# Patient Record
Sex: Male | Born: 1970 | Hispanic: Yes | Smoking: Former smoker
Health system: Southern US, Community
[De-identification: ages and names within clinical notes are randomized; demographics above are authoritative.]

## PROBLEM LIST (undated history)

## (undated) ENCOUNTER — Emergency Department (HOSPITAL_COMMUNITY): Payer: Self-pay | Source: Home / Self Care

## (undated) DIAGNOSIS — B999 Unspecified infectious disease: Secondary | ICD-10-CM

## (undated) DIAGNOSIS — M86272 Subacute osteomyelitis, left ankle and foot: Secondary | ICD-10-CM

## (undated) DIAGNOSIS — M869 Osteomyelitis, unspecified: Secondary | ICD-10-CM

## (undated) DIAGNOSIS — E119 Type 2 diabetes mellitus without complications: Secondary | ICD-10-CM

## (undated) HISTORY — DX: Subacute osteomyelitis, left ankle and foot: M86.272

---
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
## 2002-05-15 ENCOUNTER — Encounter: Payer: Self-pay | Admitting: *Deleted

## 2002-05-15 ENCOUNTER — Emergency Department (HOSPITAL_COMMUNITY): Admission: EM | Admit: 2002-05-15 | Discharge: 2002-05-16 | Payer: Self-pay | Admitting: Emergency Medicine

## 2002-06-25 ENCOUNTER — Encounter: Payer: Self-pay | Admitting: Chiropractor

## 2002-06-25 ENCOUNTER — Ambulatory Visit (HOSPITAL_COMMUNITY): Admission: RE | Admit: 2002-06-25 | Discharge: 2002-06-25 | Payer: Self-pay | Admitting: Chiropractor

## 2003-06-05 ENCOUNTER — Emergency Department (HOSPITAL_COMMUNITY): Admission: EM | Admit: 2003-06-05 | Discharge: 2003-06-05 | Payer: Self-pay | Admitting: Emergency Medicine

## 2018-07-29 ENCOUNTER — Ambulatory Visit (HOSPITAL_COMMUNITY)
Admission: EM | Admit: 2018-07-29 | Discharge: 2018-07-29 | Disposition: A | Discharge status: Home / Self Care | Payer: Self-pay | Attending: Family Medicine | Admitting: Family Medicine

## 2018-07-29 ENCOUNTER — Other Ambulatory Visit: Payer: Self-pay

## 2018-07-29 ENCOUNTER — Encounter (HOSPITAL_COMMUNITY): Payer: Self-pay

## 2018-07-29 ENCOUNTER — Ambulatory Visit (INDEPENDENT_AMBULATORY_CARE_PROVIDER_SITE_OTHER): Payer: Self-pay

## 2018-07-29 ENCOUNTER — Emergency Department (HOSPITAL_COMMUNITY): Payer: Medicaid Other

## 2018-07-29 ENCOUNTER — Inpatient Hospital Stay (HOSPITAL_COMMUNITY)
Admission: EM | Admit: 2018-07-29 | Discharge: 2018-08-01 | DRG: 617 | Disposition: A | Discharge status: Home Health | Payer: Medicaid Other | Attending: Internal Medicine | Admitting: Internal Medicine

## 2018-07-29 DIAGNOSIS — Z599 Problem related to housing and economic circumstances, unspecified: Secondary | ICD-10-CM | POA: Diagnosis not present

## 2018-07-29 DIAGNOSIS — E1165 Type 2 diabetes mellitus with hyperglycemia: Secondary | ICD-10-CM | POA: Diagnosis present

## 2018-07-29 DIAGNOSIS — Z833 Family history of diabetes mellitus: Secondary | ICD-10-CM | POA: Diagnosis not present

## 2018-07-29 DIAGNOSIS — E1169 Type 2 diabetes mellitus with other specified complication: Secondary | ICD-10-CM

## 2018-07-29 DIAGNOSIS — L97529 Non-pressure chronic ulcer of other part of left foot with unspecified severity: Secondary | ICD-10-CM

## 2018-07-29 DIAGNOSIS — Z794 Long term (current) use of insulin: Secondary | ICD-10-CM | POA: Diagnosis not present

## 2018-07-29 DIAGNOSIS — L97509 Non-pressure chronic ulcer of other part of unspecified foot with unspecified severity: Secondary | ICD-10-CM

## 2018-07-29 DIAGNOSIS — E11621 Type 2 diabetes mellitus with foot ulcer: Secondary | ICD-10-CM

## 2018-07-29 DIAGNOSIS — L0390 Cellulitis, unspecified: Secondary | ICD-10-CM

## 2018-07-29 DIAGNOSIS — M869 Osteomyelitis, unspecified: Secondary | ICD-10-CM | POA: Diagnosis present

## 2018-07-29 DIAGNOSIS — E1142 Type 2 diabetes mellitus with diabetic polyneuropathy: Secondary | ICD-10-CM | POA: Diagnosis present

## 2018-07-29 HISTORY — DX: Type 2 diabetes mellitus without complications: E11.9

## 2018-07-29 HISTORY — DX: Osteomyelitis, unspecified: M86.9

## 2018-07-29 LAB — CBC WITH DIFFERENTIAL/PLATELET
Abs Immature Granulocytes: 0.03 10*3/uL (ref 0.00–0.07)
Basophils Absolute: 0 10*3/uL (ref 0.0–0.1)
Basophils Relative: 0 %
Eosinophils Absolute: 0.1 10*3/uL (ref 0.0–0.5)
Eosinophils Relative: 2 %
HCT: 42.1 % (ref 39.0–52.0)
Hemoglobin: 14 g/dL (ref 13.0–17.0)
Immature Granulocytes: 0 %
Lymphocytes Relative: 16 %
Lymphs Abs: 1.3 10*3/uL (ref 0.7–4.0)
MCH: 29.9 pg (ref 26.0–34.0)
MCHC: 33.3 g/dL (ref 30.0–36.0)
MCV: 89.8 fL (ref 80.0–100.0)
Monocytes Absolute: 0.6 10*3/uL (ref 0.1–1.0)
Monocytes Relative: 8 %
Neutro Abs: 5.8 10*3/uL (ref 1.7–7.7)
Neutrophils Relative %: 74 %
PLATELETS: 251 10*3/uL (ref 150–400)
RBC: 4.69 MIL/uL (ref 4.22–5.81)
RDW: 11.9 % (ref 11.5–15.5)
WBC: 7.8 10*3/uL (ref 4.0–10.5)
nRBC: 0 % (ref 0.0–0.2)

## 2018-07-29 LAB — URINALYSIS, ROUTINE W REFLEX MICROSCOPIC
Bilirubin Urine: NEGATIVE
Glucose, UA: >=500 mg/dL — AB
Hgb urine dipstick: NEGATIVE
Ketones, ur: NEGATIVE mg/dL
Leukocytes,Ua: NEGATIVE
Nitrite: NEGATIVE
Protein, ur: NEGATIVE mg/dL
Specific Gravity, Urine: 1.031 — ABNORMAL HIGH (ref 1.005–1.030)
pH: 6 (ref 5.0–8.0)

## 2018-07-29 LAB — COMPREHENSIVE METABOLIC PANEL
ALT: 17 U/L (ref 0–44)
AST: 14 U/L — AB (ref 15–41)
Albumin: 3.9 g/dL (ref 3.5–5.0)
Alkaline Phosphatase: 88 U/L (ref 38–126)
Anion gap: 9 (ref 5–15)
BUN: 9 mg/dL (ref 6–20)
CO2: 27 mmol/L (ref 22–32)
CREATININE: 0.58 mg/dL — AB (ref 0.61–1.24)
Calcium: 9.2 mg/dL (ref 8.9–10.3)
Chloride: 101 mmol/L (ref 98–111)
GFR calc Af Amer: >60 mL/min (ref >60)
GFR calc non Af Amer: >60 mL/min (ref >60)
Glucose, Bld: 172 mg/dL — ABNORMAL HIGH (ref 70–99)
Potassium: 3.8 mmol/L (ref 3.5–5.1)
Sodium: 137 mmol/L (ref 135–145)
Total Bilirubin: 0.4 mg/dL (ref 0.3–1.2)
Total Protein: 8 g/dL (ref 6.5–8.1)

## 2018-07-29 LAB — HEMOGLOBIN A1C
Hgb A1c MFr Bld: 11.1 % — ABNORMAL HIGH (ref 4.8–5.6)
Mean Plasma Glucose: 271.87 mg/dL

## 2018-07-29 LAB — GLUCOSE, CAPILLARY
Glucose-Capillary: 147 mg/dL — ABNORMAL HIGH (ref 70–99)
Glucose-Capillary: 185 mg/dL — ABNORMAL HIGH (ref 70–99)

## 2018-07-29 LAB — CBG MONITORING, ED: Glucose-Capillary: 139 mg/dL — ABNORMAL HIGH (ref 70–99)

## 2018-07-29 MED ORDER — ONDANSETRON HCL 4 MG/2ML IJ SOLN
4.0000 mg | Freq: Four times a day (QID) | INTRAMUSCULAR | Status: DC | PRN
  Administered 2018-07-31: 4 mg via INTRAVENOUS

## 2018-07-29 MED ORDER — ACETAMINOPHEN 325 MG PO TABS: 650.0000 mg | ORAL_TABLET | Freq: Four times a day (QID) | ORAL | Status: DC | PRN

## 2018-07-29 MED ORDER — POLYETHYLENE GLYCOL 3350 17 G PO PACK: 17.0000 g | PACK | Freq: Every day | ORAL | Status: DC | PRN

## 2018-07-29 MED ORDER — ONDANSETRON HCL 4 MG PO TABS: 4.0000 mg | ORAL_TABLET | Freq: Four times a day (QID) | ORAL | Status: DC | PRN

## 2018-07-29 MED ORDER — LIVING WELL WITH DIABETES BOOK - IN SPANISH: Freq: Once | Status: DC

## 2018-07-29 MED ORDER — ACETAMINOPHEN 650 MG RE SUPP: 650.0000 mg | Freq: Four times a day (QID) | RECTAL | Status: DC | PRN

## 2018-07-29 MED ORDER — FLUTICASONE PROPIONATE 50 MCG/ACT NA SUSP
2.0000 | Freq: Every day | NASAL | Status: DC
  Administered 2018-07-29: 2 via NASAL
  Filled 2018-07-29: qty 16

## 2018-07-29 MED ORDER — ENOXAPARIN SODIUM 40 MG/0.4ML ~~LOC~~ SOLN
40.0000 mg | SUBCUTANEOUS | Status: DC
  Administered 2018-07-29 – 2018-07-31 (×3): 40 mg via SUBCUTANEOUS
  Filled 2018-07-29 (×3): qty 0.4

## 2018-07-29 MED ORDER — INSULIN ASPART 100 UNIT/ML ~~LOC~~ SOLN
0.0000 [IU] | Freq: Every day | SUBCUTANEOUS | Status: DC
  Administered 2018-07-30 – 2018-07-31 (×2): 2 [IU] via SUBCUTANEOUS

## 2018-07-29 MED ORDER — INSULIN ASPART 100 UNIT/ML ~~LOC~~ SOLN
0.0000 [IU] | Freq: Three times a day (TID) | SUBCUTANEOUS | Status: DC
  Administered 2018-07-29: 2 [IU] via SUBCUTANEOUS
  Administered 2018-07-30: 15 [IU] via SUBCUTANEOUS
  Administered 2018-07-30: 2 [IU] via SUBCUTANEOUS
  Administered 2018-07-30 – 2018-07-31 (×2): 5 [IU] via SUBCUTANEOUS
  Administered 2018-07-31: 2 [IU] via SUBCUTANEOUS
  Administered 2018-08-01 (×2): 3 [IU] via SUBCUTANEOUS

## 2018-07-29 NOTE — ED Notes (Signed)
Pt request through interpretor that he see a Child psychotherapist. Will inform same.

## 2018-07-29 NOTE — ED Notes (Signed)
Unable to palpate DP pulse on left foot. Able to obtain DP with doppler. Tib palpable on left

## 2018-07-29 NOTE — ED Triage Notes (Signed)
Pt arrives and c/o infection at left great toe. Sent from UC with xray showing osteomylitis.

## 2018-07-29 NOTE — ED Notes (Signed)
VAs [place] at  Bedside

## 2018-07-29 NOTE — ED Notes (Signed)
ED TO INPATIENT HANDOFF REPORT  ED Nurse Name and Phone #: Aldean Jewett [phone]  S Name/Age/Gender Jon House 48 y.o. male Room/Bed: 021C/021C  Code Status   Code Status: Not on file  Home/SNF/Other Home Patient oriented to: self, place, time and situation Is this baseline? Yes   Triage Complete: Triage complete  Chief Complaint foot ulcer/diabetic/sent by dr  Triage Note Pt arrives and c/o infection at left great toe. Sent from UC with xray showing osteomylitis.   Allergies No Known Allergies  Level of Care/Admitting Diagnosis ED Disposition    ED Disposition Condition Comment   Admit  The patient appears reasonably stabilized for admission considering the current resources, flow, and capabilities available in the ED at this time, and I doubt any other [facility] requiring further screening and/or treatment in the ED prior to admission is  present.       B Medical/Surgery History Past Medical History:  Diagnosis Date  . Diabetes mellitus without complication (HCC)    History reviewed. No pertinent surgical history.   A IV Location/Drains/Wounds Patient Lines/Drains/Airways Status   Active Line/Drains/Airways    Name:   Placement date:   Placement time:   Site:   Days:   Peripheral IV 07/29/18 Right Antecubital   07/29/18    1102    Antecubital   less than 1          Intake/Output Last 24 hours No intake or output data in the 24 hours ending 07/29/18 1335  Labs/Imaging Results for orders placed or performed during the hospital encounter of 07/29/18 (from the past 48 hour(s))  CBG monitoring, ED     Status: Abnormal   Collection Time: 07/29/18 10:45 AM  Result Value Ref Range   Glucose-Capillary 139 (H) 70 - 99 mg/dL   Comment 1 Notify RN    Comment 2 Document in Chart   CBC with Differential/Platelet     Status: None   Collection Time: 07/29/18 11:13 AM  Result Value Ref Range   WBC 7.8 4.0 - 10.5 K/uL   RBC 4.69 4.22 - 5.81 MIL/uL   Hemoglobin  14.0 13.0 - 17.0 g/dL   HCT 06.0 15.6 - 15.3 %   MCV 89.8 80.0 - 100.0 fL   MCH 29.9 26.0 - 34.0 pg   MCHC 33.3 30.0 - 36.0 g/dL   RDW 79.4 32.7 - 61.4 %   Platelets 251 150 - 400 K/uL   nRBC 0.0 0.0 - 0.2 %   Neutrophils Relative % 74 %   Neutro Abs 5.8 1.7 - 7.7 K/uL   Lymphocytes Relative 16 %   Lymphs Abs 1.3 0.7 - 4.0 K/uL   Monocytes Relative 8 %   Monocytes Absolute 0.6 0.1 - 1.0 K/uL   Eosinophils Relative 2 %   Eosinophils Absolute 0.1 0.0 - 0.5 K/uL   Basophils Relative 0 %   Basophils Absolute 0.0 0.0 - 0.1 K/uL   Immature Granulocytes 0 %   Abs Immature Granulocytes 0.03 0.00 - 0.07 K/uL    Comment: Performed at [facility] : [facility], [address] [place], [place] [address]  Comprehensive metabolic panel     Status: Abnormal   Collection Time: 07/29/18 11:13 AM  Result Value Ref Range   Sodium 137 135 - 145 mmol/L   Potassium 3.8 3.5 - 5.1 mmol/L   Chloride 101 98 - 111 mmol/L   CO2 27 22 - 32 mmol/L   Glucose, Bld 172 (H) 70 - 99 mg/dL   BUN  9 6 - 20 mg/dL   Creatinine, Ser 0.25 (L) 0.61 - 1.24 mg/dL   Calcium 9.2 8.9 - 85.2 mg/dL   Total Protein 8.0 6.5 - 8.1 g/dL   Albumin 3.9 3.5 - 5.0 g/dL   AST 14 (L) 15 - 41 U/L   ALT 17 0 - 44 U/L   Alkaline Phosphatase 88 38 - 126 U/L   Total Bilirubin 0.4 0.3 - 1.2 mg/dL   GFR calc non Af Amer >60 >60 mL/min   GFR calc Af Amer >60 >60 mL/min   Anion gap 9 5 - 15    Comment: Performed at [facility], [address], [place], [place] [address]  Urinalysis, Routine w reflex microscopic     Status: Abnormal   Collection Time: 07/29/18 11:59 AM  Result Value Ref Range   Color, Urine YELLOW YELLOW   APPearance CLEAR CLEAR   Specific Gravity, Urine 1.031 (H) 1.005 - 1.030   pH 6.0 5.0 - 8.0   Glucose, UA >=500 (A) NEGATIVE mg/dL   Hgb urine dipstick NEGATIVE NEGATIVE   Bilirubin Urine NEGATIVE NEGATIVE   Ketones, ur NEGATIVE NEGATIVE mg/dL   Protein, ur NEGATIVE NEGATIVE mg/dL   Nitrite NEGATIVE NEGATIVE    Leukocytes,Ua NEGATIVE NEGATIVE   RBC / HPF 0-5 0 - 5 RBC/hpf   WBC, UA 0-5 0 - 5 WBC/hpf   Bacteria, UA RARE (A) NONE SEEN   Mucus PRESENT    Hyaline Casts, UA PRESENT     Comment: Performed at [facility], [address] [place], [place] [address]   Dg Foot Complete Left  Result Date: 07/29/2018 CLINICAL DATA:  Diabetic.  Ten ulceration EXAM: LEFT FOOT - COMPLETE 3+ VIEW COMPARISON:  None. FINDINGS: Severe cortical erosion involving a large portion of the distal phalanx first digit. The articular surface of the first distal phalanx does not appear involved. The proximal phalanx of the first digit appears intact without evidence of osseous erosion. Soft tissue ulceration at the tip of the first digit. IMPRESSION: Osteomyelitis of the distal phalanx first digit. Electronically Signed   By: Genevive Bi M.D.   On: 07/29/2018 10:01    Pending Labs Unresulted Labs (From admission, onward)   None      Vitals/Pain Today's Vitals   07/29/18 1300 07/29/18 1315 07/29/18 1330 07/29/18 1334  BP: 120/71 128/73 135/66   Pulse: 88 84 87   Resp:  18 16   Temp:      TempSrc:      SpO2: 97% 100% 100%   Weight:      Height:      PainSc:    6     Isolation Precautions No active isolations  Medications Medications - No data to display  Mobility walks Low fall risk   Focused Assessments musculoskelatal   R Recommendations: See Admitting Provider Note  Report given to:   Additional Notes:

## 2018-07-29 NOTE — ED Provider Notes (Signed)
[facility] EMERGENCY DEPARTMENT Provider Note   CSN: [id number] Arrival date & time: 07/29/18  1035    History   Chief Complaint Chief Complaint  Patient presents with  . Foot Pain    [name] is a 48 y.o. male.     Patient sent from urgent care with suspicion for right great toe osteomyelitis noted on x-ray.  Patient is Spanish-speaking.  Telephone interpreter used.  Patient states that he has had a wound on his toe for approximately 6 months.  He states that he cut it at one point and the wound healed.  The area has gradually become worse with more significant pain and swelling over the past 3 days.  Pain goes from the toe up to the ankle.  He denies any fevers, chest pain, shortness of breath.  No nausea or vomiting.  Patient states that he sees a clinic to get diabetes medications but the clinic is currently closed.  He is unable to tell me what medications he takes.  He denies drainage from the toe.  Onset of symptoms insidious.  Course is worsening.  Nothing makes symptoms better.     Past Medical History:  Diagnosis Date  . Diabetes mellitus without complication (HCC)     There are no active problems to display for this patient.   History reviewed. No pertinent surgical history.      Home Medications    Prior to Admission medications   Not on File    Family History Family History  Problem Relation Age of Onset  . Diabetes Mother     Social History Social History   Tobacco Use  . Smoking status: Never Smoker  . Smokeless tobacco: Never Used  Substance Use Topics  . Alcohol use: Yes    Comment: sometimes  . Drug use: Never     Allergies   Patient has no known allergies.   Review of Systems Review of Systems  Constitutional: Negative for fever.  HENT: Negative for rhinorrhea and sore throat.   Eyes: Negative for redness.  Respiratory: Negative for cough.   Cardiovascular: Positive for leg swelling.  Negative for chest pain.  Gastrointestinal: Negative for abdominal pain, diarrhea, nausea and vomiting.  Genitourinary: Negative for dysuria.  Musculoskeletal: Positive for arthralgias, joint swelling and myalgias.  Skin: Negative for rash.  Neurological: Negative for headaches.     Physical Exam Updated Vital Signs BP 129/87 (BP Location: Right Arm)   Pulse 91   Temp 98.5 F (36.9 C) (Oral)   Resp 18   Ht 5' 6.93" (1.7 m)   Wt 75.8 kg   SpO2 99%   BMI 26.23 kg/m   Physical Exam Vitals signs and nursing note reviewed.  Constitutional:      Appearance: He is well-developed.  HENT:     Head: Normocephalic and atraumatic.  Eyes:     General:        Right eye: No discharge.        Left eye: No discharge.     Conjunctiva/sclera: Conjunctivae normal.  Neck:     Musculoskeletal: Normal range of motion and neck supple.  Cardiovascular:     Rate and Rhythm: Normal rate and regular rhythm.     Pulses:          Dorsalis pedis pulses are 2+ on the right side and detected w/ Doppler on the left side.     Heart sounds: Normal heart sounds.  Pulmonary:  Effort: Pulmonary effort is normal.     Breath sounds: Normal breath sounds.  Abdominal:     Palpations: Abdomen is soft.     Tenderness: There is no abdominal tenderness.  Skin:    General: Skin is warm and dry.     Comments: Patient with irregular ulceration to the end of the left great toe.  It is dry at the current point.  There is swelling of the great toe and foot extending onto the ankle.  Skin is warm.  Neurological:     Mental Status: He is alert.      ED Treatments / Results  Labs (all labs ordered are listed, but only abnormal results are displayed) Labs Reviewed  COMPREHENSIVE METABOLIC PANEL - Abnormal; Notable for the following components:      Result Value   Glucose, Bld 172 (*)    Creatinine, Ser 0.58 (*)    AST 14 (*)    All other components within normal limits  URINALYSIS, ROUTINE W REFLEX  MICROSCOPIC - Abnormal; Notable for the following components:   Specific Gravity, Urine 1.031 (*)    Glucose, UA >=500 (*)    Bacteria, UA RARE (*)    All other components within normal limits  CBG MONITORING, ED - Abnormal; Notable for the following components:   Glucose-Capillary 139 (*)    All other components within normal limits  CBC WITH DIFFERENTIAL/PLATELET    EKG None  Radiology Dg Foot Complete Left  Result Date: 07/29/2018 CLINICAL DATA:  Diabetic.  Ten ulceration EXAM: LEFT FOOT - COMPLETE 3+ VIEW COMPARISON:  None. FINDINGS: Severe cortical erosion involving a large portion of the distal phalanx first digit. The articular surface of the first distal phalanx does not appear involved. The proximal phalanx of the first digit appears intact without evidence of osseous erosion. Soft tissue ulceration at the tip of the first digit. IMPRESSION: Osteomyelitis of the distal phalanx first digit. Electronically Signed   By: Genevive Bi M.D.   On: 07/29/2018 10:01    Procedures Procedures (including critical care time)  Medications Ordered in ED Medications - No data to display   Initial Impression / Assessment and Plan / ED Course  I have reviewed the triage vital signs and the nursing notes.  Pertinent labs & imaging results that were available during my care of the patient were reviewed by me and considered in my medical decision making (see chart for details).        Patient seen and examined. Work-up initiated.    Vital signs reviewed and are as follows: BP 129/87 (BP Location: Right Arm)   Pulse 91   Temp 98.5 F (36.9 C) (Oral)   Resp 18   Ht 5' 6.93" (1.7 m)   Wt 75.8 kg   SpO2 99%   BMI 26.23 kg/m   Spoke with Ortho PA Tinnie Gens who will see patient.   Plan on unassigned admission when labs return.  Discussed patient with Dr. Criss Alvine.  X-ray personally reviewed.  12:35 PM Spoke with IMTS who will see patient.   Final Clinical Impressions(s) / ED  Diagnoses   Final diagnoses:  Osteomyelitis of great toe of left foot (HCC)   Admit for osteomyelitis and work-up, IM and ortho reccs.   ED Discharge Orders    None       Renne Crigler, Cordelia Poche 07/29/18 1236    Pricilla Loveless, MD 07/29/18 [phone]

## 2018-07-29 NOTE — ED Notes (Signed)
Attempted to call report

## 2018-07-29 NOTE — Consult Note (Signed)
Reason for Consult:Right great toe osteo Referring Physician: S Venson House is an 48 y.o. male.  HPI: Jon House comes to the ED with a 3d hx/o right great toe pain and swelling. He has been dealing with an ulceration for 5-6 months. He denies fevers, chills, sweats, N/V, or previous similar e/o. He is diabetic and his CBG'Jon run in the upper 100'Jon. He is Spanish-speaking and visit conducted with interpreter.  Past Medical History:  Diagnosis Date  . Diabetes mellitus without complication (HCC)     History reviewed. No pertinent surgical history.  Family History  Problem Relation Age of Onset  . Diabetes Mother     Social History:  reports that he has never smoked. He has never used smokeless tobacco. He reports current alcohol use. He reports that he does not use drugs.  Allergies: No Known Allergies  Medications: I have reviewed the patient'Jon current medications.  Results for orders placed or performed during the hospital encounter of 07/29/18 (from the past 48 hour(Jon))  CBG monitoring, ED     Status: Abnormal   Collection Time: 07/29/18 10:45 AM  Result Value Ref Range   Glucose-Capillary 139 (H) 70 - 99 mg/dL   Comment 1 Notify RN    Comment 2 Document in Chart   CBC with Differential/Platelet     Status: None   Collection Time: 07/29/18 11:13 AM  Result Value Ref Range   WBC 7.8 4.0 - 10.5 K/uL   RBC 4.69 4.22 - 5.81 MIL/uL   Hemoglobin 14.0 13.0 - 17.0 g/dL   HCT 76.1 95.0 - 93.2 %   MCV 89.8 80.0 - 100.0 fL   MCH 29.9 26.0 - 34.0 pg   MCHC 33.3 30.0 - 36.0 g/dL   RDW 67.1 24.5 - 80.9 %   Platelets 251 150 - 400 K/uL   nRBC 0.0 0.0 - 0.2 %   Neutrophils Relative % 74 %   Neutro Abs 5.8 1.7 - 7.7 K/uL   Lymphocytes Relative 16 %   Lymphs Abs 1.3 0.7 - 4.0 K/uL   Monocytes Relative 8 %   Monocytes Absolute 0.6 0.1 - 1.0 K/uL   Eosinophils Relative 2 %   Eosinophils Absolute 0.1 0.0 - 0.5 K/uL   Basophils Relative 0 %   Basophils Absolute 0.0 0.0 -  0.1 K/uL   Immature Granulocytes 0 %   Abs Immature Granulocytes 0.03 0.00 - 0.07 K/uL    Comment: Performed at [facility], [address], [place], [place] [address]    Dg Foot Complete Left  Result Date: 07/29/2018 CLINICAL DATA:  Diabetic.  Ten ulceration EXAM: LEFT FOOT - COMPLETE 3+ VIEW COMPARISON:  None. FINDINGS: Severe cortical erosion involving a large portion of the distal phalanx first digit. The articular surface of the first distal phalanx does not appear involved. The proximal phalanx of the first digit appears intact without evidence of osseous erosion. Soft tissue ulceration at the tip of the first digit. IMPRESSION: Osteomyelitis of the distal phalanx first digit. Electronically Signed   By: Genevive Bi M.D.   On: 07/29/2018 10:01    Review of Systems  Constitutional: Negative for chills, fever and weight loss.  HENT: Negative for ear discharge, ear pain, hearing loss and tinnitus.   Eyes: Negative for blurred vision, double vision, photophobia and pain.  Respiratory: Negative for cough, sputum production and shortness of breath.   Cardiovascular: Negative for chest pain.  Gastrointestinal: Negative for abdominal pain, nausea and vomiting.  Genitourinary: Negative for dysuria, flank pain, frequency and urgency.  Musculoskeletal: Positive for joint pain (Right foot). Negative for back pain, falls, myalgias and neck pain.  Neurological: Negative for dizziness, tingling, sensory change, focal weakness, loss of consciousness and headaches.  Endo/Heme/Allergies: Does not bruise/bleed easily.  Psychiatric/Behavioral: Negative for depression, memory loss and substance abuse. The patient is not nervous/anxious.    Blood pressure 129/87, pulse 91, temperature 98.5 F (36.9 C), temperature source Oral, resp. rate 18, height 5' 6.93" (1.7 m), weight 75.8 kg, SpO2 99 %. Physical Exam  Constitutional: He appears well-developed and well-nourished. No distress.  HENT:   Head: Normocephalic and atraumatic.  Eyes: Conjunctivae are normal. Right eye exhibits no discharge. Left eye exhibits no discharge. No scleral icterus.  Neck: Normal range of motion.  Cardiovascular: Normal rate and regular rhythm.  Respiratory: Effort normal. No respiratory distress.  Musculoskeletal:     Comments: RLE No traumatic wounds, ecchymosis, or rash  Great toe fusiform edema, tip ulceration  No knee or ankle effusion  Knee stable to varus/ valgus and anterior/posterior stress  Sens DPN, SPN, TN intact  Motor EHL, ext, flex, evers 5/5  DP 0 (dopplerable), PT 2+, No significant edema  Neurological: He is alert.  Skin: Skin is warm and dry. He is not diaphoretic.  Psychiatric: He has a normal mood and affect. His behavior is normal.    Assessment/Plan: Right great toe osteo -- Will need amputation, likely Wednesday with Dr. Lajoyce Corners. Will get ABI'Jon, may need vascular input if impaired. DM    Freeman Caldron, PA-C Orthopedic Surgery [phone] 07/29/2018, 11:39 AM

## 2018-07-29 NOTE — ED Provider Notes (Addendum)
[facility] CARE [facility]   [phone] 07/29/18 Arrival Time: 3734  ASSESSMENT & PLAN:  1. Type 2 diabetes mellitus with foot ulcer, without long-term current use of insulin (HCC)   2. Diabetic osteomyelitis (HCC)    I have personally viewed the imaging studies ordered this visit. Bone erosion consistent with osteomyelitis.   I have offered to call vascular surgery to see if they can evaluation him promptly. After discussion, will send to the ED for evaluation and to consider admission. No insurance.   Reviewed expectations re: course of current medical issues. Questions answered. Outlined signs and symptoms indicating need for more acute intervention. Patient verbalized understanding. After Visit Summary given.  SUBJECTIVE: History from: patient. Video Spanish interpreter used.  Jon House is a 48 y.o. male with DM (does not know what medications he has been prescribed) who reports an "infection" of his L great toe. Has been present over several months he thinks. Reports cutting his toe several months ago; healed; "but infection has returned" this week. " Reports seeing another healthcare provider who placed him on PCN. Has been taking without change in his symptoms. No significant pain. Occasional and slight drainage from toe. Ambulatory without difficulty. Aggravating factors: none identified. Alleviating factors: none identified. Associated symptoms: none reported.  History reviewed. No pertinent surgical history.   ROS: As per HPI.   OBJECTIVE:  Vitals:   07/29/18 0856  BP: 121/76  Resp: 18  Temp: 98.2 F (36.8 C)  TempSrc: Oral  SpO2: 99%  Weight: 75.8 kg    General appearance: alert; no distress Extremities:  LLE: great toe is swollen and soft; missing nail; at tip of toe is an irregular, approx 1cm ulceration without active bleeding or drainage; no significant tenderness; very slight skin erythema over big toe; no foot swelling; great toe ROM: normal  without reported discomfort; otherwise L foot appears normal except for mild swelling when compared to R foot CV: brisk extremity capillary refill of RLE and LLE; 2+ DP pulse of RLE; I cannot palpate a DP pulse of LLE Skin: warm and dry; no visible rashes Neurologic: gait normal; normal reflexes of RLE and LLE; overall decreased distal sensation of RLE and LLE; normal strength of RLE and LLE Psychological: alert and cooperative; normal mood and affect  No Known Allergies  Past Medical History:  Diagnosis Date   Diabetes mellitus without complication (HCC)    Social History   Socioeconomic History   Marital status: Married    Spouse name: Not on file   Number of children: Not on file   Years of education: Not on file   Highest education level: Not on file  Occupational History   Not on file  Social Needs   Financial resource strain: Not on file   Food insecurity:    Worry: Not on file    Inability: Not on file   Transportation needs:    Medical: Not on file    Non-medical: Not on file  Tobacco Use   Smoking status: Never Smoker   Smokeless tobacco: Never Used  Substance and Sexual Activity   Alcohol use: Yes   Drug use: Never   Sexual activity: Not on file  Lifestyle   Physical activity:    Days per week: Not on file    Minutes per session: Not on file   Stress: Not on file  Relationships   Social connections:    Talks on phone: Not on file    Gets together: Not on file  Attends religious service: Not on file    Active member of club or organization: Not on file    Attends meetings of clubs or organizations: Not on file    Relationship status: Not on file  Other Topics Concern   Not on file  Social History Narrative   Not on file   Family History  Problem Relation Age of Onset   Diabetes Mother    History reviewed. No pertinent surgical history.    Mardella Layman, MD 07/29/18 1356    Mardella Layman, MD 07/29/18 [phone]

## 2018-07-29 NOTE — H&P (View-Only) (Signed)
° ° °ORTHOPAEDIC CONSULTATION ° °REQUESTING PHYSICIAN: Hoffman, Erik C, DO ° °Chief Complaint: Osteomyelitis ulceration left great toe. ° °HPI: °Jon House is a 48 y.o. male who presents with chronic osteomyelitis ulceration left great toe.  Patient has uncontrolled type 2 diabetes.  Patient was seen with Josephine as the interpreter. ° °Past Medical History:  °Diagnosis Date  °• Diabetes mellitus without complication (HCC)   ° °History reviewed. No pertinent surgical history. °Social History  ° °Socioeconomic History  °• Marital status: Married  °  Spouse name: Not on file  °• Number of children: Not on file  °• Years of education: Not on file  °• Highest education level: Not on file  °Occupational History  °• Not on file  °Social Needs  °• Financial resource strain: Not on file  °• Food insecurity:  °  Worry: Not on file  °  Inability: Not on file  °• Transportation needs:  °  Medical: Not on file  °  Non-medical: Not on file  °Tobacco Use  °• Smoking status: Never Smoker  °• Smokeless tobacco: Never Used  °Substance and Sexual Activity  °• Alcohol use: Yes  °  Comment: sometimes  °• Drug use: Never  °• Sexual activity: Not on file  °Lifestyle  °• Physical activity:  °  Days per week: Not on file  °  Minutes per session: Not on file  °• Stress: Not on file  °Relationships  °• Social connections:  °  Talks on phone: Not on file  °  Gets together: Not on file  °  Attends religious service: Not on file  °  Active member of club or organization: Not on file  °  Attends meetings of clubs or organizations: Not on file  °  Relationship status: Not on file  °Other Topics Concern  °• Not on file  °Social History Narrative  °• Not on file  ° °Family History  °Problem Relation Age of Onset  °• Diabetes Mother   ° °- negative except otherwise stated in the family history section °No Known Allergies °Prior to Admission medications   °Not on File  ° °Dg Foot Complete Left ° °Result Date: 07/29/2018 °CLINICAL DATA:   Diabetic.  Ten ulceration EXAM: LEFT FOOT - COMPLETE 3+ VIEW COMPARISON:  None. FINDINGS: Severe cortical erosion involving a large portion of the distal phalanx first digit. The articular surface of the first distal phalanx does not appear involved. The proximal phalanx of the first digit appears intact without evidence of osseous erosion. Soft tissue ulceration at the tip of the first digit. IMPRESSION: Osteomyelitis of the distal phalanx first digit. Electronically Signed   By: Stewart  Edmunds M.D.   On: 07/29/2018 10:01  ° °Vas Us Abi With/wo Tbi ° °Result Date: 07/29/2018 °LOWER EXTREMITY DOPPLER STUDY Indications: Ulceration. Left great toe  Performing Technologist: Slaughter, [place] RVS  Examination Guidelines: A complete evaluation includes at minimum, Doppler waveform signals and systolic blood pressure reading at the level of bilateral brachial, anterior tibial, and posterior tibial arteries, when vessel segments are accessible. Bilateral testing is considered an integral part of a complete examination. Photoelectric Plethysmograph (PPG) waveforms and toe systolic pressure readings are included as required and additional duplex testing as needed. Limited examinations for reoccurring indications may be performed as noted.  ABI Findings: +--------+------------------+-----+---------+--------+  Right    Rt Pressure (mmHg) Index Waveform  Comment   +--------+------------------+-----+---------+--------+  Brachial 130                        triphasic           +--------+------------------+-----+---------+--------+  PTA      151                1.16  triphasic           +--------+------------------+-----+---------+--------+  DP       154                1.18  triphasic           +--------+------------------+-----+---------+--------+ +--------+------------------+-----+---------+-------+  Left     Lt Pressure (mmHg) Index Waveform  Comment  +--------+------------------+-----+---------+-------+  Brachial 120                       triphasic          +--------+------------------+-----+---------+-------+  PTA      157                1.21  triphasic          +--------+------------------+-----+---------+-------+  DP       143                1.10  triphasic          +--------+------------------+-----+---------+-------+  Summary: Right: Resting right ankle-brachial index is within normal range. No evidence of significant right lower extremity arterial disease. Left: Resting left ankle-brachial index is within normal range. No evidence of significant left lower extremity arterial disease.  *See table(s) above for measurements and observations.    Preliminary    - pertinent xrays, CT, MRI studies were reviewed and independently interpreted  Positive ROS: All other systems have been reviewed and were otherwise negative with the exception of those mentioned in the HPI and as above.  Physical Exam: General: Alert, no acute distress Psychiatric: Patient is competent for consent with normal mood and affect Lymphatic: No axillary or cervical lymphadenopathy Cardiovascular: No pedal edema Respiratory: No cyanosis, no use of accessory musculature GI: No organomegaly, abdomen is soft and non-tender    Images:  @ENCIMAGES @  Labs:  Lab Results  Component Value Date   HGBA1C 11.1 (H) 07/29/2018    Lab Results  Component Value Date   ALBUMIN 3.9 07/29/2018    Neurologic: Patient does not have protective sensation bilateral lower extremities.   MUSCULOSKELETAL:   Skin: Examination patient has sausage digit swelling of the left great toe with a chronic ulcer.  Patient has a good dorsalis pedis and posterior tibial pulse.  Ankle-brachial indices show triphasic flow with good circulation.  Radiographs shows chronic destruction of the tuft of the left great toe consistent with chronic osteomyelitis.  Patient has a hemoglobin A1c of 11.1 consistent with chronic uncontrolled diabetes.  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  Plan: Plan: Discussed with the patient that I will plan for surgery on Wednesday for an amputation of the left great toe through the MTP joint.  Risks and benefits were discussed including risk of the wound not healing.  Patient states he understands wished to proceed at this time.  I have placed an order for case management to assist patient with Medicaid insurance or other insurance options.  Thank you for the consult and the opportunity to see Mr. Valentina Shaggy, MD [facility] [phone] 4:28 PM

## 2018-07-29 NOTE — Consult Note (Signed)
ORTHOPAEDIC CONSULTATION  REQUESTING PHYSICIAN: Gust Rung, DO  Chief Complaint: Osteomyelitis ulceration left great toe.  HPI: Jon House is a 48 y.o. male who presents with chronic osteomyelitis ulceration left great toe.  Patient has uncontrolled type 2 diabetes.  Patient was seen with Julieanne Cotton as the interpreter.  Past Medical History:  Diagnosis Date   Diabetes mellitus without complication (HCC)    History reviewed. No pertinent surgical history. Social History   Socioeconomic History   Marital status: Married    Spouse name: Not on file   Number of children: Not on file   Years of education: Not on file   Highest education level: Not on file  Occupational History   Not on file  Social Needs   Financial resource strain: Not on file   Food insecurity:    Worry: Not on file    Inability: Not on file   Transportation needs:    Medical: Not on file    Non-medical: Not on file  Tobacco Use   Smoking status: Never Smoker   Smokeless tobacco: Never Used  Substance and Sexual Activity   Alcohol use: Yes    Comment: sometimes   Drug use: Never   Sexual activity: Not on file  Lifestyle   Physical activity:    Days per week: Not on file    Minutes per session: Not on file   Stress: Not on file  Relationships   Social connections:    Talks on phone: Not on file    Gets together: Not on file    Attends religious service: Not on file    Active member of club or organization: Not on file    Attends meetings of clubs or organizations: Not on file    Relationship status: Not on file  Other Topics Concern   Not on file  Social History Narrative   Not on file   Family History  Problem Relation Age of Onset   Diabetes Mother    - negative except otherwise stated in the family history section No Known Allergies Prior to Admission medications   Not on File   Dg Foot Complete Left  Result Date: 07/29/2018 CLINICAL DATA:   Diabetic.  Ten ulceration EXAM: LEFT FOOT - COMPLETE 3+ VIEW COMPARISON:  None. FINDINGS: Severe cortical erosion involving a large portion of the distal phalanx first digit. The articular surface of the first distal phalanx does not appear involved. The proximal phalanx of the first digit appears intact without evidence of osseous erosion. Soft tissue ulceration at the tip of the first digit. IMPRESSION: Osteomyelitis of the distal phalanx first digit. Electronically Signed   By: Genevive Bi M.D.   On: 07/29/2018 10:01   Vas [place] Vanice Sarah With/wo Tbi  Result Date: 07/29/2018 LOWER EXTREMITY DOPPLER STUDY Indications: Ulceration. Left great toe  Performing Technologist: Milta Deiters, [place] RVS  Examination Guidelines: A complete evaluation includes at minimum, Doppler waveform signals and systolic blood pressure reading at the level of bilateral brachial, anterior tibial, and posterior tibial arteries, when vessel segments are accessible. Bilateral testing is considered an integral part of a complete examination. Photoelectric Plethysmograph (PPG) waveforms and toe systolic pressure readings are included as required and additional duplex testing as needed. Limited examinations for reoccurring indications may be performed as noted.  ABI Findings: +--------+------------------+-----+---------+--------+  Right    Rt Pressure (mmHg) Index Waveform  Comment   +--------+------------------+-----+---------+--------+  Brachial 130  triphasic           +--------+------------------+-----+---------+--------+  PTA      151                1.16  triphasic           +--------+------------------+-----+---------+--------+  DP       154                1.18  triphasic           +--------+------------------+-----+---------+--------+ +--------+------------------+-----+---------+-------+  Left     Lt Pressure (mmHg) Index Waveform  Comment  +--------+------------------+-----+---------+-------+  Brachial 120                       triphasic          +--------+------------------+-----+---------+-------+  PTA      157                1.21  triphasic          +--------+------------------+-----+---------+-------+  DP       143                1.10  triphasic          +--------+------------------+-----+---------+-------+  Summary: Right: Resting right ankle-brachial index is within normal range. No evidence of significant right lower extremity arterial disease. Left: Resting left ankle-brachial index is within normal range. No evidence of significant left lower extremity arterial disease.  *See table(s) above for measurements and observations.    Preliminary    - pertinent xrays, CT, MRI studies were reviewed and independently interpreted  Positive ROS: All other systems have been reviewed and were otherwise negative with the exception of those mentioned in the HPI and as above.  Physical Exam: General: Alert, no acute distress Psychiatric: Patient is competent for consent with normal mood and affect Lymphatic: No axillary or cervical lymphadenopathy Cardiovascular: No pedal edema Respiratory: No cyanosis, no use of accessory musculature GI: No organomegaly, abdomen is soft and non-tender    Images:  @ENCIMAGES @  Labs:  Lab Results  Component Value Date   HGBA1C 11.1 (H) 07/29/2018    Lab Results  Component Value Date   ALBUMIN 3.9 07/29/2018    Neurologic: Patient does not have protective sensation bilateral lower extremities.   MUSCULOSKELETAL:   Skin: Examination patient has sausage digit swelling of the left great toe with a chronic ulcer.  Patient has a good dorsalis pedis and posterior tibial pulse.  Ankle-brachial indices show triphasic flow with good circulation.  Radiographs shows chronic destruction of the tuft of the left great toe consistent with chronic osteomyelitis.  Patient has a hemoglobin A1c of 11.1 consistent with chronic uncontrolled diabetes.  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  Plan: Plan: Discussed with the patient that I will plan for surgery on Wednesday for an amputation of the left great toe through the MTP joint.  Risks and benefits were discussed including risk of the wound not healing.  Patient states he understands wished to proceed at this time.  I have placed an order for case management to assist patient with Medicaid insurance or other insurance options.  Thank you for the consult and the opportunity to see Mr. Valentina Shaggy, MD [facility] [phone] 4:28 PM

## 2018-07-29 NOTE — Progress Notes (Addendum)
Bilateral ABIs completed. Preliminary results in Chart review CV Proc. [place] Elvira Langston,RVS 07/29/18, 1:38 pm

## 2018-07-29 NOTE — H&P (Signed)
Date: 07/29/2018               Patient Name:  Jon House MRN: [id number]  DOB: [birth year] Age / Sex: 48 y.o., male   PCP: Patient, No Pcp Per         Medical Service: Internal Medicine Teaching Service         Attending Physician: Dr. Gust Rung, DO    First Contact: Dr. Gwyneth Revels Pager: [phone]  Second Contact: Dr. Delma Officer Pager: [phone]       After Hours (After 5p/  First Contact Pager: [phone]  weekends / holidays): Second Contact Pager: [phone]   Chief Complaint: Left toe pain  History of Present Illness: This is a 48 year old spanish speaking male with a history of DM type 2 who presented with a  3 day history of worsening pain and swelling of his left big toe. Phone interpretor was used. The pain is over the left large toe and extends up to his ankle. He denies any trauma to the area, he has been able to walk on it with no issues however does report that it's painful to do this. He does work as a Music therapist outside. He reports that he had an infection about 6 months ago in his left foot and that it has never gotten better. He denies any fevers, chills, nausea, vomiting, abdominal pain, chest pain, shortness of breath, or other symptoms. He has been taking penicillin that he gets from [facility] [employer]. He does report that he had a PCP but that they closed, he reports that he was getting insulin from them. Reports that he takes 5-10 units of insulin twice a day but does not know what type he uses.  ED course: Noted to be afebrile, hemodynamically stable. CBC was unremarkable. CMP showed a elevated glucose to 172. U/A showed glucosuria to >500, no proteins. Left foot x-ray showed osteomyelitis of the distal phalanx of the first digit of the left foot. Orthopedics was consulted and they recommended ABIs and that he will need an amputation of the toe. Patient was admitted to internal medicine.   Meds:  No outpatient medications have been marked as taking for the 07/29/18  encounter [facility] Encounter).    Allergies: Allergies as of 07/29/2018  . (No Known Allergies)   Past Medical History:  Diagnosis Date  . Diabetes mellitus without complication (HCC)     Family History: Mother had DM, son has a leukemia. No other family history.   Social History: Denies any smoking or drug use. Endorses occasional EtOH use, a few beers per month. He works as a Music therapist, lives with his Wife and children. He has lived in [place] since 1999.   Review of Systems: A complete ROS was negative except as per HPI.   Physical Exam: Blood pressure 137/79, pulse 86, temperature 98.1 F (36.7 C), temperature source Oral, resp. rate 16, height 5' 6.93" (1.7 m), weight 75.8 kg, SpO2 100 %. Physical Exam  Constitutional: He is oriented to person, place, and time and well-developed, well-nourished, and in no distress.  HENT:  Head: Normocephalic and atraumatic.  Eyes: Pupils are equal, round, and reactive to light. Conjunctivae and EOM are normal.  Neck: Normal range of motion. Neck supple.  Cardiovascular: Normal rate, regular rhythm and normal heart sounds.  Pulmonary/Chest: Effort normal. No respiratory distress. He has no wheezes.  Abdominal: Soft. Bowel sounds are normal. He exhibits no distension.  Musculoskeletal: Normal range of motion.  Comments: Left foot: Dorsal aspect is warm, tender, and erythematous, 1st phalanx has ulceration on the distal aspect, no drainage from the area. Both feet has areas of thickened skin and calloses.   Neurological: He is alert and oriented to person, place, and time.  Skin: Skin is warm and dry.    Assessment & Plan by Problem: Active Problems:   Osteomyelitis of great toe of left foot (HCC)  Osteomyelitis of left 1st toe:  This is a 48 year old male with a history of DM type 2 on insulin who presented with worsening pain and swelling of his left foot. He had an infection in that area about 6 months ago and he reports that it  never completely healed. He is hemodynamically stable. Labs were significant only for a elevated glucose and glucosuria, he has no leukocytosis. X-ray showed osteomyelitis of the right phalanx of the 1st digit.This does not appear to be a systemic infection given his lack of fever and leukocytosis, we will hold off on antibiotics for now.  Orthopedics was consulted, they reported that he will need amputation, likely on Wednesday with Dr. Lajoyce Corners. His poor wound healing is possibly due to poorly controlled diabetes, he reported that he does have a PCP however they are closed at this time.  -Orthopedics following, appreciate recommendations -ABIs -Will hold off on antibiotics for now, if he develops fever or leukocytosis can start something -Tylenol PRN -CBC and BMP in AM -Carb mod diet  -CSW consult  DM: -Glucose elevated to 172 today, glucosuria of > 500. He is on insulin at home, he is not sure what type, but takes 5-10 units twice a day.  -Frequent CBGs -SSI-mod -Check A1c  FEN: No fluids, replete lytes prn, Carb mod diet  VTE ppx: Lovenox  Code Status: FULL    Dispo: Admit patient to Inpatient with expected length of stay greater than 2 midnights.  Signed: Claudean Severance, MD 07/29/2018, 2:58 PM  Pager: [phone]

## 2018-07-29 NOTE — ED Triage Notes (Signed)
Pt cc he has a toe nail problem on his left foot x 1 week. Pt states he has been taking  Penicillin.

## 2018-07-30 ENCOUNTER — Other Ambulatory Visit (INDEPENDENT_AMBULATORY_CARE_PROVIDER_SITE_OTHER): Payer: Self-pay | Admitting: Orthopedic Surgery

## 2018-07-30 DIAGNOSIS — E1165 Type 2 diabetes mellitus with hyperglycemia: Secondary | ICD-10-CM

## 2018-07-30 DIAGNOSIS — M869 Osteomyelitis, unspecified: Secondary | ICD-10-CM

## 2018-07-30 LAB — BASIC METABOLIC PANEL
Anion gap: 7 (ref 5–15)
BUN: 15 mg/dL (ref 6–20)
CO2: 31 mmol/L (ref 22–32)
Calcium: 9 mg/dL (ref 8.9–10.3)
Chloride: 99 mmol/L (ref 98–111)
Creatinine, Ser: 0.62 mg/dL (ref 0.61–1.24)
GFR calc Af Amer: >60 mL/min (ref >60)
GFR calc non Af Amer: >60 mL/min (ref >60)
Glucose, Bld: 167 mg/dL — ABNORMAL HIGH (ref 70–99)
Potassium: 3.8 mmol/L (ref 3.5–5.1)
Sodium: 137 mmol/L (ref 135–145)

## 2018-07-30 LAB — CBC
HCT: 38.4 % — ABNORMAL LOW (ref 39.0–52.0)
HCT: 41.2 % (ref 39.0–52.0)
Hemoglobin: 13.3 g/dL (ref 13.0–17.0)
Hemoglobin: 13.8 g/dL (ref 13.0–17.0)
MCH: 30.4 pg (ref 26.0–34.0)
MCH: 29.5 pg (ref 26.0–34.0)
MCHC: 34.6 g/dL (ref 30.0–36.0)
MCHC: 33.5 g/dL (ref 30.0–36.0)
MCV: 87.9 fL (ref 80.0–100.0)
MCV: 88 fL (ref 80.0–100.0)
PLATELETS: 269 10*3/uL (ref 150–400)
Platelets: 264 10*3/uL (ref 150–400)
RBC: 4.37 MIL/uL (ref 4.22–5.81)
RBC: 4.68 MIL/uL (ref 4.22–5.81)
RDW: 11.9 % (ref 11.5–15.5)
RDW: 11.9 % (ref 11.5–15.5)
WBC: 5.5 10*3/uL (ref 4.0–10.5)
WBC: 4.1 10*3/uL (ref 4.0–10.5)
nRBC: 0 % (ref 0.0–0.2)
nRBC: 0 % (ref 0.0–0.2)

## 2018-07-30 LAB — GLUCOSE, CAPILLARY
Glucose-Capillary: 357 mg/dL — ABNORMAL HIGH (ref 70–99)
Glucose-Capillary: 131 mg/dL — ABNORMAL HIGH (ref 70–99)
Glucose-Capillary: 213 mg/dL — ABNORMAL HIGH (ref 70–99)
Glucose-Capillary: 231 mg/dL — ABNORMAL HIGH (ref 70–99)
Glucose-Capillary: 202 mg/dL — ABNORMAL HIGH (ref 70–99)

## 2018-07-30 LAB — SURGICAL PCR SCREEN
MRSA, PCR: NEGATIVE
Staphylococcus aureus: POSITIVE — AB

## 2018-07-30 LAB — HIV ANTIBODY (ROUTINE TESTING W REFLEX): HIV SCREEN 4TH GENERATION: NONREACTIVE

## 2018-07-30 MED ORDER — CEFAZOLIN SODIUM-DEXTROSE 2-4 GM/100ML-% IV SOLN
2.0000 g | INTRAVENOUS | Status: AC
  Administered 2018-07-31: 2 g via INTRAVENOUS
  Filled 2018-07-30 (×2): qty 100

## 2018-07-30 MED ORDER — INSULIN ASPART PROT & ASPART (70-30 MIX) 100 UNIT/ML ~~LOC~~ SUSP
5.0000 [IU] | Freq: Two times a day (BID) | SUBCUTANEOUS | Status: DC
  Administered 2018-07-30: 5 [IU] via SUBCUTANEOUS
  Filled 2018-07-30: qty 10

## 2018-07-30 MED ORDER — INSULIN GLARGINE 100 UNIT/ML ~~LOC~~ SOLN
10.0000 [IU] | Freq: Every day | SUBCUTANEOUS | Status: DC
  Administered 2018-07-30: 10 [IU] via SUBCUTANEOUS
  Filled 2018-07-30 (×3): qty 0.1

## 2018-07-30 MED ORDER — CHLORHEXIDINE GLUCONATE 4 % EX LIQD: 60.0000 mL | Freq: Once | CUTANEOUS | Status: DC

## 2018-07-30 MED ORDER — INSULIN GLARGINE 100 UNIT/ML ~~LOC~~ SOLN: 10.0000 [IU] | Freq: Every day | SUBCUTANEOUS | Status: DC

## 2018-07-30 NOTE — Plan of Care (Signed)

## 2018-07-30 NOTE — Progress Notes (Signed)
   Subjective: Mr. Greer Ee was doing well today, no acute events overnight. He denies any leg pain, fevers, chills, or other symptoms. He reports that he slept well and is feeling well. We discussed that the plan is for him to have surgery tomorrow. We discussed that his blood sugars have been elevated and that his A1c was 11. He reported that he had been on oral medications in the past but that those were stopped and that he was just on 70/30 insulin twice a day either 5-10 units. We discussed the plan for today and he is in agreement.   Objective:  Vital signs in last 24 hours: Vitals:   07/29/18 1415 07/29/18 1456 07/29/18 2022 07/30/18 0420  BP: 136/82 137/79 105/62 116/72  Pulse: 90 86 79 79  Resp:  16 15 16   Temp:  98.1 F (36.7 C) 98.4 F (36.9 C) 98.4 F (36.9 C)  TempSrc:  Oral Oral Oral  SpO2: 100% 100% 98% 99%  Weight:      Height:        General: Well appearing, NAD, sitting comfortably in bed Cardiac: RRR, no m/r/g Pulmonary: CTABL, no wheezing or rhonchi Abdomen: Soft, non-tender, non-distended Extremity: Left foot 1st digit with ulceration on distal aspect, no drainage or oozing, minimal edema and warmth on dorsal aspect, erythema improved    Assessment/Plan:  Active Problems:   Osteomyelitis of great toe of left foot (HCC)     Diabetic polyneuropathy associated with type 2 diabetes mellitus (HCC)  Osteomyelitis of the left 1st digit: -Patient has remained afebrile, with no leukocytosis. Vitals have been stable. Erythema has improved, still has some edema and warmth on that area. ABIs were negative for vascular disease, no evidence of any clots. Orthopedics is recommending amputation and has it scheduled for tomorrow. He will likely have poor wound healing due to his uncontrolled diabetes, A1c was 11. 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  -Orthopedics following, appreciate recommendations -Planned for amputation tomorrow -Tylenol PRN -Daily CBC -Carb mod diet, NPO at [facility]  Diabetes type 2: A1c was 11.5, appears to have uncontrolled diabetes. He does take insulin 70/30 twice a day, either 5-10 units at a time. He was started on SSI-mod yesterday and received 2 units yesterday. CBGs have been mildly elevated. Will try to start him on lantus to minimize how often he gets injections on discharge. We will also likely start metformin on discharge to try to improve his glycemic control.  -Lantus 10 tonight, may need a higher dose but he received 1 dose of 5 units 70/30 insulin this morning -Continue SSI- mod -Frequent CBGs  FEN: No fluids, replete lytes prn, Carb mod diet, NPO at [facility] VTE ppx: Lovenox  Code Status: FULL    Dispo: Anticipated discharge is pending clinical improvement  Claudean Severance, MD 07/30/2018, 6:55 AM Pager: [phone]

## 2018-07-30 NOTE — Care Management (Signed)
Case manager left message requesting  for Financial counselor to follow up with patient concerning Medicaid.

## 2018-07-30 NOTE — Progress Notes (Addendum)
Inpatient Diabetes Program Recommendations  AACE/ADA: New Consensus Statement on Inpatient Glycemic Control (2015)  Target Ranges:  Prepandial:   less than 140 mg/dL      Peak postprandial:   less than 180 mg/dL (1-2 hours)      Critically ill patients:  140 - 180 mg/dL   Lab Results  Component Value Date   GLUCAP 357 (H) 07/30/2018   HGBA1C 11.1 (H) 07/29/2018    Review of Glycemic Control  Diabetes history: DM2  Outpatient Diabetes medications: 70/30 BID (either 5 or 10 units depending on CBG)  Current orders for Inpatient glycemic control: Received 70/30 this am (now d/c) and will start Lantus 10 units tonight                                                                          Novolog (0-15 units) tid and (0-5 units) hs   Via interpreter (in person - Ashby Dawes) spoke to patient about his Hgb A1c of 11.1% (272mg /dl). Explained what an A1c is and what it measures. Reminded patient that goal A1c is 7% or less per ADA standards to prevent both acute and long-term complications. Discussed how having his blood sugars better controlled will help facilitate healing.  Explained to patient the extreme importance of good glucose control at home. Encouraged patient to check CBGs at least bid at home and he stated he has a working glucometer and does check 2x day. Encouraged him to document his CBGs and take to his PCP appointment.   Reviewed hypo/hyperglycemia and the "living well with diabetes" book (Spanish version). Diet information/Hgb A1c info (Spanish) given to patient and reviewed.   Discussed his DM management at home. He has been diabetic for about 8 years and on insulin for about 1 year. He was seeing a Dr. Excell Seltzer on [address] but is no longer seeing him. I spoke to Darl Pikes (Sports coach) this am and she is looking into getting him an appointment at [facility]. Stressed with patient the importance of following up with an MD after discharge to help manage his diabetes better. He agreed  and stated he does want to lower his Hgb A1c.   He explained his 5-10 units range of 70/30 BID - if his CBG were high and he needed to decrease his CBG by 100 points he would take 10 units. Otherwise he would take 5 units. He states he doesn't have any trouble purchasing vial/syringe of 70/30 OTC at [facility] and would prefer to go home on that as that is what he can afford. He has been switched to Lantus for tonight but I shared with MD (Dr. Beaulah Dinning) about patient needing the most affordable option at discharge.  MD will reassess post op (as will be NPO for OR tomorrow 70/30 wouldn't be an ideal insulin during that period).    -- Will follow during hospitalization.--  Jamelle Rushing RN, MSN Diabetes Coordinator Inpatient Glycemic Control Team Team Pager: [phone] (8am-5pm)

## 2018-07-31 ENCOUNTER — Encounter (HOSPITAL_COMMUNITY)
Admission: EM | Disposition: A | Discharge status: Home Health | Payer: Self-pay | Source: Home / Self Care | Attending: Internal Medicine

## 2018-07-31 ENCOUNTER — Inpatient Hospital Stay (HOSPITAL_COMMUNITY): Payer: Medicaid Other | Admitting: Anesthesiology

## 2018-07-31 ENCOUNTER — Encounter (HOSPITAL_COMMUNITY): Payer: Self-pay

## 2018-07-31 HISTORY — PX: AMPUTATION: SHX166

## 2018-07-31 LAB — CBC
HCT: 39.4 % (ref 39.0–52.0)
Hemoglobin: 13.4 g/dL (ref 13.0–17.0)
MCH: 30 pg (ref 26.0–34.0)
MCHC: 34 g/dL (ref 30.0–36.0)
MCV: 88.1 fL (ref 80.0–100.0)
Platelets: 274 10*3/uL (ref 150–400)
RBC: 4.47 MIL/uL (ref 4.22–5.81)
RDW: 11.8 % (ref 11.5–15.5)
WBC: 5 10*3/uL (ref 4.0–10.5)
nRBC: 0 % (ref 0.0–0.2)

## 2018-07-31 LAB — GLUCOSE, CAPILLARY
Glucose-Capillary: 217 mg/dL — ABNORMAL HIGH (ref 70–99)
Glucose-Capillary: 192 mg/dL — ABNORMAL HIGH (ref 70–99)
Glucose-Capillary: 160 mg/dL — ABNORMAL HIGH (ref 70–99)
Glucose-Capillary: 229 mg/dL — ABNORMAL HIGH (ref 70–99)
Glucose-Capillary: 214 mg/dL — ABNORMAL HIGH (ref 70–99)
Glucose-Capillary: 210 mg/dL — ABNORMAL HIGH (ref 70–99)

## 2018-07-31 SURGERY — AMPUTATION DIGIT
Anesthesia: General | Site: Foot | Laterality: Left

## 2018-07-31 MED ORDER — MIDAZOLAM HCL 5 MG/5ML IJ SOLN
INTRAMUSCULAR | Status: DC | PRN
  Administered 2018-07-31: 2 mg via INTRAVENOUS

## 2018-07-31 MED ORDER — METOCLOPRAMIDE HCL 5 MG/ML IJ SOLN: 5.0000 mg | Freq: Three times a day (TID) | INTRAMUSCULAR | Status: DC | PRN

## 2018-07-31 MED ORDER — HYDROMORPHONE HCL 1 MG/ML IJ SOLN: 0.5000 mg | INTRAMUSCULAR | Status: DC | PRN

## 2018-07-31 MED ORDER — PROPOFOL 10 MG/ML IV BOLUS
INTRAVENOUS | Status: DC | PRN
  Administered 2018-07-31: 150 mg via INTRAVENOUS

## 2018-07-31 MED ORDER — PHENYLEPHRINE 40 MCG/ML (10ML) SYRINGE FOR IV PUSH (FOR BLOOD PRESSURE SUPPORT)
PREFILLED_SYRINGE | INTRAVENOUS | Status: DC | PRN
  Administered 2018-07-31 (×2): 60 ug via INTRAVENOUS

## 2018-07-31 MED ORDER — PROPOFOL 10 MG/ML IV BOLUS
INTRAVENOUS | Status: AC
  Filled 2018-07-31: qty 20

## 2018-07-31 MED ORDER — SUCCINYLCHOLINE CHLORIDE 20 MG/ML IJ SOLN
INTRAMUSCULAR | Status: DC | PRN
  Administered 2018-07-31: 120 mg via INTRAVENOUS

## 2018-07-31 MED ORDER — METHOCARBAMOL 1000 MG/10ML IJ SOLN
500.0000 mg | Freq: Four times a day (QID) | INTRAVENOUS | Status: DC | PRN
  Filled 2018-07-31: qty 5

## 2018-07-31 MED ORDER — ONDANSETRON HCL 4 MG/2ML IJ SOLN
INTRAMUSCULAR | Status: AC
  Filled 2018-07-31: qty 4

## 2018-07-31 MED ORDER — OXYCODONE HCL 5 MG PO TABS
5.0000 mg | ORAL_TABLET | ORAL | Status: DC | PRN
  Administered 2018-07-31: 16:00:00 5 mg via ORAL
  Administered 2018-07-31: 23:00:00 10 mg via ORAL
  Filled 2018-07-31: qty 1
  Filled 2018-07-31: qty 2

## 2018-07-31 MED ORDER — ACETAMINOPHEN 325 MG PO TABS: 325.0000 mg | ORAL_TABLET | Freq: Four times a day (QID) | ORAL | Status: DC | PRN

## 2018-07-31 MED ORDER — MUPIROCIN 2 % EX OINT
1.0000 "application " | TOPICAL_OINTMENT | Freq: Two times a day (BID) | CUTANEOUS | Status: DC
  Administered 2018-07-31 – 2018-08-01 (×3): 1 via NASAL
  Filled 2018-07-31: qty 22

## 2018-07-31 MED ORDER — LIDOCAINE 2% (20 MG/ML) 5 ML SYRINGE
INTRAMUSCULAR | Status: AC
  Filled 2018-07-31: qty 15

## 2018-07-31 MED ORDER — MIDAZOLAM HCL 2 MG/2ML IJ SOLN
INTRAMUSCULAR | Status: AC
  Filled 2018-07-31: qty 2

## 2018-07-31 MED ORDER — METOCLOPRAMIDE HCL 5 MG PO TABS: 5.0000 mg | ORAL_TABLET | Freq: Three times a day (TID) | ORAL | Status: DC | PRN

## 2018-07-31 MED ORDER — METHOCARBAMOL 500 MG PO TABS
500.0000 mg | ORAL_TABLET | Freq: Four times a day (QID) | ORAL | Status: DC | PRN
  Administered 2018-07-31: 23:00:00 500 mg via ORAL
  Filled 2018-07-31: qty 1

## 2018-07-31 MED ORDER — SODIUM CHLORIDE 0.9 % IV SOLN
INTRAVENOUS | Status: DC
  Administered 2018-07-31: 14:00:00 via INTRAVENOUS

## 2018-07-31 MED ORDER — INSULIN ASPART PROT & ASPART (70-30 MIX) 100 UNIT/ML ~~LOC~~ SUSP
7.0000 [IU] | Freq: Two times a day (BID) | SUBCUTANEOUS | Status: DC
  Administered 2018-07-31: 19:00:00 7 [IU] via SUBCUTANEOUS
  Filled 2018-07-31: qty 10

## 2018-07-31 MED ORDER — FENTANYL CITRATE (PF) 250 MCG/5ML IJ SOLN
INTRAMUSCULAR | Status: DC | PRN
  Administered 2018-07-31 (×2): 50 ug via INTRAVENOUS

## 2018-07-31 MED ORDER — LIDOCAINE 2% (20 MG/ML) 5 ML SYRINGE
INTRAMUSCULAR | Status: AC
  Filled 2018-07-31: qty 5

## 2018-07-31 MED ORDER — FENTANYL CITRATE (PF) 250 MCG/5ML IJ SOLN
INTRAMUSCULAR | Status: AC
  Filled 2018-07-31: qty 5

## 2018-07-31 MED ORDER — CEFAZOLIN SODIUM-DEXTROSE 1-4 GM/50ML-% IV SOLN
1.0000 g | Freq: Four times a day (QID) | INTRAVENOUS | Status: AC
  Administered 2018-07-31 (×2): 1 g via INTRAVENOUS
  Filled 2018-07-31 (×3): qty 50

## 2018-07-31 MED ORDER — ONDANSETRON HCL 4 MG PO TABS: 4.0000 mg | ORAL_TABLET | Freq: Four times a day (QID) | ORAL | Status: DC | PRN

## 2018-07-31 MED ORDER — BISACODYL 10 MG RE SUPP: 10.0000 mg | Freq: Every day | RECTAL | Status: DC | PRN

## 2018-07-31 MED ORDER — DOCUSATE SODIUM 100 MG PO CAPS
100.0000 mg | ORAL_CAPSULE | Freq: Two times a day (BID) | ORAL | Status: DC
  Administered 2018-07-31 – 2018-08-01 (×2): 100 mg via ORAL
  Filled 2018-07-31 (×2): qty 1

## 2018-07-31 MED ORDER — SUCCINYLCHOLINE CHLORIDE 200 MG/10ML IV SOSY
PREFILLED_SYRINGE | INTRAVENOUS | Status: AC
  Filled 2018-07-31: qty 20

## 2018-07-31 MED ORDER — CHLORHEXIDINE GLUCONATE CLOTH 2 % EX PADS
6.0000 | MEDICATED_PAD | Freq: Every day | CUTANEOUS | Status: DC
  Administered 2018-07-31 – 2018-08-01 (×2): 6 via TOPICAL

## 2018-07-31 MED ORDER — 0.9 % SODIUM CHLORIDE (POUR BTL) OPTIME
TOPICAL | Status: DC | PRN
  Administered 2018-07-31 (×2): 1000 mL

## 2018-07-31 MED ORDER — FENTANYL CITRATE (PF) 100 MCG/2ML IJ SOLN: 25.0000 ug | INTRAMUSCULAR | Status: DC | PRN

## 2018-07-31 MED ORDER — LACTATED RINGERS IV SOLN
INTRAVENOUS | Status: DC
  Administered 2018-07-31 (×2): via INTRAVENOUS

## 2018-07-31 MED ORDER — MAGNESIUM CITRATE PO SOLN: 1.0000 | Freq: Once | ORAL | Status: DC | PRN

## 2018-07-31 MED ORDER — POLYETHYLENE GLYCOL 3350 17 G PO PACK: 17.0000 g | PACK | Freq: Every day | ORAL | Status: DC | PRN

## 2018-07-31 MED ORDER — LIDOCAINE 2% (20 MG/ML) 5 ML SYRINGE
INTRAMUSCULAR | Status: DC | PRN
  Administered 2018-07-31: 50 mg via INTRAVENOUS

## 2018-07-31 MED ORDER — OXYCODONE HCL 5 MG PO TABS: 10.0000 mg | ORAL_TABLET | ORAL | Status: DC | PRN

## 2018-07-31 MED ORDER — ONDANSETRON HCL 4 MG/2ML IJ SOLN: 4.0000 mg | Freq: Four times a day (QID) | INTRAMUSCULAR | Status: DC | PRN

## 2018-07-31 MED ORDER — DEXAMETHASONE SODIUM PHOSPHATE 10 MG/ML IJ SOLN
INTRAMUSCULAR | Status: AC
  Filled 2018-07-31: qty 2

## 2018-07-31 MED ORDER — PROMETHAZINE HCL 25 MG/ML IJ SOLN: 6.2500 mg | INTRAMUSCULAR | Status: DC | PRN

## 2018-07-31 SURGICAL SUPPLY — 30 items
BLADE SURG 21 STRL SS (BLADE) ×3 IMPLANT
BNDG COHESIVE 4X5 TAN STRL (GAUZE/BANDAGES/DRESSINGS) ×3 IMPLANT
BNDG GAUZE ELAST 4 BULKY (GAUZE/BANDAGES/DRESSINGS) ×3 IMPLANT
CHLORAPREP W/TINT 26 (MISCELLANEOUS) ×2 IMPLANT
COVER SURGICAL LIGHT HANDLE (MISCELLANEOUS) ×4 IMPLANT
DRAPE U-SHAPE 47X51 STRL (DRAPES) ×3 IMPLANT
DRSG ADAPTIC 3X8 NADH LF (GAUZE/BANDAGES/DRESSINGS) ×2 IMPLANT
ELECT REM PT RETURN 9FT ADLT (ELECTROSURGICAL) ×3
ELECTRODE REM PT RTRN 9FT ADLT (ELECTROSURGICAL) ×1 IMPLANT
GAUZE SPONGE 4X4 12PLY STRL (GAUZE/BANDAGES/DRESSINGS) ×2 IMPLANT
GLOVE BIOGEL PI IND STRL 6.5 (GLOVE) IMPLANT
GLOVE BIOGEL PI IND STRL 9 (GLOVE) ×1 IMPLANT
GLOVE BIOGEL PI INDICATOR 6.5 (GLOVE) ×2
GLOVE BIOGEL PI INDICATOR 9 (GLOVE) ×2
GLOVE SKINSENSE NS SZ7.5 (GLOVE) ×2
GLOVE SKINSENSE STRL SZ7.5 (GLOVE) IMPLANT
GLOVE SURG ORTHO 9.0 STRL STRW (GLOVE) ×3 IMPLANT
GLOVE SURG SS PI 6.0 STRL IVOR (GLOVE) ×2 IMPLANT
GOWN STRL REUS W/ TWL LRG LVL3 (GOWN DISPOSABLE) IMPLANT
GOWN STRL REUS W/ TWL XL LVL3 (GOWN DISPOSABLE) ×2 IMPLANT
GOWN STRL REUS W/TWL LRG LVL3 (GOWN DISPOSABLE) ×4
GOWN STRL REUS W/TWL XL LVL3 (GOWN DISPOSABLE) ×2
KIT BASIN OR (CUSTOM PROCEDURE TRAY) ×3 IMPLANT
KIT TURNOVER KIT B (KITS) ×3 IMPLANT
MANIFOLD NEPTUNE II (INSTRUMENTS) ×3 IMPLANT
NS IRRIG 1000ML POUR BTL (IV SOLUTION) ×3 IMPLANT
PACK ORTHO EXTREMITY (CUSTOM PROCEDURE TRAY) ×3 IMPLANT
PAD ARMBOARD 7.5X6 YLW CONV (MISCELLANEOUS) ×4 IMPLANT
SUT ETHILON 2 0 PSLX (SUTURE) ×3 IMPLANT
TOWEL GREEN STERILE (TOWEL DISPOSABLE) ×2 IMPLANT

## 2018-07-31 NOTE — Progress Notes (Signed)
Inpatient Diabetes Program Recommendations  AACE/ADA: New Consensus Statement on Inpatient Glycemic Control  Target Ranges:  Prepandial:   less than 140 mg/dL      Peak postprandial:   less than 180 mg/dL (1-2 hours)      Critically ill patients:  140 - 180 mg/dL   Results for Jon House, Jon House (MRN [id number]) as of 07/31/2018 10:59  Ref. Range 07/30/2018 09:57 07/30/2018 12:51 07/30/2018 16:58 07/30/2018 20:08 07/30/2018 22:03 07/31/2018 08:44  Glucose-Capillary Latest Ref Range: 70 - 99 mg/dL 242 (H) 353 (H) 614 (H) 231 (H) 202 (H) 217 (H)   Review of Glycemic Control   Outpatient Diabetes medications: 70/30 20 units QAM, 70/30 43 units QPM Current orders for Inpatient glycemic control: Novolog 0-15 units TID with meals, Novolog 0-5 units QHS  Inpatient Diabetes Program Recommendations:  Insulin - Basal: Noted in progress note by Dr. Gwyneth Revels that Lantus was discontinued and patient will be ordered 70/30 7 units BID after surgery today.  Thanks, Orlando Penner, RN, MSN, CDE Diabetes Coordinator Inpatient Diabetes Program [phone] (Team Pager from 8am to 5pm)

## 2018-07-31 NOTE — Anesthesia Procedure Notes (Signed)
Procedure Name: Intubation Date/Time: 07/31/2018 10:47 AM Performed by: Marena Chancy, CRNA Pre-anesthesia Checklist: Patient identified, Emergency Drugs available, Suction available and Patient being monitored Patient Re-evaluated:Patient Re-evaluated prior to induction Oxygen Delivery Method: Circle System Utilized Preoxygenation: Pre-oxygenation with 100% oxygen Induction Type: IV induction Ventilation: Mask ventilation without difficulty Laryngoscope Size: Miller and 2 Grade View: Grade I Tube type: Oral Tube size: 7.5 mm Number of attempts: 1 Airway Equipment and Method: Stylet and Oral airway Placement Confirmation: ETT inserted through vocal cords under direct vision,  positive ETCO2 and breath sounds checked- equal and bilateral Tube secured with: Tape Dental Injury: Teeth and Oropharynx as per pre-operative assessment

## 2018-07-31 NOTE — Anesthesia Postprocedure Evaluation (Signed)
Anesthesia Post Note  Patient: Jon House  Procedure(s) Performed: LEFT GREAT TOE AMPUTATION (Left Foot)     Patient location during evaluation: PACU Anesthesia Type: General Level of consciousness: awake and alert Pain management: pain level controlled Vital Signs Assessment: post-procedure vital signs reviewed and stable Respiratory status: spontaneous breathing, nonlabored ventilation, respiratory function stable and patient connected to nasal cannula oxygen Cardiovascular status: blood pressure returned to baseline and stable Postop Assessment: no apparent nausea or vomiting Anesthetic complications: no    Last Vitals:  Vitals:   07/31/18 1153 07/31/18 1509  BP: 136/81 94/60  Pulse: 79 81  Resp: 16 18  Temp: (!) 36.4 C 36.7 C  SpO2: 100% 99%    Last Pain:  Vitals:   07/31/18 1509  TempSrc: Oral  PainSc:                  [name]

## 2018-07-31 NOTE — Progress Notes (Signed)
Orthopedic Tech Progress Note Patient Details:  Jon House April 11, 1971 [id number]  Ortho Devices Type of Ortho Device: Postop shoe/boot Ortho Device/Splint Interventions: Ordered, Application   Post Interventions Patient Tolerated: Well Instructions Provided: Adjustment of device, Care of device   [name] 07/31/2018, 12:48 PM

## 2018-07-31 NOTE — Op Note (Signed)
07/31/2018  11:10 AM  PATIENT:  Jon House    PRE-OPERATIVE DIAGNOSIS:  Osteomyelitis Left Great Toe  POST-OPERATIVE DIAGNOSIS:  Same  PROCEDURE:  LEFT GREAT TOE AMPUTATION  SURGEON:  [name], MD  PHYSICIAN ASSISTANT:None ANESTHESIA:   General  PREOPERATIVE INDICATIONS:  [name] is a  48 y.o. male with a diagnosis of Osteomyelitis Left Great Toe who failed conservative measures and elected for surgical management.    The risks benefits and alternatives were discussed with the patient preoperatively including but not limited to the risks of infection, bleeding, nerve injury, cardiopulmonary complications, the need for revision surgery, among others, and the patient was willing to proceed.  OPERATIVE IMPLANTS: None  @ENCIMAGES @  OPERATIVE FINDINGS: Good petechial bleeding at the amputation site no signs of abscess  OPERATIVE PROCEDURE: Patient was brought the operating room and underwent a general anesthetic.  After adequate levels anesthesia were obtained patient's left lower extremity was prepped using ChloraPrep draped into a sterile field a timeout was called.  A fishmouth incision was made just distal to the MTP joint.  The great toe was amputated through the MTP joint.  There was good petechial bleeding electrocautery was used for hemostasis.  The wound was irrigated with normal saline incision was closed using 2-0 nylon a sterile dressing was applied patient was extubated taken the PACU in stable condition.   DISCHARGE PLANNING:  Antibiotic duration: 24 hours postoperatively  Weightbearing: Touchdown weightbearing on the left  Pain medication: Opioid pathway ordered  Dressing care/ Wound VAC: Keep dressing clean dry and intact  Ambulatory devices: Walker or crutches  Discharge to: Home when safe with therapy  Follow-up: In the office 1 week post operative.

## 2018-07-31 NOTE — Interval H&P Note (Signed)
History and Physical Interval Note:  07/31/2018 6:44 AM  Jon House  has presented today for surgery, with the diagnosis of Osteomyelitis Left Great Toe.  The various methods of treatment have been discussed with the patient and family. After consideration of risks, benefits and other options for treatment, the patient has consented to  Procedure(s): LEFT GREAT TOE AMPUTATION (Left) as a surgical intervention.  The patient's history has been reviewed, patient examined, no change in status, stable for surgery.  I have reviewed the patient's chart and labs.  Questions were answered to the patient's satisfaction.     [name]

## 2018-07-31 NOTE — Evaluation (Signed)
Physical Therapy Evaluation Patient Details Name: Jon House MRN: [id number] DOB: 19-Jul-1970 Today's Date: 07/31/2018   History of Present Illness  Pt is a 48 y/o male s/p L great toe amputation. PMH includes DM.   Clinical Impression  Pt is s/p surgery above with deficits below. Pt requiring min guard A with RW to get to chair this session. Initially requiring cues for weightbearing status, however, towards end of session was able to maintain without cues. Feel pt will progress well and be able to d/c home with HHPT. Will continue to follow acutely to maximize functional mobility independence and safety.     Follow Up Recommendations Home health PT;Supervision for mobility/OOB    Equipment Recommendations  Rolling walker with 5" wheels    Recommendations for Other Services OT consult     Precautions / Restrictions Precautions Precautions: Fall Restrictions Weight Bearing Restrictions: Yes LLE Weight Bearing: Touchdown weight bearing      Mobility  Bed Mobility Overal bed mobility: Modified Independent                Transfers Overall transfer level: Needs assistance Equipment used: Rolling walker (2 wheeled) Transfers: Sit to/from Stand Sit to Stand: Min guard         General transfer comment: Min guard for safety. Cues to maintain weightbearing status.   Ambulation/Gait Ambulation/Gait assistance: Min guard Gait Distance (Feet): 1 Feet Assistive device: Rolling walker (2 wheeled) Gait Pattern/deviations: Step-to pattern Gait velocity: Decreased   General Gait Details: Hop to gait pattern to chair. Cues for sequencing using RW. Pt able to maintain TDWB on LLE throughout.   Stairs            Wheelchair Mobility    Modified Rankin (Stroke Patients Only)       Balance Overall balance assessment: Needs assistance Sitting-balance support: No upper extremity supported;Feet supported Sitting balance-Leahy Scale: Good     Standing  balance support: Bilateral upper extremity supported;During functional activity Standing balance-Leahy Scale: Poor Standing balance comment: Reliant on BUE support                              Pertinent Vitals/Pain Pain Assessment: Faces Faces Pain Scale: Hurts a little bit Pain Location: L foot  Pain Descriptors / Indicators: Aching;Operative site guarding Pain Intervention(s): Limited activity within patient's tolerance;Monitored during session;Repositioned    Home Living Family/patient expects to be discharged to:: Private residence Living Arrangements: Spouse/significant other;Children Available Help at Discharge: Family;Available PRN/intermittently Type of Home: House Home Access: Stairs to enter Entrance Stairs-Rails: None Entrance Stairs-Number of Steps: 2 Home Layout: One level Home Equipment: None      Prior Function Level of Independence: Independent               Hand Dominance        Extremity/Trunk Assessment   Upper Extremity Assessment Upper Extremity Assessment: Overall WFL for tasks assessed    Lower Extremity Assessment Lower Extremity Assessment: LLE deficits/detail LLE Deficits / Details: Deficits consistent with post op pain and weakness.     Cervical / Trunk Assessment Cervical / Trunk Assessment: Normal  Communication   Communication: Prefers language other than [place];Interpreter utilized  Cognition Arousal/Alertness: Awake/alert Behavior During Therapy: WFL for tasks assessed/performed Overall Cognitive Status: Within Functional Limits for tasks assessed  General Comments      Exercises     Assessment/Plan    PT Assessment Patient needs continued PT services  PT Problem List Decreased strength;Decreased balance;Decreased mobility;Decreased knowledge of use of DME;Decreased knowledge of precautions;Pain       PT Treatment Interventions DME instruction;Gait  training;Stair training;Functional mobility training;Therapeutic activities;Therapeutic exercise;Balance training;Patient/family education    PT Goals (Current goals can be found in the Care Plan section)  Acute Rehab PT Goals Patient Stated Goal: to go home PT Goal Formulation: With patient Time For Goal Achievement: 08/14/18 Potential to Achieve Goals: Good    Frequency Min 3X/week   Barriers to discharge        Co-evaluation               AM-PAC PT "6 Clicks" Mobility  Outcome Measure Help needed turning from your back to your side while in a flat bed without using bedrails?: None Help needed moving from lying on your back to sitting on the side of a flat bed without using bedrails?: None Help needed moving to and from a bed to a chair (including a wheelchair)?: A Little Help needed standing up from a chair using your arms (e.g., wheelchair or bedside chair)?: A Little Help needed to walk in hospital room?: A Little Help needed climbing 3-5 steps with a railing? : A Lot 6 Click Score: 19    End of Session Equipment Utilized During Treatment: Gait belt Activity Tolerance: Patient tolerated treatment well Patient left: in chair;with call bell/phone within reach Nurse Communication: Mobility status PT Visit Diagnosis: Other abnormalities of gait and mobility (R26.89);Pain Pain - Right/Left: Left Pain - part of body: Ankle and joints of foot    Time: 1345-1413 PT Time Calculation (min) (ACUTE ONLY): 28 min   Charges:   PT Evaluation $PT Eval Low Complexity: 1 Low PT Treatments $Therapeutic Activity: 8-22 mins        Gladys Damme, PT, DPT  Acute Rehabilitation Services  Pager: [phone] Office: [phone]   Lehman Prom 07/31/2018, 2:22 PM

## 2018-07-31 NOTE — Progress Notes (Signed)
Brief Nutrition Consult Note  RD working remotely.  RD consulted for nutrition education regarding diabetes.   Lab Results  Component Value Date   HGBA1C 11.1 (H) 07/29/2018   PTA DM medications are 70/30 BID (either 5 or 10 units depending on CBG).   Labs reviewed: CBGS: 160-202 (inpatient orders for glycemic control are 0-15 units insulin aspart TID with meals, 0-5 units insulin aspart q HS, and 7 units insulin aspart-protamin-aspart BID with meals).   Due to RD working off-site, unable to speak with pt via phone due to need for interpreter (pt is spanish-speaking). This RD will be on site tomorrow (08/01/18); RD will plan to visit pt then to provide education with interpreter assistance. "Carbohydrate Counting for People with Diabetes" handout from [facility] Nutrition Care Manual also added to discharge instructions for additional reinforcement.   Jon House, RD, LDN, CDCES Registered Dietitian II Certified Diabetes Care and Education Specialist Pager: [phone] After hours Pager: [phone]

## 2018-07-31 NOTE — Anesthesia Preprocedure Evaluation (Signed)
Anesthesia Evaluation  Patient identified by MRN, date of birth, ID band Patient awake    Reviewed: Allergy & Precautions, NPO status , Patient's Chart, lab work & pertinent test results  Airway Mallampati: II  TM Distance: >3 FB Neck ROM: Full    Dental  (+) Dental Advisory Given   Pulmonary neg pulmonary ROS,    breath sounds clear to auscultation       Cardiovascular negative cardio ROS   Rhythm:Regular Rate:Normal     Neuro/Psych  Neuromuscular disease    GI/Hepatic negative GI ROS, Neg liver ROS,   Endo/Other  diabetes, Insulin Dependent  Renal/GU negative Renal ROS     Musculoskeletal   Abdominal   Peds  Hematology negative hematology ROS (+)   Anesthesia Other Findings   Reproductive/Obstetrics                             Anesthesia Physical Anesthesia Plan  ASA: III  Anesthesia Plan: General   Post-op Pain Management:    Induction: Intravenous and Rapid sequence  PONV Risk Score and Plan: 2 and Dexamethasone and Ondansetron  Airway Management Planned: Oral ETT  Additional Equipment:   Intra-op Plan:   Post-operative Plan: Extubation in OR  Informed Consent: I have reviewed the patients History and Physical, chart, labs and discussed the procedure including the risks, benefits and alternatives for the proposed anesthesia with the patient or authorized representative who has indicated his/her understanding and acceptance.     Dental advisory given  Plan Discussed with: CRNA  Anesthesia Plan Comments:         Anesthesia Quick Evaluation

## 2018-07-31 NOTE — Transfer of Care (Signed)
Immediate Anesthesia Transfer of Care Note  Patient: Jon House  Procedure(s) Performed: LEFT GREAT TOE AMPUTATION (Left Foot)  Patient Location: PACU  Anesthesia Type:General  Level of Consciousness: awake, alert  and oriented  Airway & Oxygen Therapy: Patient Spontanous Breathing and Patient connected to face mask oxygen  Post-op Assessment: Report given to RN, Post -op Vital signs reviewed and stable and Patient moving all extremities X 4  Post vital signs: Reviewed and stable  Last Vitals:  Vitals Value Taken Time  BP 119/74 07/31/2018 11:13 AM  Temp 36.7 C 07/31/2018 11:10 AM  Pulse 73 07/31/2018 11:16 AM  Resp 8 07/31/2018 11:16 AM  SpO2 98 % 07/31/2018 11:16 AM  Vitals shown include unvalidated device data.  Last Pain:  Vitals:   07/31/18 1110  TempSrc:   PainSc: 0-No pain         Complications: No apparent anesthesia complications

## 2018-07-31 NOTE — Discharge Instructions (Addendum)
Jon House,   It has been a pleasure working with you and we are glad you're feeling better. You were hospitalized for a right toe infection. This was amputated to prevent further spread of the infection. The physical therapist recommended home health PT and they should contact you to set this up.   You can start taking the OxyIR 5 mg tablets every 6 hours as needed for pain control Please stay off that foot until you follow up with the orthopedics Please follow up with orthopedics in 1 week to be evaluated  In regards to your diabetes, your A1c was very elevated.  Please start taking metformin 1000 mg twice daily Increase your insulin 70/30 to 10 units twice a day Please continue to check your blood sugars 3-4 times a day, keep a record of this and have it with you when you follow up with the clinic Our clinic will contact you next week to see how you are doing  The financial counselor should contact you to discuss your finances  If your symptoms worsen or you develop new symptoms, please seek medical help whether it is your primary care provider or emergency department.  If you have any questions about this hospitalization please call [phone]. Jon House,  Ha sido un placer trabajar con usted y nos alegra que se sienta mejor. Usted fue hospitalizado por una infeccin en el dedo derecho. Esto fue amputado para evitar una mayor propagacin de la infeccin. El fisioterapeuta recomend PT de salud en el hogar y deben comunicarse con usted para configurarlo.  Puede comenzar a tomar los comprimidos de OxyIR 5 mg cada 6 horas segn sea necesario para Human resources officer. Por favor, mantngase alejado de ese pie hasta que contine con la ortopedia. Haga un seguimiento con ortopedia en 1 semana para ser evaluado  En lo que respecta a su diabetes, su A1c fue muy elevada. Comience a tomar metformina 1000 mg dos veces al [employer]. Aumente su insulina 70/30 a 10 unidades dos  veces al da Contine controlando su nivel de azcar en la [place] de 3 a 4 veces al da, Dietitian un registro de esto y [employer] con usted cuando realice el seguimiento en la clnica. Nuestra clnica se comunicar con usted la prxima semana para ver cmo est.  El asesor financiero debe comunicarse con usted para Chiropractor sus finanzas.  Si sus sntomas empeoran o si desarrolla nuevos sntomas, busque ayuda mdica, ya sea su proveedor de atencin primaria o el departamento de emergencias.  Si tiene [employer] hospitalizacin, llame al [phone].   Contar carbohidratos y la diabetes  Por qu es importante el conteo de carbohidratos?   Contar las porciones de carbohidratos ayuda a Sales executive nivel de glucosa (azcar) en su sangre para que se sienta mejor.   El equilibrio [employer] carbohidratos que come y Dietitian determina el nivel de glucosa que tendr en la sangre despus de comer.   Contar carbohidratos tambin le ayudar a planificar sus comidas.   Qu alimentos contienen carbohidratos?  Entre los alimentos con carbohidratos se incluyen:   Panes, galletas saladas y cereales   Pastas, arroz y Holiday representative (verduras) con almidn, como papas, elote (maz o Information systems manager) y Agricultural consultant (guisantes o arvejas)   Armed forces operational officer (habichuelas) y legumbres   [place], [place] de soya y Dentist   Frutas y jugos de fruta   Dulces como pasteles, Gaffer, helados, mermeladas y Nature conservation officer   Porciones de carbohidratos  Al planificar  comidas para la diabetes, recuerde que un alimento con 1 porcin de carbohidratos contiene aproximadamente 15 gramos de carbohidratos:   Revise el tamao de las porciones con tazas y cucharas de medir o con una pesa de alimentos.   Lea los Datos de Nutricin en las etiquetas de los alimentos para saber cuntos gramos de carbohidratos contienen los alimentos que come.   Los [employer] de este folleto muestran porciones que contienen cerca de  15 gramos de carbohidratos.   Consejos para planificar sus comidas   Un Plan de Alimentacin indica cuntas porciones de carbohidratos consumir en sus comidas y refrigerios (snacks). Para muchos adultos es adecuado comer 3 a 5 porciones de carbohidratos en cada comida y de 1 a 2 porciones de carbohidratos, en cada refrigerio.   En un Plan de Alimentacin diaria saludable, la mayora de los carbohidratos provienen de:  o Al menos 6 porciones de frutas y vegetales sin almidn  o Al menos 6 porciones de Forensic scientist, frijoles y Sports administrator con almidn, con al menos 3 de estas porciones de granos integrales (enteros)  o Al menos 2 porciones de Teaching laboratory technician o productos lcteos   Revise regularmente su nivel de glucosa en la sangre. Esto puede indicarle si necesita ajustar las horas a las que consume carbohidratos.   Comer alimentos que contienen [place], como granos [place], y comer muy pocos alimentos salados es bueno para su salud.   Coma 4 a 6 onzas de carne u otros alimentos con protenas (como hamburguesas de soya) cada da. Elija fuentes de protena bajas en grasa, como carne de res y de cerdo bajas en grasa, pollo, pescado, queso bajo en grasa o alimentos vegetarianos como la soya.   Coma algunas grasas saludables, como aceite de [place], de canola y nueces.   Coma muy pocas grasas saturadas. Estas grasas no son saludables y se Merchandiser, retail, la crema y las carnes con mucha grasa, como el tocino (tocineta) y las salchichas o Advertising copywriter.   Coma muy pocas o nada de grasas trans. Estas grasas no son saludables y se encuentran en todos los alimentos que contienen aceites parcialmente hidrogenados en su lista de ingredientes.   Consejos para leer etiquetas  En los Datos de Nutricin de las etiquetas aparece una lista con el total de gramos de carbohidratos en una porcin estndar. La porcin estndar puede ser mayor o menor que 1 porcin de carbohidratos. Para saber cuntas porciones de  carbohidratos hay en un alimento:   Primero mire el tamao de la porcin estndar de la [place].   Luego verifique el total de gramos de carbohidratos. Esta es la cantidad de carbohidratos en 1 porcin estndar. Divida el total de gramos de carbohidratos por 15. Este nmero equivale al nmero de porciones de carbohidratos en 1 porcin estndar. Recuerde: 1 porcin de carbohidratos equivale a 15 gramos de carbohidratos.   Nota: Puede ignorar los gramos de [employer] Datos de Nutricin, ya que estn incluidos en el total de gramos de carbohidratos.   Listas de alimentos para el conteo de carbohidratos  1 porcin = cerca de 15 gramos de carbohidratos  Almidones   1 rebanada de pan (1 onza)   1 tortilla (6 pulgadas)    rosca de pan (bagel) grande (1 onza)   2 tortillas para taco (5 pulgadas)    pan para hamburguesa o para salchicha (hot dog) (3/4 onza)    taza de cereal listo para comer sin endulzar    taza de  cereal cocido   1 taza de sopa a base de caldo   4-6 galletitas saladas   ? taza de pasta o arroz (cocidos)    taza de frijoles, chcharos, granos de elote, camotes (batatas, boniatos), calabaza (zapallo), pur de papas o papas hervidas (cocidos)    papa grande asada (3 onzas)    onza de pretzels, papitas o totopos (tortilla chips)   3 tazas de palomitas de maz (popcorn) (ya preparadas)   Frutas   1 fruta fresca pequea ( a 1 taza)    taza de fruta enlatada o congelada   17 uvas pequeas (3 onzas)   1 taza de meln, bayas (moras)    vaso de jugo de fruta   2 cucharadas de frutas secas (arndanos azules/blueberries, cerezas, arndanos rojos/cranberries, frutas surtidas, uvas pasas/pasitas)  [place]   1 taza de [employer] o reducida en grasa   1 taza de leche de soya   ? taza de yogur descremado endulzado con un edulcorante sin azcar (6 onzas)   Dulces y postres   pastel cuadrado de 2 pulgadas (sin betn/cobertura)   2 galletitas  dulces (? onzas)    taza de helado o yogur congelado    taza de sorbete (sherbet) o nieve (sorbet)   1 cucharada de jarabe (sirope), mermelada, jalea, azcar o miel   2 cucharadas de jarabe bajo en caloras   Otros alimentos   Cuente 1 taza de vegetales crudos o  taza de vegetales sin almidn, cocidas, como porciones de alimentos con cero (0) carbohidratos o sin restriccin. Si come 3 o ms porciones en una comida, cuntelas como 1 porcin de carbohidratos.   Los alimentos que contienen menos de 20 caloras en cada porcin tambin pueden contarse como porciones con cero carbohidratos o alimentos sin restriccin.   Cuente 1 taza de guiso (estofado) u otros alimentos combinados como 2 porciones de carbohidratos.    Contar carbohidratos y la diabetes: Ejemplo de men para 1 da [place]  1 pltano/banana pequeo (1 carbohidrato)   taza de hojuelas de maz (cornflakes) (1 carbohidrato)  1 taza de leche descremada o baja en grasa (1 carbohidrato)  1 rebanada de pan de trigo integral (1 carbohidrato)  1 cucharadita de margarina   [place]  2 onzas de rebanadas de [place]  2 rebanadas de pan de trigo integral (2 carbohidratos)  2 hojas de Company secretary  4 palitos de apio  4 palitos de zanahoria  1 Environmental health practitioner (1 carbohidrato)  1 taza de leche descremada o baja en grasa (1 carbohidrato)   Refrigerio  2 cucharadas de uvas pasas/pasitas (1 carbohidrato)   onzas de mini pretzels sin sal (1 carbohidrato)   Cena  3 onzas de carne asada de res, magra   papa grande asada (2 carbohidratos)  1 cucharada de crema agria reducida en grasa   taza de ejotes/habichuelas verdes/chauchas  1 taza de ensalada de vegetales  1 cucharada de aderezo para ensaladas reducido en caloras  1 panecillo de trigo integral (1 carbohidrato)  1 cucharadita de margarina  1 taza de bolitas de meln (1 carbohidrato)   Refrigerio  6 onzas de yogur de frutas bajo en grasa, sin azcar (1 carbohidrato)  2  cucharadas de nueces sin sal    Copyright Academy of Nutrition and Dietetics. This handout may be duplicated for client education. Carbohydrate Counting for Diabetes (Spanish)

## 2018-07-31 NOTE — Progress Notes (Addendum)
   Subjective: Patient was doing well today, no acute events overnight. Interpreter was used. He denied any fevers, chills, leg pain, chest pain, or shortness of breath. He did report that he was having some right arm pain where the IV site was, we discussed that only the plastic is there now but that it may be causing some irritation. We discussed that if the area continues to bother him we may be able to move the IV site. We discussed that he will be going to surgery today and that we will be restarting his insulin after the procedure. He was asking about meeting with the social worker today.  Objective:  Vital signs in last 24 hours: Vitals:   07/30/18 0420 07/30/18 1426 07/30/18 2006 07/31/18 0430  BP: 116/72 95/61 106/70 100/68  Pulse: 79 77 75 83  Resp: 16 17 17 16   Temp: 98.4 F (36.9 C) 98 F (36.7 C) 98.1 F (36.7 C) 98.1 F (36.7 C)  TempSrc: Oral Oral Oral Oral  SpO2: 99% 99% 98% 99%  Weight:      Height:        General: Well appearing male, NAD, resting comfortably Cardiac: RRR, no m/r/g Pulmonary: CTABL, no wheezing or rhonchi Abdomen: Soft, non-tender, non-distended Extremity: Left 1st digit with ulceration on distal aspect, mild warmth, no erythema or edema  Assessment/Plan:  Active Problems:   Osteomyelitis of great toe of left foot (HCC)   Diabetic polyneuropathy associated with type 2 diabetes mellitus (HCC)  Osteomyelitis of the left 1st digit: He has remained afebrile with no leukocytosis, vitals have been stable. ABIs were negative for vascular disease, no evidence of any clots. Orthopedics planned for amputation today and the risks and benefits were discussed with the patient. He will likely have poor wound healing due to his uncontrolled diabetes, A1c was 11. 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   -Orthopedics following, appreciate recommendations -Planned for amputation today  -Tylenol PRN -Daily CBC -Carb mod diet after surgery -Will need PT consult -Care manager is following, consult placed to financial counselor to follow up  Diabetes type 2: A1c was 11.5, appears to have uncontrolled diabetes. He does take insulin 70/30 twice a day, either 5-10 units at a time. Will restart his insulin 70/30 after the surgery.  -Discontinue Lantus 10  -Start insulin 70/30 7 units BID after surgery -Continue SSI-mod -Frequent CBGs  FEN: No fluids, replete lytes prn, NPO, then carb mod diet VTE ppx: Lovenox  Code Status: FULL    Dispo: Anticipated discharge is pending clinical improvement  Claudean Severance, MD 07/31/2018, 7:04 AM Pager: [phone]

## 2018-07-31 NOTE — Progress Notes (Signed)
0900 Pt to short stay, no interpreter present, stratus not working at this time. Pt can understand a little [place]. NPO maint.

## 2018-08-01 ENCOUNTER — Encounter (HOSPITAL_COMMUNITY): Payer: Self-pay | Admitting: Orthopedic Surgery

## 2018-08-01 DIAGNOSIS — Z89412 Acquired absence of left great toe: Secondary | ICD-10-CM

## 2018-08-01 LAB — GLUCOSE, CAPILLARY
Glucose-Capillary: 164 mg/dL — ABNORMAL HIGH (ref 70–99)
Glucose-Capillary: 181 mg/dL — ABNORMAL HIGH (ref 70–99)

## 2018-08-01 MED ORDER — PHENOL 1.4 % MT LIQD
1.0000 | OROMUCOSAL | Status: DC | PRN
  Filled 2018-08-01: qty 177

## 2018-08-01 MED ORDER — METFORMIN HCL 1000 MG PO TABS: 1000.0000 mg | ORAL_TABLET | Freq: Two times a day (BID) | ORAL | 0 refills | Status: DC

## 2018-08-01 MED ORDER — OXYCODONE HCL 5 MG PO TABS: 5.0000 mg | ORAL_TABLET | Freq: Four times a day (QID) | ORAL | 0 refills | Status: DC | PRN

## 2018-08-01 MED ORDER — INSULIN ASPART PROT & ASPART (70-30 MIX) 100 UNIT/ML ~~LOC~~ SUSP
10.0000 [IU] | Freq: Two times a day (BID) | SUBCUTANEOUS | Status: DC
  Administered 2018-08-01: 09:00:00 10 [IU] via SUBCUTANEOUS
  Filled 2018-08-01: qty 10

## 2018-08-01 MED ORDER — INSULIN ASPART PROT & ASPART (70-30 MIX) 100 UNIT/ML ~~LOC~~ SUSP: 10.0000 [IU] | Freq: Two times a day (BID) | SUBCUTANEOUS | 1 refills | Status: DC

## 2018-08-01 NOTE — Progress Notes (Signed)
AVS given and reviewed with pt. Medications discussed and reviewed. Ashby Dawes, interpreter, at bedside for duration of discharge education. All questions answered to satisfaction. Rolling walker at bedside. Pt to be escorted off the unit via wheelchair by staff member.

## 2018-08-01 NOTE — Plan of Care (Signed)
  RD consulted for nutrition education regarding diabetes.    Lab Results  Component Value Date   HGBA1C 11.1 (H) 07/29/2018   PTA DM medications are 70/30 BID (either 5 or 10 units depending on CBG).   Labs reviewed: 160-229 (inpatient orders for glycemic control are 0-15 units insulin aspart TID with meals, 0-5 units insulin aspart q HS, and 10 units 70/30 BID).    Case discussed with RN prior to visit, who reports pt will be discharged home today. Pt knows some English, but has been using Stratus Interpreter for more advanced concepts.   Spoke with pt at bedside, who was pleasant and in good spirits today. He reports that he has a good appetite and consumes 3 meals per day PTA (Breakfast: 5 tortillas, eggs, fruit, and coffee with whole milk and 2 scoops of sugar; Lunch: homemade chicken soup with 5 tortillas; Dinner: homemade soup with 5 tortillas OR takeout tacos with rice and beans). Pt shares he consumes approximately 3 cups of coffee per day, but main beverage is water. He does not consume soda or juice. His wife and daughter do most of the cooking.  Focus of education was on diet and diabetes self-management. Discussed plate method with pt and encouraged increased intake of fresh fruits and vegetables and continuing with drinking water. Pt also reports he plans to use artificial sweeteners (Stevia or splenda) in coffee in place of white sugar. Discussed sources of carbohydrates and ways pt could decrease intake as well as increase fiber in diet. Spoke at length with pt concerning importance of improved glycemic control through diet, self-management, and follow-up to promote healing and further DM complications.   Pt requested that this RD call pt daughter Morrie Sheldon) to reinforce concepts. RD spoke with with daughter by the phone and obtained confirmed diet history from pt. Also summarized education session and recommendations for pt. Pt daughter with no questions, but expressed appreciation for  call. Informed daughter that this RD's contact information was on handout if she had additional questions.   Per MD notes, plan for [facility]. Provided referral to outpatient diabetes education at [facility] for further reinforcement.   RD provided "Carbohydrate Counting for People with Diabetes" handout (in Spanish) from the Academy of Nutrition and Dietetics. Discussed different food groups and their effects on blood sugar, emphasizing carbohydrate-containing foods. Provided list of carbohydrates and recommended serving sizes of common foods.  Discussed importance of controlled and consistent carbohydrate intake throughout the day. Provided examples of ways to balance meals/snacks and encouraged intake of high-fiber, whole grain complex carbohydrates. Teach back method used.  Expect fair to good compliance.  Body mass index is 26.23 kg/m. Pt meets criteria for overweight based on current BMI.  Current diet order is carb modified, patient is consuming approximately 100% of meals at this time. Labs and medications reviewed. No further nutrition interventions warranted at this time. RD contact information provided. If additional nutrition issues arise, please re-consult RD.  Emrick Hensch A. Mayford Knife, RD, LDN, CDCES Registered Dietitian II Certified Diabetes Care and Education Specialist Pager: [phone] After hours Pager: [phone]

## 2018-08-01 NOTE — Progress Notes (Signed)
Patient ID: Jon House, male   DOB: 1971/01/19, 48 y.o.   MRN: [id number]  Patient is postoperative day 1 status post left great toe amputation.  There is a small amount of bleeding through the dressing.  Patient is safe from an orthopedic standpoint for discharge to home.  Patient should maintain nonweightbearing on the left lower extremity.  I will follow-up in the office in 1 week and will write him a note for when it would be safe to return to work.

## 2018-08-01 NOTE — Plan of Care (Signed)

## 2018-08-01 NOTE — TOC Initial Note (Addendum)
Transition of Care [facility]) - Initial/Assessment Note    Patient Details  Name: Jon House MRN: [id number] Date of Birth: 01-18-1971  Transition of Care [facility]) CM/SW Contact:    Durenda Guthrie, RN Phone Number: 08/01/2018, 8:49 AM  Clinical Narrative:    48 yr old male admitted with osteo of left great toe. Patient underwent left great toe amputation on 07/31/18. Case manager called referral for Home Health to Lorenza Chick, [facility] Liaison. CM scheduled appointment for patient with [facility] for Monday, August 05, 2018 at 9:50am. Appointment placed on AVS. **8:57am CM spoke with Dr. Ward Givens, she states that patient will be following with them [facility], they will arrange a Tele-visit. CM will cancel appointment at [facility]. Patient gets his meds at [facility].                Expected Discharge Plan: Home w Home Health Services Barriers to Discharge: No Barriers Identified   Patient Goals and CMS Choice     Choice offered to / list presented to : (patient uninsured, set up with [facility] vendor )  Expected Discharge Plan and Services Expected Discharge Plan: Home w Home Health Services   Discharge Planning Services: CM Consult, Follow-up appt scheduled Post Acute Care Choice: Durable Medical Equipment, Home Health Living arrangements for the past 2 months: Single Family Home                 DME Arranged: Walker rolling DME Agency: [employer] HH Arranged: PT HH Agency: [facility]  Prior Living Arrangements/Services Living arrangements for the past 2 months: Single Family Home     Do you feel safe going back to the place where you live?: Yes               Activities of Daily Living Home Assistive Devices/Equipment: CBG Meter ADL Screening (condition at time of admission) Patient's cognitive ability adequate to safely complete daily activities?: Yes Is the patient deaf or have difficulty hearing?: No Does the  patient have difficulty seeing, even when wearing glasses/contacts?: No Does the patient have difficulty concentrating, remembering, or making decisions?: No Patient able to express need for assistance with ADLs?: Yes Does the patient have difficulty dressing or bathing?: No Independently performs ADLs?: Yes (appropriate for developmental age) Does the patient have difficulty walking or climbing stairs?: No Weakness of Legs: Left Weakness of Arms/Hands: None  Permission Sought/Granted Permission sought to share information with : Case Manager                Emotional Assessment Appearance:: Appears stated age   Affect (typically observed): Accepting Orientation: : Oriented to Self, Oriented to Situation, Oriented to Place, Oriented to  Time Alcohol / Substance Use: Not Applicable    Admission diagnosis:  Osteomyelitis of great toe of left foot [facility]) [M86.9] Patient Active Problem List   Diagnosis Date Noted  . Osteomyelitis of great toe of left foot (HCC) 07/29/2018  . Diabetic polyneuropathy associated with type 2 diabetes mellitus [facility])    PCP:  Patient, No Pcp Per Pharmacy:   [facility] [address] [place], [place] - [address] [place] [address] Phone: [phone] Fax: [phone]     Social Determinants of Health (SDOH) Interventions    Readmission Risk Interventions No flowsheet data found.

## 2018-08-01 NOTE — Discharge Summary (Signed)
Name: Jon House MRN: [id number] DOB: 03/03/1971 48 y.o. PCP: Patient, No Pcp Per  Date of Admission: 07/29/2018 10:38 AM Date of Discharge: 08/01/2018 Attending Physician: Gust Rung, DO  Discharge Diagnosis: 1. Osteomyelitis of the left 1st digit s/p amputation 2. DM type 2  Discharge Medications: Allergies as of 08/01/2018   No Known Allergies     Medication List    STOP taking these medications   insulin NPH-regular Human (70-30) 100 UNIT/ML injection   OVER THE COUNTER MEDICATION     TAKE these medications   insulin aspart protamine- aspart (70-30) 100 UNIT/ML injection Commonly known as:  NOVOLOG MIX 70/30 Inject 0.1 mLs (10 Units total) into the skin 2 (two) times daily with a meal.   metFORMIN 1000 MG tablet Commonly known as:  GLUCOPHAGE Take 1 tablet (1,000 mg total) by mouth 2 (two) times daily with a meal.   oxyCODONE 5 MG immediate release tablet Commonly known as:  Oxy IR/ROXICODONE Take 1 tablet (5 mg total) by mouth every 6 (six) hours as needed for moderate pain.            Discharge Care Instructions  (From admission, onward)         Start     Ordered   07/31/18 0000  Touch down weight bearing    Question Answer Comment  Laterality left   Extremity Lower      07/31/18 1105          Disposition and follow-up:   Jon House was discharged from [facility] in Stable condition.  At the hospital follow up visit please address:  1.  Osteomyelitis s/p amputation: Please make sure that he has follow up with orthopedics. He should be non-weight bearing until then. Please make sure that he has been getting HH.   Please make sure that the financial counselor spoke with him, they said that they would contact him.   DM: His A1c was 11. He is on insulin 70/30 10 units twice a day and started on metformin. Please assess how this is working for him.   2.  Labs / imaging needed at time of follow-up: CBC,  BMP  3.  Pending labs/ test needing follow-up: None  Follow-up Appointments: Follow-up Information    [name], MD In 1 week.   Specialty:  Orthopedic Surgery Contact information: [address] [place] [address] [phone]        Care, [facility] Follow up.   Specialty:  Home Health Services Why:  A representative from [facility] will contact you to arrange start date and time for your therapy. Contact information: [address] [place] [address] [phone]        Claudean Severance, MD Follow up.   Specialty:  Internal Medicine Why:  We will contact you by phone on April 9th at 10:15, please contact us if this does not work for you.   Nos comunicaremos con usted por telfono el 9 de abril a las 10:15, contctenos si esto no funciona para usted. Contact information: [address] [place] [address] [phone]           Hospital Course by problem list: 1. Osteomyelitis of the left 1st digit: This is a 48 year old male with a histoyr of type 2 DM who presented with a 3 day history of left first toe foot pain and swelling. On exam he has swelling of  his left foot up to his ankle, with an ulceration on the distal aspect of his left 1st toe, no drainage from the area. Left foot x-ray showed osteomyelitis of the 1st digit.He had no systemic symptoms and remained afebrile with no leukocytosis so no antibiotics were started. Orthopedics was consulted and they recommended amputation. ABIs were negative. He underwent amputation on 07/31/18. No complications from the surgery. PT evaluated after the surgery and recommending HH PT and rolling walker. He was prescribed a short course of pain medications and he will follow up with orthopedics in 1 week.   2. DM: Patient reported that he took 5-10 units of 70/30 insulin twice a day. He did have a PCP however they were closed due to the coronavirus. His CBGs while he was  admitted were elevated and his A1c was 11. He reported that he had been on oral medications in the past however that they did not work so they just continued insulin. We started metformin on discharge and advised him to take 10 units 70/30 insulin BID AC.  Discharge Vitals:   BP 113/68 (BP Location: Left Arm)   Pulse 85   Temp 98.1 F (36.7 C) (Oral)   Resp 16   Ht 5' 6.93" (1.7 m)   Wt 75.8 kg   SpO2 98%   BMI 26.23 kg/m   Pertinent Labs, Studies, and Procedures:  CBC Latest Ref Rng & Units 07/31/2018 07/30/2018 07/30/2018  WBC 4.0 - 10.5 K/uL 5.0 4.1 5.5  Hemoglobin 13.0 - 17.0 g/dL 31.4 38.8 87.5  Hematocrit 39.0 - 52.0 % 39.4 41.2 38.4(L)  Platelets 150 - 400 K/uL 274 269 264   BMP Latest Ref Rng & Units 07/30/2018 07/29/2018  Glucose 70 - 99 mg/dL 797(K) 820(U)  BUN 6 - 20 mg/dL 15 9  Creatinine 0.15 - 1.24 mg/dL 6.15 3.79(K)  Sodium 327 - 145 mmol/L 137 137  Potassium 3.5 - 5.1 mmol/L 3.8 3.8  Chloride 98 - 111 mmol/L 99 101  CO2 22 - 32 mmol/L 31 27  Calcium 8.9 - 10.3 mg/dL 9.0 9.2   Left foot x-ray 3/30: IMPRESSION: Osteomyelitis of the distal phalanx first digit.  Discharge Instructions: Discharge Instructions    Amb Referral to Nutrition and Diabetic E   Complete by:  As directed    Call MD for:  difficulty breathing, headache or visual disturbances   Complete by:  As directed    Call MD for:  extreme fatigue   Complete by:  As directed    Call MD for:  hives   Complete by:  As directed    Call MD for:  persistant dizziness or light-headedness   Complete by:  As directed    Call MD for:  persistant nausea and vomiting   Complete by:  As directed    Call MD for:  redness, tenderness, or signs of infection (pain, swelling, redness, odor or green/yellow discharge around incision site)   Complete by:  As directed    Call MD for:  severe uncontrolled pain   Complete by:  As directed    Call MD for:  temperature >100.4   Complete by:  As directed    Diet - low  sodium heart healthy   Complete by:  As directed    Discharge instructions   Complete by:  As directed    [name],   It has been a pleasure working with you and we are glad you're feeling better. You were hospitalized for a  right toe infection. This was amputated to prevent further spread of the infection. The physical therapist recommended home health PT and they should contact you to set this up.   You can start taking the OxyIR 5 mg tablets every 6 hours as needed for pain control Please stay off that foot until you follow up with the orthopedics Please follow up with orthopedics in 1 week to be evaluated  In regards to your diabetes, your A1c was very elevated.  Please start taking metformin 1000 mg twice daily Increase your insulin 70/30 to 10 units twice a day Please continue to check your blood sugars 3-4 times a day, keep a record of this and have it with you when you follow up with the clinic Our clinic will contact you next week to see how you are doing  The financial counselor should contact you to discuss your finances  If your symptoms worsen or you develop new symptoms, please seek medical help whether it is your primary care provider or emergency department.  If you have any questions about this hospitalization please call [phone]. [name],  Ha sido un placer trabajar con usted y nos alegra que se sienta mejor. Usted fue hospitalizado por una infeccin en el dedo derecho. Esto fue amputado para evitar una mayor propagacin de la infeccin. El fisioterapeuta recomend PT de salud en el hogar y deben comunicarse con usted para configurarlo.  Puede comenzar a tomar los comprimidos de OxyIR 5 mg cada 6 horas segn sea necesario para Human resources officer. Por favor, mantngase alejado de ese pie hasta que contine con la ortopedia. Haga un seguimiento con ortopedia en 1 semana para ser evaluado  En lo que respecta a su diabetes, su A1c fue muy  elevada. Comience a tomar metformina 1000 mg dos veces al [employer]. Aumente su insulina 70/30 a 10 unidades dos veces al da [place] controlando su nivel de azcar en la [place] de 3 a 4 veces al da, Dietitian un registro de esto y [employer] con usted cuando realice el seguimiento en la clnica. Nuestra clnica se comunicar con usted la prxima semana para ver cmo est.  El asesor financiero debe comunicarse con usted para Chiropractor sus finanzas.  Si sus sntomas empeoran o si desarrolla nuevos sntomas, busque ayuda mdica, ya sea su proveedor de atencin primaria o el departamento de emergencias.  Si tiene [employer] hospitalizacin, llame al [phone].   Increase activity slowly   Complete by:  As directed    Touch down weight bearing   Complete by:  As directed    Laterality:  left   Extremity:  Lower      Signed: Claudean Severance, MD 08/02/2018, 9:44 AM   Pager: [phone]

## 2018-08-01 NOTE — Progress Notes (Addendum)
   Subjective: Mr. Lily Kocher states that he is feeling well overall but is having a sore throat this morning. He denies fevers, chills, cough, and congestion. He is also having mild foot pain this morning. He slept well. He was able to get up and use the bathroom by himself. He was asking to be seen by a Child psychotherapist in regards to financial issues, we discussed that we will see if we can get the financial counselor to see him.    Objective:  Vital signs in last 24 hours: Vitals:   07/31/18 1153 07/31/18 1509 07/31/18 2017 08/01/18 0400  BP: 136/81 94/60 114/70 111/63  Pulse: 79 81 80 82  Resp: 16 18 17    Temp: (!) 97.5 F (36.4 C) 98 F (36.7 C) 98 F (36.7 C) 98.2 F (36.8 C)  TempSrc: Oral Oral Oral Oral  SpO2: 100% 99% 96% 100%  Weight:      Height:       General: Lying in bed in no acute distress Cardiovascular: Normal rate, regular rhythm, no m/r/g Respiratory: CTAB, normal WOB Extremity: Left 1st digit in bandage, good pulses and warm extremities Psych: Normal behavior, mood, and affect  Assessment/Plan:  Active Problems:   Osteomyelitis of great toe of left foot (HCC)   Diabetic polyneuropathy associated with type 2 diabetes mellitus (HCC)  This is a 48 year old male with a history of diabetes type 2 presented with worsening left big toe pain and swelling.  X-ray showed signs of osteomyelitis of the left first digit. ABIs were negative for vascular disease, no evidence of any clots. Orthopedics took him for an amputation 4/1 and he tolerated the procedure well.   Osteomyelitis of the left first digit s/p amputation: Patient has remained afebrile, other vitals have been stable.  He reported that he is having some pain in his foot that is stable.  He has been able to get up to the bathroom with no issues.  Orthopedics evaluated patient at this morning, they recommended nonweightbearing on the left lower extremity and follow-up in 1 week.  Physical therapy evaluated him yesterday  and recommended home health PT with a rolling walker.  -Continue OxyIR every 4 hours, will give a short course and discharge -Continue Tylenol PRN -PT on board, rolling walker and PT home health -Follow-up with orthopedics in 1 week -Follow-up with our clinic next week -Care management consulted financial counselor to follow-up -Stable for discharge today  Diabetes type 2: -A1c on admission was 11.5.  He takes insulin 70/30 twice daily either 5 to 10 units at a time.  We restarted 70/30 7 units BID yesterday and CBGs were still elevated.  Given his uncontrolled diabetes we will increase his insulin regimen and add metformin on discharge. -Increase insulin 70/30 to 10 units twice daily -Continue sliding scale insulin moderate -Continue frequent CBGs -Add metformin on discharge -F/u with our clinic in 1 week on telehealth (confirmed his number in our chart)  Dispo: Anticipated discharge in approximately today.   Claudean Severance, MD 08/01/2018, 8:45 AM Pager: [phone]

## 2018-08-01 NOTE — Progress Notes (Deleted)
Internal Medicine Attending:   I saw and examined the patient. I reviewed the Dr Sylvester Harder note and I agree with the resident's findings and plan as documented in the resident's note.  Patient doing well post op. All questions answered. Discussed need for foot care vigilance. Agree with increasing 70/30 to 10u BID and adding metformin.  As outpatient adding statin should be considered.

## 2018-08-05 ENCOUNTER — Other Ambulatory Visit: Payer: Self-pay

## 2018-08-05 ENCOUNTER — Ambulatory Visit: Payer: Self-pay | Attending: Family Medicine | Admitting: Family Medicine

## 2018-08-05 ENCOUNTER — Encounter: Payer: Self-pay | Admitting: Family Medicine

## 2018-08-05 VITALS — BP 101/68 | HR 90 | Temp 98.4°F | Resp 18 | Ht 71.0 in | Wt 165.0 lb

## 2018-08-05 DIAGNOSIS — Z09 Encounter for follow-up examination after completed treatment for conditions other than malignant neoplasm: Secondary | ICD-10-CM

## 2018-08-05 DIAGNOSIS — S98112A Complete traumatic amputation of left great toe, initial encounter: Secondary | ICD-10-CM

## 2018-08-05 DIAGNOSIS — E1142 Type 2 diabetes mellitus with diabetic polyneuropathy: Secondary | ICD-10-CM

## 2018-08-05 DIAGNOSIS — M869 Osteomyelitis, unspecified: Secondary | ICD-10-CM

## 2018-08-05 LAB — GLUCOSE, POCT (MANUAL RESULT ENTRY): POC Glucose: 128 mg/dL — AB (ref 70–99)

## 2018-08-05 MED ORDER — TRUEPLUS LANCETS 28G MISC: 11 refills | Status: DC

## 2018-08-05 MED ORDER — GLUCOSE BLOOD VI STRP: ORAL_STRIP | 12 refills | Status: DC

## 2018-08-05 MED ORDER — OXYCODONE HCL 5 MG PO TABS: 5.0000 mg | ORAL_TABLET | Freq: Four times a day (QID) | ORAL | 0 refills | Status: DC | PRN

## 2018-08-05 MED ORDER — TRUE METRIX METER W/DEVICE KIT: PACK | 0 refills | Status: DC

## 2018-08-05 MED ORDER — METFORMIN HCL 1000 MG PO TABS: 1000.0000 mg | ORAL_TABLET | Freq: Two times a day (BID) | ORAL | 1 refills | Status: DC

## 2018-08-05 MED ORDER — INSULIN ASPART PROT & ASPART (70-30 MIX) 100 UNIT/ML ~~LOC~~ SUSP: 10.0000 [IU] | Freq: Two times a day (BID) | SUBCUTANEOUS | 5 refills | Status: DC

## 2018-08-05 MED ORDER — AMOXICILLIN-POT CLAVULANATE 500-125 MG PO TABS: 1.0000 | ORAL_TABLET | Freq: Two times a day (BID) | ORAL | 0 refills | Status: DC

## 2018-08-05 MED FILL — TRUE METRIX TEST STRIP: 30 days supply | Qty: 100 | Fill #0

## 2018-08-05 MED FILL — TRUEplus LANCETS 28G MISC: 30 days supply | Qty: 100 | Fill #0

## 2018-08-05 MED FILL — !TRUE METRIX BLOOD GLUCOSE: 30 days supply | Qty: 1 | Fill #0

## 2018-08-05 MED FILL — metFORMIN HCL 1000 MG TABS: 1000 | 30 days supply | Qty: 60 | Fill #0

## 2018-08-05 MED FILL — !NOVOLOG MIX 70-30 FLEXPEN: 30 days supply | Qty: 6 | Fill #0

## 2018-08-05 MED FILL — AMOX-CLAV 500-125 MG TABLET: 500-125 | 10 days supply | Qty: 20 | Fill #0

## 2018-08-05 NOTE — Patient Instructions (Signed)
Osteomielitis en los adultos  Osteomyelitis, Adult    Las infecciones seas (osteomielitis) se producen cuando en un hueso penetran bacterias u otros grmenes. Esto puede ocurrir si tiene una infeccin en otra parte del cuerpo que se propaga a travs de la sangre. Los grmenes que hay en la piel o que se encuentran fuera del cuerpo tambin pueden causar este tipo de infeccin, si tiene una herida o un hueso quebrado (fractura) que le desgarra la piel.  Las infecciones seas deben tratarse rpidamente para evitar daos en los huesos y para impedir que la infeccin se propague a otras zonas del cuerpo.  Cules son las causas?  La mayora de las infecciones seas son causadas por bacterias. Otros grmenes tambin pueden producirlas, por ejemplo, los virus y los hongos.  Qu incrementa el riesgo?  Es ms probable que tenga esta afeccin si:   Recientemente se someti a una ciruga, especialmente de los huesos o de las articulaciones.   Tiene una enfermedad a largo plazo (crnica), como las siguientes:  ? Diabetes.  ? VIH (virus de inmunodeficiencia humana).  ? Artritis reumatoide.  ? Anemia drepanoctica.  ? Enfermedad renal que requiere dilisis.   Tiene 60 aos o ms.   Tiene una afeccin o toma medicamentos que bloquean o debilitan el sistema de defensa del cuerpo (sistema inmunitario).   Tiene una afeccin que reduce el flujo de sangre.   Tiene una articulacin artificial.   Se someti a una reparacin sea o articular con placas o tornillos (dispositivos quirrgicos).   Consume drogas por va intravenosa.   Tiene una va central para acceso intravenoso (i.v.).   Ha tenido un traumatismo, por ejemplo, pis un clavo o un hueso roto que atraves la piel.  Cules son los signos o los sntomas?  Los sntomas varan segn el tipo y la ubicacin de la infeccin. Los sntomas frecuentes de las infecciones seas incluyen los siguientes:   Fiebre y escalofros.   Enrojecimiento y calor en la  piel.   Hinchazn.   Dolor y rigidez.   Secrecin de lquido o pus cerca de la infeccin.  Cmo se diagnostica?  Esta afeccin se puede diagnosticar en funcin de lo siguiente:   Los sntomas y antecedentes mdicos.   Un examen fsico.   Estudios, como por ejemplo:  ? La extraccin de una muestra de tejido, lquido o sangre para examinarla con un microscopio.  ? El hisopado de pus o secrecin de una herida para su anlisis, con el fin de identificar los grmenes y determinar qu tipo de medicamento los eliminar (cultivo y antibiograma).  ? Anlisis de sangre.   Estudios de diagnstico por imgenes. Estos pueden incluir lo siguiente:  ? Radiografas.  ? Resonancia magntica (RM).  ? Exploracin por tomografa computarizada (TC).  ? Gammagrafa sea.  ? Ecografa.  Cmo se trata?  El tratamiento de esta afeccin depende de la causa y el tipo de la infeccin. Generalmente, los antibiticos son el primer tratamiento para una infeccin sea. Este puede realizarse en un hospital al principio. Luego, es posible que contine recibiendo antibiticos intravenosos en su casa o que los tome por va oral durante varias semanas.  Otros tratamientos pueden incluir ciruga para extirpar:   El tejido necrosado o a punto de necrosarse de un hueso.   Una articulacin artificial infectada.   Las placas o los tornillos infectados que se usaron para reparar un hueso fracturado.  Siga estas indicaciones en su casa:  Medicamentos     Tome los   medicamentos de venta libre y los recetados solamente como se lo haya indicado el mdico.   Tome los antibiticos como se lo haya indicado el mdico. No deje de tomar el antibitico aunque comience a sentirse mejor.   Siga las indicaciones del mdico acerca de cmo recibir los antibiticos intravenosos en su casa. Tal vez deba pedirle a un enfermero que vaya a su casa para que le administre los antibiticos intravenosos.  Indicaciones generales     Pregntele al mdico si tiene alguna  restriccin en sus actividades.   Si se lo indican, aplique hielo sobre la zona afectada:  ? Ponga el hielo en una bolsa plstica.  ? Coloque una toalla entre la piel y la bolsa de hielo.  ? Coloque el hielo durante 20minutos, 2 a 3veces por da.   Lvese las manos frecuentemente con agua y jabn. Use desinfectante para manos si no dispone de agua y jabn.   No consuma ningn producto que contenga nicotina o tabaco, como cigarrillos y cigarrillos electrnicos. Estos pueden retrasar la consolidacin del hueso. Si necesita ayuda para dejar de fumar, consulte al mdico.   Concurra a todas las visitas de seguimiento como se lo haya indicado el mdico. Esto es importante.  Comunquese con un mdico si:   Tiene escalofros o fiebre.   Tiene enrojecimiento, sensacin de calor, dolor o hinchazn que regresan despus del tratamiento.  Solicite ayuda de inmediato si:   Su respiracin es rpida o tiene dificultad para respirar.   Siente dolor en el pecho.   No puede beber lquidos ni producir orina.   La zona afectada se hincha, cambia de color o se torna de color azul.   Siente adormecimiento o dolor intenso en la zona afectada.  Resumen   Las infecciones seas (osteomielitis) se producen cuando en un hueso penetran bacterias u otros grmenes.   Su probabilidad de contraer este tipo de infeccin puede aumentar si tiene una afeccin, como la diabetes, que reduce su capacidad de combatir las infecciones o aumenta sus probabilidades de contraer una infeccin.   La mayora de las infecciones seas son causadas por bacterias. Otros grmenes tambin pueden producirlas, por ejemplo, los virus y los hongos.   Generalmente, el tratamiento de esta afeccin comienza con antibiticos. La continuacin del tratamiento depende de la causa y el tipo de infeccin.  Esta informacin no tiene como fin reemplazar el consejo del mdico. Asegrese de hacerle al mdico cualquier pregunta que tenga.  Document Released: 07/14/2008  Document Revised: 06/18/2017 Document Reviewed: 06/18/2017  Elsevier Interactive Patient Education  2019 [employer]

## 2018-08-05 NOTE — Progress Notes (Signed)
New Patient Office Visit  Subjective:  Patient ID: Jon House, male    DOB: 08-16-1970  Age: 48 y.o. MRN: [id number]  Due to a language barrier, [employer] video interpretation system used at today's visit CC:  Chief Complaint  Patient presents with  . Hospitalization Follow-up    [name] presents for establishment of care/hospital follow-up s/p hospitalization from 07/29/2018-08/01/2018 for osteomyelitis of the left great toe requiring amputation and patient with Type 2 Diabetes. Hemoglobin A1c of 11.1 on 07/29/2018. Patient reports that yesterday he noticed bleeding through the bandages on his left foot at the site of his surgery. Patient has not had anyone change the dressing on his left foot since he was discharged from the hospital. Pain had gotten better, down to a 3-4 but over the past few days pain is now increasing again. Ranges between a 4-5 but sometimes has a sharp or throbbing pain that ranges from a 6-8 on a 0-10 scale with 0 being no pain and 10 being the worst pain ever. Patient has finished the pain medication prescribed at the time of his hospital discharge. He denies any constipation associated with the use of the narcotic pain medication.  He reports that his blood sugars have ranged from the 200s to as low as 128 since being on insulin status post hospital discharge.  Patient reports that he has been taking 10 units of insulin 3 times daily.  Patient denies any significant hypoglycemic episodes.  Patient has not felt weak, shaky or jittery.  Patient has had no sweating or confusion.  Patient denies any fever but has felt as if he has had chills over the past few days.  Patient reports that he did smoke in the past, about 15 cigarettes/day for about 4 years.  Patient has family history significant for mother with diabetes.  Past Medical History:  Diagnosis Date  . Diabetes mellitus without complication [facility])     Past Surgical History:  Procedure  Laterality Date  . AMPUTATION Left 07/31/2018   Procedure: LEFT GREAT TOE AMPUTATION;  Surgeon: Newt Minion, MD;  Location: [place];  Service: Orthopedics;  Laterality: Left;    Family History  Problem Relation Age of Onset  . Diabetes Mother     Social History   Tobacco Use  . Smoking status: Never Smoker  . Smokeless tobacco: Never Used  Substance Use Topics  . Alcohol use: Yes    Comment: sometimes  . Drug use: Never    Allergies  Allergen Reactions  . Bee Venom Hives    ROS Review of Systems  Constitutional: Positive for chills and fatigue. Negative for diaphoresis and fever.  HENT: Negative for sore throat and trouble swallowing.   Eyes: Negative for photophobia and visual disturbance.  Respiratory: Negative for cough and shortness of breath.   Cardiovascular: Negative for chest pain, palpitations and leg swelling.  Gastrointestinal: Negative for abdominal pain, blood in stool, constipation and diarrhea.  Endocrine: Negative for polydipsia, polyphagia and polyuria.  Genitourinary: Negative for dysuria and frequency.  Musculoskeletal: Positive for arthralgias (left foot) and gait problem (using walker to be non-weight bearing). Negative for back pain.  Neurological: Negative for dizziness and headaches.  Hematological: Negative for adenopathy. Does not bruise/bleed easily.    Objective:   Today's Vitals: BP 101/68 (BP Location: Left Arm, Patient Position: Sitting, Cuff Size: Normal)   Pulse 90   Temp 98.4 F (36.9 C) (Oral)   Resp 18   Ht _0  (  1.803 m)   Wt 165 lb (74.8 kg)   SpO2 98%   BMI 23.01 kg/m   Physical Exam Constitutional:      General: He is not in acute distress.    Appearance: Normal appearance. He is not ill-appearing.     Comments: WNWD adult male in NAD initially sitting in exam chair behind a walker  Neck:     Musculoskeletal: Normal range of motion and neck supple.  Cardiovascular:     Rate and Rhythm: Normal rate and regular  rhythm.     Pulses:          Dorsalis pedis pulses are 0 on the right side and 0 on the left side.       Posterior tibial pulses are 0 on the right side and 0 on the left side.  Pulmonary:     Effort: Pulmonary effort is normal.     Breath sounds: Normal breath sounds.  Abdominal:     General: Abdomen is flat. Bowel sounds are normal.     Palpations: Abdomen is soft.     Tenderness: There is no abdominal tenderness. There is no right CVA tenderness, left CVA tenderness, guarding or rebound.  Musculoskeletal:        General: Swelling, tenderness and signs of injury (sutured post surgical wound s/p amputation of great toe) present.     Right lower leg: No edema.     Left lower leg: No edema (edema at postsurgical site otherwise no edema of LE).       Feet:  Feet:     Right foot:     Skin integrity: Callus and dry skin present.     Toenail Condition: Right toenails are normal.     Left foot:     Skin integrity: Warmth and dry skin present.     Toenail Condition: Left toenails are normal.     Comments: Patient with dried light red discharge from site of sutures on site of amputation of the right great toe. Area is edematous with increased warmth and tender to palp. Pulses non-palpable on both feet but no cyanosis and capillary refill of less than 2 seconds Lymphadenopathy:     Cervical: No cervical adenopathy.  Skin:    Capillary Refill: Capillary refill takes less than 2 seconds.     Findings: Lesion (sutures in place on left foot s/p great toe amputation) present. No erythema or rash.  Neurological:     General: No focal deficit present.     Mental Status: He is alert and oriented to person, place, and time.  Psychiatric:        Mood and Affect: Mood normal.        Behavior: Behavior normal.        Thought Content: Thought content normal.        Judgment: Judgment normal.     Assessment & Plan:  1. Diabetic polyneuropathy associated with type 2 diabetes mellitus ([place]) Patient  is status post hospitalization for osteomyelitis of the left great toe which required amputation.  Patient reports that he does have an over-the-counter glucometer but prescription was sent to this pharmacy for patient to obtain a glucometer as well as lancets and strips as this will be less costly for the patient over time.  Patient will continue the use of metformin and patient had been taking his 70/30 insulin incorrectly and patient was instructed that instead of 3 times daily, he needs to take the 70/30 twice daily before his  first meal of the day and before the last meal of the day due to long and short acting components of this insulin formulation.  He will have a BMP in follow-up of diabetes and CBC in follow-up of osteomyelitis at today's visit. - Glucose (CBG) - metFORMIN (GLUCOPHAGE) 1000 MG tablet; Take 1 tablet (1,000 mg total) by mouth 2 (two) times daily with a meal.  Dispense: 180 tablet; Refill: 1 - insulin aspart protamine- aspart (NOVOLOG MIX 70/30) (70-30) 100 UNIT/ML injection; Inject 0.1 mLs (10 Units total) into the skin 2 (two) times daily with a meal.  Dispense: 12 mL; Refill: 5 - Blood Glucose Monitoring Suppl (TRUE METRIX METER) w/Device KIT; Use to check blood sugars up to  3 times daily  Dispense: 1 kit; Refill: 0 - glucose blood (TRUE METRIX BLOOD GLUCOSE TEST) test strip; Use as instructed to check blood sugars up to 3 x daily  Dispense: 100 each; Refill: 12 - TRUEplus Lancets 28G MISC; Use to help check blood sugars up to 3 times per day  Dispense: 100 each; Refill: 11 - Comprehensive metabolic panel - CBC with Differential  2. Osteomyelitis of great toe of left foot ([place]) Patient is status post amputation of the left great toe due to osteomyelitis.  Patient noticed onset of some bleeding at the area of the wound through the bandages yesterday.  Patient does feel that his foot is less swollen since this bleeding occurred.  On exam however patient does have some increased  warmth as well as some tenderness at the wound site.  Patient will be placed on Augmentin and patient is being referred to orthopedic surgeon who specializes in diabetic foot wounds/osteomyelitis.  Patient will have CBC in follow-up of osteomyelitis as patient did have leukocytosis during his hospitalization and patient will have CMP in follow-up of diabetes.  Patient did not have dressing changed between hospital discharge and today and his wound was redressed by CMA while he was here in the office.- amoxicillin-clavulanate (AUGMENTIN) 500-125 MG tablet; Take 1 tablet (500 mg total) by mouth 2 (two) times daily. Take after eating. To treat infection  Dispense: 20 tablet; Refill: 0 - Comprehensive metabolic panel - CBC with Differential - AMB referral to orthopedics  3. Amputation of left great toe [facility]) Patient is status post amputation of the left great toe and has had recent increase in pain.  New prescription provided for oxycodone which patient was prescribed post discharge.  Patient was made aware that narcotic medication can cause dependence/addiction.  Patient also made aware that medication can cause constipation.  Patient will be referred to orthopedics for further evaluation of recent increase in foot pain as well as drainage status post recent amputation of the left great toe secondary to osteomyelitis - oxyCODONE (OXY IR/ROXICODONE) 5 MG immediate release tablet; Take 1 tablet (5 mg total) by mouth every 6 (six) hours as needed for moderate pain.  Dispense: 28 tablet; Refill: 0 - AMB referral to orthopedics  4. Hospital discharge follow-up Patient's hospital notes, H&P as well as discharge summary as well as labs and imaging from his recent hospitalization were reviewed and findings were discussed with the patient.  Patient had actually been taking his insulin incorrectly as he was taking his 70/30 insulin 3 times per day and I discussed with the patient that he only needed to take the  medication twice daily and discussed short acting and long-acting components and that he should take the medication prior to first meal the day and  his last meal of the day.  Allergies as of 08/05/2018      Reactions   Bee Venom Hives      Medication List       Accurate as of August 05, 2018  5:51 PM. Always use your most recent med list.        amoxicillin-clavulanate 500-125 MG tablet Commonly known as:  Augmentin Take 1 tablet (500 mg total) by mouth 2 (two) times daily. Take after eating. To treat infection   glucose blood test strip Commonly known as:  True Metrix Blood Glucose Test Use as instructed to check blood sugars up to 3 x daily   insulin aspart protamine- aspart (70-30) 100 UNIT/ML injection Commonly known as:  NOVOLOG MIX 70/30 Inject 0.1 mLs (10 Units total) into the skin 2 (two) times daily with a meal.   metFORMIN 1000 MG tablet Commonly known as:  GLUCOPHAGE Take 1 tablet (1,000 mg total) by mouth 2 (two) times daily with a meal.   oxyCODONE 5 MG immediate release tablet Commonly known as:  Oxy IR/ROXICODONE Take 1 tablet (5 mg total) by mouth every 6 (six) hours as needed for moderate pain.   True Metrix Meter w/Device Kit Use to check blood sugars up to  3 times daily   TRUEplus Lancets 28G Misc Use to help check blood sugars up to 3 times per day       Follow-up: Return in about 1 week (around 08/12/2018) for redressing of wound-nurse visit in 1 week.  Antony Blackbird, MD

## 2018-08-06 ENCOUNTER — Telehealth (INDEPENDENT_AMBULATORY_CARE_PROVIDER_SITE_OTHER): Payer: Self-pay | Admitting: Radiology

## 2018-08-06 LAB — CBC WITH DIFFERENTIAL/PLATELET
Basophils Absolute: 0 x10E3/uL (ref 0.0–0.2)
Basos: 1 %
EOS (ABSOLUTE): 0.1 x10E3/uL (ref 0.0–0.4)
Eos: 2 %
Hematocrit: 47.3 % (ref 37.5–51.0)
Hemoglobin: 15.5 g/dL (ref 13.0–17.7)
Immature Grans (Abs): 0 x10E3/uL (ref 0.0–0.1)
Immature Granulocytes: 0 %
Lymphocytes Absolute: 1.7 x10E3/uL (ref 0.7–3.1)
Lymphs: 30 %
MCH: 30.1 pg (ref 26.6–33.0)
MCHC: 32.8 g/dL (ref 31.5–35.7)
MCV: 92 fL (ref 79–97)
Monocytes Absolute: 0.3 x10E3/uL (ref 0.1–0.9)
Monocytes: 6 %
Neutrophils Absolute: 3.6 x10E3/uL (ref 1.4–7.0)
Neutrophils: 61 %
Platelets: 340 x10E3/uL (ref 150–450)
RBC: 5.15 x10E6/uL (ref 4.14–5.80)
RDW: 12.7 % (ref 11.6–15.4)
WBC: 5.8 x10E3/uL (ref 3.4–10.8)

## 2018-08-06 LAB — COMPREHENSIVE METABOLIC PANEL WITH GFR
ALT: 37 IU/L (ref 0–44)
AST: 20 IU/L (ref 0–40)
Albumin/Globulin Ratio: 1.6 (ref 1.2–2.2)
Albumin: 4.7 g/dL (ref 4.0–5.0)
Alkaline Phosphatase: 99 IU/L (ref 39–117)
BUN/Creatinine Ratio: 24 — ABNORMAL HIGH (ref 9–20)
BUN: 18 mg/dL (ref 6–24)
Bilirubin Total: 0.3 mg/dL (ref 0.0–1.2)
CO2: 28 mmol/L (ref 20–29)
Calcium: 9.8 mg/dL (ref 8.7–10.2)
Chloride: 97 mmol/L (ref 96–106)
Creatinine, Ser: 0.76 mg/dL (ref 0.76–1.27)
GFR calc Af Amer: 124 mL/min/1.73
GFR calc non Af Amer: 107 mL/min/1.73
Globulin, Total: 3 g/dL (ref 1.5–4.5)
Glucose: 108 mg/dL — ABNORMAL HIGH (ref 65–99)
Potassium: 4.9 mmol/L (ref 3.5–5.2)
Sodium: 139 mmol/L (ref 134–144)
Total Protein: 7.7 g/dL (ref 6.0–8.5)

## 2018-08-06 NOTE — Telephone Encounter (Signed)
I tried calling patient to ask pre screening questions for appointment on 4/8 but patients voicemail box has not been set up yet so unable to leave message.

## 2018-08-07 ENCOUNTER — Ambulatory Visit (INDEPENDENT_AMBULATORY_CARE_PROVIDER_SITE_OTHER): Payer: Self-pay | Admitting: Physician Assistant

## 2018-08-07 ENCOUNTER — Telehealth: Payer: Self-pay | Admitting: *Deleted

## 2018-08-07 ENCOUNTER — Encounter (INDEPENDENT_AMBULATORY_CARE_PROVIDER_SITE_OTHER): Payer: Self-pay | Admitting: Family

## 2018-08-07 ENCOUNTER — Other Ambulatory Visit: Payer: Self-pay

## 2018-08-07 VITALS — Ht 71.0 in | Wt 165.0 lb

## 2018-08-07 DIAGNOSIS — M869 Osteomyelitis, unspecified: Secondary | ICD-10-CM

## 2018-08-07 NOTE — Progress Notes (Signed)
   Post-Op Visit Note   Patient: Jon House           Date of Birth: 1971-04-29           MRN: [id number] Visit Date: 08/07/2018 PCP: Patient, No Pcp Per   Assessment & Plan:  Chief Complaint:  Chief Complaint  Patient presents with  . Left Foot - Routine Post Op    07/31/2018 left GT amputation    Visit Diagnoses:  1. Osteomyelitis of great toe of left foot (HCC)     Plan: Patient is a pleasant 48 year old gentleman who presents to our clinic today 1 week status post left great toe amputation, date of surgery 07/31/2018.  He has been heel weightbearing in a postoperative shoe and ambulating with the use of a walker.  No fevers or chills.  Very minimal pain.  No drainage.  Examination of his left great toe reveals a well-healing surgical incision with nylon sutures in place.  No evidence of infection or cellulitis.  At this point, it is too early to remove his sutures.  We will continue heel weightbearing in a postoperative shoe.  Dressing was reapplied.  Follow-up with Dr. Lajoyce Corners in 1 week for suture removal.  Call with concerns or questions in the meantime.  Follow-Up Instructions: Return in about 1 week (around 08/14/2018) for DUDA (suture removal).   Orders:  No orders of the defined types were placed in this encounter.  No orders of the defined types were placed in this encounter.   Imaging: No new imaging  PMFS History: Patient Active Problem List   Diagnosis Date Noted  . Osteomyelitis of great toe of left foot (HCC) 07/29/2018  . Diabetic polyneuropathy associated with type 2 diabetes mellitus (HCC)    Past Medical History:  Diagnosis Date  . Diabetes mellitus without complication (HCC)     Family History  Problem Relation Age of Onset  . Diabetes Mother     Past Surgical History:  Procedure Laterality Date  . AMPUTATION Left 07/31/2018   Procedure: LEFT GREAT TOE AMPUTATION;  Surgeon: [name], MD;  Location: [facility] OR;  Service: Orthopedics;  Laterality:  Left;   Social History   Occupational History  . Not on file  Tobacco Use  . Smoking status: Never Smoker  . Smokeless tobacco: Never Used  Substance and Sexual Activity  . Alcohol use: Yes    Comment: sometimes  . Drug use: Never  . Sexual activity: Not on file

## 2018-08-07 NOTE — Telephone Encounter (Signed)
Medical Assistant used [employer] to contact patient.  Interpreter Name:Oscar Interpreter #: [phone] Patient verified DOB Patient is aware of labs being normal and to follow up as planned. Patient had no further questions and will follow up as planned.

## 2018-08-07 NOTE — Telephone Encounter (Signed)
-----   Message from Cain Saupe, MD sent at 08/06/2018  5:56 PM EDT ----- Normal complete blood count and complete metabolic panel normal (if non-fasting) with glucose of 108

## 2018-08-08 ENCOUNTER — Ambulatory Visit: Payer: Self-pay | Admitting: Internal Medicine

## 2018-08-08 ENCOUNTER — Encounter: Payer: Self-pay | Admitting: Internal Medicine

## 2018-08-08 DIAGNOSIS — E1142 Type 2 diabetes mellitus with diabetic polyneuropathy: Secondary | ICD-10-CM

## 2018-08-08 NOTE — Progress Notes (Signed)
Pt was scheduled [facility] at [facility] in error.  Pt was seen for HFU and to establish care with [facility] on 4/6.  Pt thought he was here to see "specialist" (ortho), but that appt is scheduled for 4/15 with [facility], which pt is aware of.  No further action needed at this time. Encounter will be close for administrative purposes.Criss Alvine, Zola Runion Cassady4/9/202011:13 AM

## 2018-08-14 ENCOUNTER — Encounter (INDEPENDENT_AMBULATORY_CARE_PROVIDER_SITE_OTHER): Payer: Self-pay | Admitting: Family

## 2018-08-14 ENCOUNTER — Other Ambulatory Visit: Payer: Self-pay

## 2018-08-14 ENCOUNTER — Ambulatory Visit (INDEPENDENT_AMBULATORY_CARE_PROVIDER_SITE_OTHER): Payer: Self-pay | Admitting: Family

## 2018-08-14 VITALS — Ht 71.0 in | Wt 165.0 lb

## 2018-08-14 DIAGNOSIS — M869 Osteomyelitis, unspecified: Secondary | ICD-10-CM

## 2018-08-14 DIAGNOSIS — S98112A Complete traumatic amputation of left great toe, initial encounter: Secondary | ICD-10-CM

## 2018-08-14 DIAGNOSIS — Z89412 Acquired absence of left great toe: Secondary | ICD-10-CM

## 2018-08-14 NOTE — Progress Notes (Signed)
   Post-Op Visit Note   Patient: Jon House           Date of Birth: 12-26-1970           MRN: [id number] Visit Date: 08/14/2018 PCP: Patient, No Pcp Per   Assessment & Plan:  Chief Complaint:  Chief Complaint  Patient presents with  . Left Foot - Routine Post Op    07/31/2018 left GT amputation    Visit Diagnoses:  No diagnosis found.  Plan: Patient is a pleasant 48 year old gentleman who presents to our clinic today 2 weeks status post left great toe amputation, date of surgery 07/31/2018.  He has been heel weightbearing in a postoperative shoe and ambulating with the use of a walker.  No fevers or chills.  Very minimal pain.  No drainage.  Examination of his left great toe reveals a well-healed surgical incision with nylon sutures in place.  No evidence of infection or cellulitis.  Suture removal today. Continue to keep incision clean and dry. Advance weight bearing as tolerated.  Call with concerns or questions in the meantime.  Follow-Up Instructions: No follow-ups on file.   Orders:  No orders of the defined types were placed in this encounter.  No orders of the defined types were placed in this encounter.   Imaging: No new imaging  PMFS History: Patient Active Problem List   Diagnosis Date Noted  . Osteomyelitis of great toe of left foot (HCC) 07/29/2018  . Diabetic polyneuropathy associated with type 2 diabetes mellitus (HCC)    Past Medical History:  Diagnosis Date  . Diabetes mellitus without complication (HCC)     Family History  Problem Relation Age of Onset  . Diabetes Mother     Past Surgical History:  Procedure Laterality Date  . AMPUTATION Left 07/31/2018   Procedure: LEFT GREAT TOE AMPUTATION;  Surgeon: [name], MD;  Location: [facility] OR;  Service: Orthopedics;  Laterality: Left;   Social History   Occupational History  . Not on file  Tobacco Use  . Smoking status: Former Games developer  . Smokeless tobacco: Never Used  Substance and Sexual  Activity  . Alcohol use: Yes    Comment: sometimes  . Drug use: Never  . Sexual activity: Not on file

## 2018-08-21 ENCOUNTER — Ambulatory Visit (INDEPENDENT_AMBULATORY_CARE_PROVIDER_SITE_OTHER): Payer: Self-pay | Admitting: Family

## 2018-09-09 ENCOUNTER — Other Ambulatory Visit: Payer: Self-pay

## 2018-09-09 ENCOUNTER — Ambulatory Visit (INDEPENDENT_AMBULATORY_CARE_PROVIDER_SITE_OTHER): Payer: Self-pay | Admitting: Orthopedic Surgery

## 2018-09-09 ENCOUNTER — Encounter: Payer: Self-pay | Admitting: Orthopedic Surgery

## 2018-09-09 VITALS — Ht 71.0 in | Wt 165.0 lb

## 2018-09-09 DIAGNOSIS — Z89412 Acquired absence of left great toe: Secondary | ICD-10-CM

## 2018-09-09 DIAGNOSIS — S98112A Complete traumatic amputation of left great toe, initial encounter: Secondary | ICD-10-CM

## 2018-09-09 NOTE — Progress Notes (Signed)
   Office Visit Note   Patient: Jon House           Date of Birth: 1970/10/01           MRN: [id number] Visit Date: 09/09/2018              Requested by: No referring provider defined for this encounter. PCP: Patient, No Pcp Per  Chief Complaint  Patient presents with  . Left Foot - Routine Post Op    07/31/18 left foot great toe amp      HPI: The patient is a 48 yo gentleman who is seen for post operative follow up following left great toe amputation for osteomyelitis on 07/31/2018. He is full weight bearing and wearing sandals. He is concerned about some post operative swelling in the left foot. He is not currently working.  He is seen today with the assistance of a Spanish interpretor.   Assessment & Plan: Visit Diagnoses:  1. Amputation of left great toe (HCC)     Plan: Instructed to wear covered shoes to protect feel as much as possible. Prescription given for medical compression socks, large size. Follow up prn.   Follow-Up Instructions: Return if symptoms worsen or fail to improve.   Ortho Exam  Patient is alert, oriented, no adenopathy, well-dressed, normal affect, normal respiratory effort. The left great toe incision is well healed. There are no signs of infection or cellulitis. Mild edema over the amputation site. Good palpable pedal pulses. Non tender to palpation. Ambulates with normal appearing gait.   Imaging: No results found.   Labs: Lab Results  Component Value Date   HGBA1C 11.1 (H) 07/29/2018     Lab Results  Component Value Date   ALBUMIN 4.7 08/05/2018   ALBUMIN 3.9 07/29/2018    Body mass index is 23.01 kg/m.  Orders:  No orders of the defined types were placed in this encounter.  No orders of the defined types were placed in this encounter.    Procedures: No procedures performed  Clinical Data: No additional findings.  ROS:  All other systems negative, except as noted in the HPI. Review of Systems  Objective:  Vital Signs: Ht 5\' 11"  (1.803 m)   Wt 165 lb (74.8 kg)   BMI 23.01 kg/m   Specialty Comments:  No specialty comments available.  PMFS History: Patient Active Problem List   Diagnosis Date Noted  . Osteomyelitis of great toe of left foot (HCC) 07/29/2018  . Diabetic polyneuropathy associated with type 2 diabetes mellitus (HCC)    Past Medical History:  Diagnosis Date  . Diabetes mellitus without complication (HCC)     Family History  Problem Relation Age of Onset  . Diabetes Mother     Past Surgical History:  Procedure Laterality Date  . AMPUTATION Left 07/31/2018   Procedure: LEFT GREAT TOE AMPUTATION;  Surgeon: [name], MD;  Location: [facility] OR;  Service: Orthopedics;  Laterality: Left;   Social History   Occupational History  . Not on file  Tobacco Use  . Smoking status: Former Games developer  . Smokeless tobacco: Never Used  Substance and Sexual Activity  . Alcohol use: Yes    Comment: sometimes  . Drug use: Never  . Sexual activity: Not on file

## 2018-10-01 ENCOUNTER — Telehealth: Payer: Self-pay | Admitting: Family Medicine

## 2018-10-01 NOTE — Telephone Encounter (Signed)
Patient called stating they have a nail wound in their foot. Please follow up.

## 2018-10-01 NOTE — Telephone Encounter (Signed)
Patients call returned.  Patient identified by name and date of birth.  Patient states he stepped on a nail yesterday.  Patient recently had surgery on his right foot with a digit amputation and is concerned for infection.    Patient has an appointment tomorrow.  Patient advised to make sure he is at his appointment.  Patient acknowledged understanding of advice.

## 2018-10-02 ENCOUNTER — Ambulatory Visit: Payer: Self-pay | Admitting: Nurse Practitioner

## 2018-10-02 ENCOUNTER — Encounter: Payer: Self-pay | Admitting: Nurse Practitioner

## 2018-10-02 ENCOUNTER — Telehealth: Payer: Self-pay | Admitting: Family Medicine

## 2018-10-02 ENCOUNTER — Ambulatory Visit: Payer: Self-pay | Attending: Nurse Practitioner | Admitting: Nurse Practitioner

## 2018-10-02 ENCOUNTER — Other Ambulatory Visit: Payer: Self-pay

## 2018-10-02 VITALS — BP 97/61 | HR 102 | Temp 98.9°F | Ht 71.0 in | Wt 171.0 lb

## 2018-10-02 DIAGNOSIS — S91332D Puncture wound without foreign body, left foot, subsequent encounter: Secondary | ICD-10-CM

## 2018-10-02 DIAGNOSIS — E118 Type 2 diabetes mellitus with unspecified complications: Secondary | ICD-10-CM

## 2018-10-02 DIAGNOSIS — E1165 Type 2 diabetes mellitus with hyperglycemia: Secondary | ICD-10-CM

## 2018-10-02 LAB — POCT GLYCOSYLATED HEMOGLOBIN (HGB A1C): Hemoglobin A1C: 9.6 % — AB (ref 4.0–5.6)

## 2018-10-02 LAB — GLUCOSE, POCT (MANUAL RESULT ENTRY): POC Glucose: 189 mg/dl — AB (ref 70–99)

## 2018-10-02 MED ORDER — INSULIN PEN NEEDLE 31G X 5 MM MISC: 99 refills | Status: DC

## 2018-10-02 MED ORDER — GLUCOSE BLOOD VI STRP: ORAL_STRIP | 12 refills | Status: DC

## 2018-10-02 MED ORDER — BASAGLAR KWIKPEN 100 UNIT/ML ~~LOC~~ SOPN: 15.0000 [IU] | PEN_INJECTOR | Freq: Every day | SUBCUTANEOUS | 99 refills | Status: DC

## 2018-10-02 MED ORDER — CEPHALEXIN 500 MG PO CAPS: 500.0000 mg | ORAL_CAPSULE | Freq: Four times a day (QID) | ORAL | 0 refills | Status: DC

## 2018-10-02 MED FILL — TRUEPLUS PEN NDL 31GX3/16: 31G X 5 MM | 100 days supply | Qty: 100 | Fill #0

## 2018-10-02 MED FILL — ?BASAGLAR 100 UNITS/ML KWPE: 100 | 40 days supply | Qty: 6 | Fill #0

## 2018-10-02 MED FILL — TRUEPLUS PEN NDL 31GX3/16": 31G X 5 MM | 100 days supply | Qty: 100 | Fill #0

## 2018-10-02 MED FILL — TRUE METRIX TEST STRIP: 30 days supply | Qty: 100 | Fill #1

## 2018-10-02 MED FILL — CEPHALEXIN 500 MG CAPSULE: 500 | 7 days supply | Qty: 28 | Fill #0

## 2018-10-02 NOTE — Telephone Encounter (Signed)
Pt was called since I received an application only for the [place] he need to apply for CAFA and well provide all the documents that is necessary for his approval

## 2018-10-02 NOTE — Progress Notes (Signed)
Assessment & Plan:  Jon House was seen today for wound infection.  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 -     Insulin Pen Needle (B-D UF III MINI PEN NEEDLES) 31G X 5 MM MISC; Use as instructed. Inject into the skin once nightly. -     glucose blood (TRUE METRIX BLOOD GLUCOSE TEST) test strip; Use as instructed to check blood sugars up to 3 x daily -     Ambulatory referral to Ophthalmology  Penetrating wound of left foot, subsequent encounter -     CBC -     cephALEXin (KEFLEX) 500 MG capsule; Take 1 capsule (500 mg total) by mouth 4 (four) times daily for 7 days. Patient was instructed to go to the ED immediately if he experiences increased left foot swelling, fever chills, erythema, increased pain, loss of sensation, purulent drainage, or uncontrollable bleeding from the area. Tetanus vaccine was administered today  Patient has been counseled on age-appropriate routine health concerns for screening and prevention. These are reviewed and up-to-date. Referrals have been placed accordingly. Immunizations are up-to-date or declined.    Subjective:   Chief Complaint  Patient presents with  . Wound Infection    Pt. stated he stepped on a nail on his amputated foot.    [name] 48 y.o. male presents to office today foot pain.  Wound Check: Patient presents for wound check of left foot. Current symptoms: ruptured blister (fourth left toe), edema, and pain: moderate.  Symptoms began 3 days ago. Pain is rated 8/10. Interventions to date: none. Went to work on Monday and a Architect nail went through his boot and pierced his foot.       DM TYPE 2 Poorly controlled. Will  add lantus 15 units today. He endorses medication compliance taking metformin 1000 mg twice daily and NovoLog 70/30 10 units twice daily.  Hypoglycemic symptoms include diabetic polyneuropathy.  He is overdue for eye exam.  Comprehensive foot exam was performed today.  Currently not taking an ACE/ARB or statin.  Will defer to PCP regarding initiation.  Lipid profile ordered today as well as microalbumin. Lab Results  Component Value Date   HGBA1C 9.6 (A) 10/02/2018   Lab Results  Component Value Date   HGBA1C 11.1 (H) 07/29/2018    Review of Systems  Constitutional: Negative for fever, malaise/fatigue and weight loss.  HENT: Negative.  Negative for nosebleeds.   Eyes: Negative.  Negative for blurred vision, double vision and photophobia.  Respiratory: Negative.  Negative for cough and shortness of breath.   Cardiovascular: Negative.  Negative for chest pain, palpitations and leg swelling.  Gastrointestinal: Negative.  Negative for heartburn, nausea and vomiting.  Musculoskeletal: Negative for myalgias.       Left great toe amputation  Skin:       See HPI  Neurological: Negative.  Negative for dizziness, focal weakness, seizures and headaches.  Psychiatric/Behavioral: Negative.  Negative for suicidal ideas.    Past Medical History:  Diagnosis Date  . Diabetes mellitus without complication [facility])     Past Surgical History:  Procedure Laterality Date  .  AMPUTATION Left 07/31/2018   Procedure: LEFT GREAT TOE AMPUTATION;  Surgeon: Newt Minion, MD;  Location: [place];  Service: Orthopedics;  Laterality: Left;    Family History  Problem Relation Age of Onset  . Diabetes Mother     Social History Reviewed with no changes to be made today.   Outpatient Medications Prior to Visit  Medication Sig Dispense Refill  . Blood Glucose Monitoring Suppl (TRUE METRIX METER) w/Device KIT Use to check blood sugars up to  3 times daily 1 kit 0  . insulin aspart protamine- aspart (NOVOLOG MIX  70/30) (70-30) 100 UNIT/ML injection Inject 0.1 mLs (10 Units total) into the skin 2 (two) times daily with a meal. 12 mL 5  . metFORMIN (GLUCOPHAGE) 1000 MG tablet Take 1 tablet (1,000 mg total) by mouth 2 (two) times daily with a meal. 180 tablet 1  . TRUEplus Lancets 28G MISC Use to help check blood sugars up to 3 times per day 100 each 11  . amoxicillin-clavulanate (AUGMENTIN) 500-125 MG tablet Take 1 tablet (500 mg total) by mouth 2 (two) times daily. Take after eating. To treat infection 20 tablet 0  . glucose blood (TRUE METRIX BLOOD GLUCOSE TEST) test strip Use as instructed to check blood sugars up to 3 x daily 100 each 12  . oxyCODONE (OXY IR/ROXICODONE) 5 MG immediate release tablet Take 1 tablet (5 mg total) by mouth every 6 (six) hours as needed for moderate pain. (Patient not taking: Reported on 10/02/2018) 28 tablet 0   No facility-administered medications prior to visit.     Allergies  Allergen Reactions  . Bee Venom Hives       Objective:    BP 97/61 (BP Location: Left Arm, Patient Position: Sitting, Cuff Size: Normal)   Pulse (!) 102   Temp 98.9 F (37.2 C) (Oral)   Ht '5\' 11"'  (1.803 m)   Wt 171 lb (77.6 kg)   SpO2 100%   BMI 23.85 kg/m  Wt Readings from Last 3 Encounters:  10/02/18 171 lb (77.6 kg)  09/09/18 165 lb (74.8 kg)  08/14/18 165 lb (74.8 kg)    Physical Exam Vitals signs and nursing note reviewed.  Constitutional:      Appearance: He is well-developed.  HENT:     Head: Normocephalic and atraumatic.  Neck:     Musculoskeletal: Normal range of motion.  Cardiovascular:     Rate and Rhythm: Regular rhythm. Tachycardia present.     Pulses:          Posterior tibial pulses are 2+ on the right side and 2+ on the left side.     Heart sounds: Normal heart sounds. No murmur. No friction rub. No gallop.   Pulmonary:     Effort: Pulmonary effort is normal. No tachypnea or respiratory distress.     Breath sounds: Normal breath sounds. No decreased breath  sounds, wheezing, rhonchi or rales.  Chest:     Chest wall: No tenderness.  Abdominal:     General: Bowel sounds are normal.     Palpations: Abdomen is soft.  Musculoskeletal: Normal range of motion.  Feet:     Right foot:     Protective Sensation: 10 sites tested. 8 sites sensed.     Toenail Condition: Fungal disease present.    Left foot:     Amputation: (Left great toe)    Protective Sensation: 10 sites tested. 7 sites sensed.     Skin integrity: Blister (Fourth left toe), skin  breakdown and warmth present.     Toenail Condition: Fungal disease present. Skin:    General: Skin is warm and dry.  Neurological:     Mental Status: He is alert and oriented to person, place, and time.     Coordination: Coordination normal.  Psychiatric:        Behavior: Behavior normal. Behavior is cooperative.        Thought Content: Thought content normal.        Judgment: Judgment normal.         Patient has been counseled extensively about nutrition and exercise as well as the importance of adherence with medications and regular follow-up. The patient was given clear instructions to go to ER or return to medical center if symptoms don't improve, worsen or new problems develop. The patient verbalized understanding.   Follow-up: Return in about 1 week (around 10/09/2018) for foot check with FULP/PCP.   Gildardo Pounds, FNP-BC [facility] [place] [place]   10/02/2018, 10:52 AM

## 2018-10-03 ENCOUNTER — Encounter (HOSPITAL_COMMUNITY): Payer: Self-pay | Admitting: *Deleted

## 2018-10-03 ENCOUNTER — Emergency Department (HOSPITAL_COMMUNITY): Payer: Medicaid Other

## 2018-10-03 ENCOUNTER — Other Ambulatory Visit: Payer: Self-pay

## 2018-10-03 ENCOUNTER — Inpatient Hospital Stay (HOSPITAL_COMMUNITY)
Admission: EM | Admit: 2018-10-03 | Discharge: 2018-10-11 | DRG: 616 | Disposition: A | Discharge status: Home Health | Payer: Medicaid Other | Attending: Family Medicine | Admitting: Family Medicine

## 2018-10-03 DIAGNOSIS — Z79899 Other long term (current) drug therapy: Secondary | ICD-10-CM

## 2018-10-03 DIAGNOSIS — Z79891 Long term (current) use of opiate analgesic: Secondary | ICD-10-CM

## 2018-10-03 DIAGNOSIS — M86272 Subacute osteomyelitis, left ankle and foot: Secondary | ICD-10-CM

## 2018-10-03 DIAGNOSIS — D631 Anemia in chronic kidney disease: Secondary | ICD-10-CM | POA: Diagnosis present

## 2018-10-03 DIAGNOSIS — T380X5A Adverse effect of glucocorticoids and synthetic analogues, initial encounter: Secondary | ICD-10-CM | POA: Diagnosis present

## 2018-10-03 DIAGNOSIS — L97509 Non-pressure chronic ulcer of other part of unspecified foot with unspecified severity: Secondary | ICD-10-CM | POA: Diagnosis present

## 2018-10-03 DIAGNOSIS — E11628 Type 2 diabetes mellitus with other skin complications: Secondary | ICD-10-CM | POA: Diagnosis present

## 2018-10-03 DIAGNOSIS — Z1159 Encounter for screening for other viral diseases: Secondary | ICD-10-CM

## 2018-10-03 DIAGNOSIS — Z89412 Acquired absence of left great toe: Secondary | ICD-10-CM

## 2018-10-03 DIAGNOSIS — E876 Hypokalemia: Secondary | ICD-10-CM | POA: Diagnosis present

## 2018-10-03 DIAGNOSIS — Z833 Family history of diabetes mellitus: Secondary | ICD-10-CM

## 2018-10-03 DIAGNOSIS — E1169 Type 2 diabetes mellitus with other specified complication: Secondary | ICD-10-CM | POA: Diagnosis present

## 2018-10-03 DIAGNOSIS — E11621 Type 2 diabetes mellitus with foot ulcer: Principal | ICD-10-CM | POA: Diagnosis present

## 2018-10-03 DIAGNOSIS — E43 Unspecified severe protein-calorie malnutrition: Secondary | ICD-10-CM | POA: Diagnosis present

## 2018-10-03 DIAGNOSIS — E1165 Type 2 diabetes mellitus with hyperglycemia: Secondary | ICD-10-CM | POA: Diagnosis present

## 2018-10-03 DIAGNOSIS — E1140 Type 2 diabetes mellitus with diabetic neuropathy, unspecified: Secondary | ICD-10-CM | POA: Diagnosis present

## 2018-10-03 DIAGNOSIS — L089 Local infection of the skin and subcutaneous tissue, unspecified: Secondary | ICD-10-CM

## 2018-10-03 DIAGNOSIS — Z794 Long term (current) use of insulin: Secondary | ICD-10-CM

## 2018-10-03 LAB — COMPREHENSIVE METABOLIC PANEL
ALT: 15 U/L (ref 0–44)
AST: 15 U/L (ref 15–41)
Albumin: 3.9 g/dL (ref 3.5–5.0)
Alkaline Phosphatase: 97 U/L (ref 38–126)
Anion gap: 8 (ref 5–15)
BUN: 17 mg/dL (ref 6–20)
CO2: 31 mmol/L (ref 22–32)
Calcium: 9 mg/dL (ref 8.9–10.3)
Chloride: 98 mmol/L (ref 98–111)
Creatinine, Ser: 0.87 mg/dL (ref 0.61–1.24)
GFR calc Af Amer: >60 mL/min (ref >60)
GFR calc non Af Amer: >60 mL/min (ref >60)
Glucose, Bld: 179 mg/dL — ABNORMAL HIGH (ref 70–99)
Potassium: 3.5 mmol/L (ref 3.5–5.1)
Sodium: 137 mmol/L (ref 135–145)
Total Bilirubin: 0.8 mg/dL (ref 0.3–1.2)
Total Protein: 7.6 g/dL (ref 6.5–8.1)

## 2018-10-03 LAB — CBC WITH DIFFERENTIAL/PLATELET
Abs Immature Granulocytes: 0.05 10*3/uL (ref 0.00–0.07)
Basophils Absolute: 0 10*3/uL (ref 0.0–0.1)
Basophils Relative: 0 %
Eosinophils Absolute: 0.1 10*3/uL (ref 0.0–0.5)
Eosinophils Relative: 1 %
HCT: 38.3 % — ABNORMAL LOW (ref 39.0–52.0)
Hemoglobin: 13.1 g/dL (ref 13.0–17.0)
Immature Granulocytes: 0 %
Lymphocytes Relative: 4 %
Lymphs Abs: 0.5 10*3/uL — ABNORMAL LOW (ref 0.7–4.0)
MCH: 30.1 pg (ref 26.0–34.0)
MCHC: 34.2 g/dL (ref 30.0–36.0)
MCV: 88 fL (ref 80.0–100.0)
Monocytes Absolute: 0.7 10*3/uL (ref 0.1–1.0)
Monocytes Relative: 6 %
Neutro Abs: 11.1 10*3/uL — ABNORMAL HIGH (ref 1.7–7.7)
Neutrophils Relative %: 89 %
Platelets: 194 10*3/uL (ref 150–400)
RBC: 4.35 MIL/uL (ref 4.22–5.81)
RDW: 12.2 % (ref 11.5–15.5)
WBC: 12.4 10*3/uL — ABNORMAL HIGH (ref 4.0–10.5)
nRBC: 0 % (ref 0.0–0.2)

## 2018-10-03 LAB — MICROALBUMIN / CREATININE URINE RATIO
Creatinine, Urine: 194 mg/dL
Microalb/Creat Ratio: 18 mg/g creat (ref 0–29)
Microalbumin, Urine: 35.5 ug/mL

## 2018-10-03 LAB — LIPID PANEL
Chol/HDL Ratio: 4 ratio (ref 0.0–5.0)
Cholesterol, Total: 168 mg/dL (ref 100–199)
HDL: 42 mg/dL (ref >39)
LDL Calculated: 108 mg/dL — ABNORMAL HIGH (ref 0–99)
Triglycerides: 89 mg/dL (ref 0–149)
VLDL Cholesterol Cal: 18 mg/dL (ref 5–40)

## 2018-10-03 LAB — CBC
Hematocrit: 41.3 % (ref 37.5–51.0)
Hemoglobin: 14.1 g/dL (ref 13.0–17.7)
MCH: 30.1 pg (ref 26.6–33.0)
MCHC: 34.1 g/dL (ref 31.5–35.7)
MCV: 88 fL (ref 79–97)
Platelets: 200 10*3/uL (ref 150–450)
RBC: 4.69 x10E6/uL (ref 4.14–5.80)
RDW: 12.9 % (ref 11.6–15.4)
WBC: 10.9 10*3/uL — ABNORMAL HIGH (ref 3.4–10.8)

## 2018-10-03 LAB — LACTIC ACID, PLASMA: Lactic Acid, Venous: 1.2 mmol/L (ref 0.5–1.9)

## 2018-10-03 NOTE — ED Triage Notes (Signed)
Pt reports L foot infection.  He was at the [facility] and was instructed to come to the ED.  He also has a fever.  He has hx of DM.

## 2018-10-04 ENCOUNTER — Other Ambulatory Visit: Payer: Self-pay

## 2018-10-04 ENCOUNTER — Encounter (HOSPITAL_COMMUNITY): Payer: Self-pay | Admitting: General Practice

## 2018-10-04 DIAGNOSIS — B999 Unspecified infectious disease: Secondary | ICD-10-CM

## 2018-10-04 DIAGNOSIS — L089 Local infection of the skin and subcutaneous tissue, unspecified: Secondary | ICD-10-CM

## 2018-10-04 DIAGNOSIS — E1165 Type 2 diabetes mellitus with hyperglycemia: Secondary | ICD-10-CM

## 2018-10-04 DIAGNOSIS — Z794 Long term (current) use of insulin: Secondary | ICD-10-CM

## 2018-10-04 DIAGNOSIS — E11628 Type 2 diabetes mellitus with other skin complications: Secondary | ICD-10-CM

## 2018-10-04 HISTORY — DX: Unspecified infectious disease: B99.9

## 2018-10-04 LAB — CBC WITH DIFFERENTIAL/PLATELET
Abs Immature Granulocytes: 0.02 10*3/uL (ref 0.00–0.07)
Basophils Absolute: 0 10*3/uL (ref 0.0–0.1)
Basophils Relative: 0 %
Eosinophils Absolute: 0 10*3/uL (ref 0.0–0.5)
Eosinophils Relative: 0 %
HCT: 36.5 % — ABNORMAL LOW (ref 39.0–52.0)
Hemoglobin: 12.6 g/dL — ABNORMAL LOW (ref 13.0–17.0)
Immature Granulocytes: 0 %
Lymphocytes Relative: 7 %
Lymphs Abs: 0.6 10*3/uL — ABNORMAL LOW (ref 0.7–4.0)
MCH: 29.9 pg (ref 26.0–34.0)
MCHC: 34.5 g/dL (ref 30.0–36.0)
MCV: 86.7 fL (ref 80.0–100.0)
Monocytes Absolute: 0.7 10*3/uL (ref 0.1–1.0)
Monocytes Relative: 8 %
Neutro Abs: 7.6 10*3/uL (ref 1.7–7.7)
Neutrophils Relative %: 85 %
Platelets: 168 10*3/uL (ref 150–400)
RBC: 4.21 MIL/uL — ABNORMAL LOW (ref 4.22–5.81)
RDW: 12.2 % (ref 11.5–15.5)
WBC: 8.9 10*3/uL (ref 4.0–10.5)
nRBC: 0 % (ref 0.0–0.2)

## 2018-10-04 LAB — GLUCOSE, CAPILLARY
Glucose-Capillary: 210 mg/dL — ABNORMAL HIGH (ref 70–99)
Glucose-Capillary: 224 mg/dL — ABNORMAL HIGH (ref 70–99)
Glucose-Capillary: 236 mg/dL — ABNORMAL HIGH (ref 70–99)
Glucose-Capillary: 262 mg/dL — ABNORMAL HIGH (ref 70–99)

## 2018-10-04 LAB — C-REACTIVE PROTEIN: CRP: 19.7 mg/dL — ABNORMAL HIGH (ref <1.0)

## 2018-10-04 LAB — SARS CORONAVIRUS 2 BY RT PCR (HOSPITAL ORDER, PERFORMED IN ~~LOC~~ HOSPITAL LAB): SARS Coronavirus 2: NEGATIVE

## 2018-10-04 LAB — BASIC METABOLIC PANEL
Anion gap: 12 (ref 5–15)
BUN: 18 mg/dL (ref 6–20)
CO2: 26 mmol/L (ref 22–32)
Calcium: 8.6 mg/dL — ABNORMAL LOW (ref 8.9–10.3)
Chloride: 97 mmol/L — ABNORMAL LOW (ref 98–111)
Creatinine, Ser: 0.81 mg/dL (ref 0.61–1.24)
GFR calc Af Amer: >60 mL/min (ref >60)
GFR calc non Af Amer: >60 mL/min (ref >60)
Glucose, Bld: 243 mg/dL — ABNORMAL HIGH (ref 70–99)
Potassium: 3.3 mmol/L — ABNORMAL LOW (ref 3.5–5.1)
Sodium: 135 mmol/L (ref 135–145)

## 2018-10-04 LAB — PREALBUMIN: Prealbumin: 9.6 mg/dL — ABNORMAL LOW (ref 18–38)

## 2018-10-04 LAB — HIV ANTIBODY (ROUTINE TESTING W REFLEX): HIV Screen 4th Generation wRfx: NONREACTIVE

## 2018-10-04 LAB — SEDIMENTATION RATE: Sed Rate: 54 mm/hr — ABNORMAL HIGH (ref 0–16)

## 2018-10-04 MED ORDER — VANCOMYCIN HCL 10 G IV SOLR
1250.0000 mg | Freq: Two times a day (BID) | INTRAVENOUS | Status: AC
  Administered 2018-10-04 – 2018-10-10 (×14): 1250 mg via INTRAVENOUS
  Filled 2018-10-04 (×16): qty 1250

## 2018-10-04 MED ORDER — POTASSIUM CHLORIDE CRYS ER 20 MEQ PO TBCR
40.0000 meq | EXTENDED_RELEASE_TABLET | Freq: Once | ORAL | Status: AC
  Administered 2018-10-04: 40 meq via ORAL
  Filled 2018-10-04: qty 2

## 2018-10-04 MED ORDER — CIPROFLOXACIN IN D5W 400 MG/200ML IV SOLN
400.0000 mg | Freq: Once | INTRAVENOUS | Status: AC
  Administered 2018-10-04: 400 mg via INTRAVENOUS
  Filled 2018-10-04: qty 200

## 2018-10-04 MED ORDER — ONDANSETRON HCL 4 MG PO TABS: 4.0000 mg | ORAL_TABLET | Freq: Four times a day (QID) | ORAL | Status: DC | PRN

## 2018-10-04 MED ORDER — INSULIN ASPART 100 UNIT/ML ~~LOC~~ SOLN
0.0000 [IU] | Freq: Three times a day (TID) | SUBCUTANEOUS | Status: DC
  Administered 2018-10-04 (×2): 3 [IU] via SUBCUTANEOUS

## 2018-10-04 MED ORDER — HYDROCODONE-ACETAMINOPHEN 5-325 MG PO TABS
1.0000 | ORAL_TABLET | ORAL | Status: DC | PRN
  Administered 2018-10-09: 2 via ORAL
  Filled 2018-10-04: qty 2

## 2018-10-04 MED ORDER — JUVEN PO PACK
1.0000 | PACK | Freq: Two times a day (BID) | ORAL | Status: DC
  Administered 2018-10-04 – 2018-10-11 (×12): 1 via ORAL
  Filled 2018-10-04 (×17): qty 1

## 2018-10-04 MED ORDER — POLYETHYLENE GLYCOL 3350 17 G PO PACK: 17.0000 g | PACK | Freq: Every day | ORAL | Status: DC | PRN

## 2018-10-04 MED ORDER — VANCOMYCIN HCL 10 G IV SOLR
1500.0000 mg | Freq: Once | INTRAVENOUS | Status: AC
  Administered 2018-10-04: 1500 mg via INTRAVENOUS
  Filled 2018-10-04: qty 1500

## 2018-10-04 MED ORDER — SODIUM CHLORIDE 0.9% FLUSH: 3.0000 mL | INTRAVENOUS | Status: DC | PRN

## 2018-10-04 MED ORDER — INSULIN ASPART 100 UNIT/ML ~~LOC~~ SOLN
0.0000 [IU] | Freq: Three times a day (TID) | SUBCUTANEOUS | Status: DC
  Administered 2018-10-04 – 2018-10-07 (×8): 5 [IU] via SUBCUTANEOUS
  Administered 2018-10-07: 8 [IU] via SUBCUTANEOUS
  Administered 2018-10-08: 5 [IU] via SUBCUTANEOUS
  Administered 2018-10-08 – 2018-10-09 (×2): 2 [IU] via SUBCUTANEOUS
  Administered 2018-10-09: 8 [IU] via SUBCUTANEOUS
  Administered 2018-10-10: 3 [IU] via SUBCUTANEOUS
  Administered 2018-10-10: 8 [IU] via SUBCUTANEOUS
  Administered 2018-10-10: 3 [IU] via SUBCUTANEOUS
  Administered 2018-10-11: 2 [IU] via SUBCUTANEOUS
  Administered 2018-10-11: 3 [IU] via SUBCUTANEOUS

## 2018-10-04 MED ORDER — GLUCERNA SHAKE PO LIQD
237.0000 mL | Freq: Two times a day (BID) | ORAL | Status: DC
  Administered 2018-10-04 – 2018-10-11 (×11): 237 mL via ORAL
  Filled 2018-10-04 (×9): qty 237

## 2018-10-04 MED ORDER — SODIUM CHLORIDE 0.9 % IV SOLN
250.0000 mL | INTRAVENOUS | Status: DC | PRN
  Administered 2018-10-08 (×2): 250 mL via INTRAVENOUS

## 2018-10-04 MED ORDER — SODIUM CHLORIDE 0.9% FLUSH
3.0000 mL | Freq: Two times a day (BID) | INTRAVENOUS | Status: DC
  Administered 2018-10-04 – 2018-10-10 (×10): 3 mL via INTRAVENOUS

## 2018-10-04 MED ORDER — SODIUM CHLORIDE 0.9 % IV SOLN
2.0000 g | Freq: Every day | INTRAVENOUS | Status: DC
  Administered 2018-10-04: 06:00:00 2 g via INTRAVENOUS
  Filled 2018-10-04 (×2): qty 20

## 2018-10-04 MED ORDER — INSULIN ASPART 100 UNIT/ML ~~LOC~~ SOLN
0.0000 [IU] | Freq: Every day | SUBCUTANEOUS | Status: DC
  Administered 2018-10-04 – 2018-10-05 (×2): 3 [IU] via SUBCUTANEOUS
  Administered 2018-10-06 – 2018-10-09 (×2): 4 [IU] via SUBCUTANEOUS

## 2018-10-04 MED ORDER — ACETAMINOPHEN 650 MG RE SUPP: 650.0000 mg | Freq: Four times a day (QID) | RECTAL | Status: DC | PRN

## 2018-10-04 MED ORDER — ACETAMINOPHEN 325 MG PO TABS
650.0000 mg | ORAL_TABLET | Freq: Four times a day (QID) | ORAL | Status: DC | PRN
  Administered 2018-10-05 – 2018-10-08 (×2): 650 mg via ORAL
  Filled 2018-10-04 (×4): qty 2

## 2018-10-04 MED ORDER — SODIUM CHLORIDE 0.9 % IV SOLN
2.0000 g | INTRAVENOUS | Status: AC
  Administered 2018-10-05 – 2018-10-10 (×6): 2 g via INTRAVENOUS
  Filled 2018-10-04 (×8): qty 20

## 2018-10-04 MED ORDER — METRONIDAZOLE IN NACL 5-0.79 MG/ML-% IV SOLN
500.0000 mg | Freq: Three times a day (TID) | INTRAVENOUS | Status: DC
  Administered 2018-10-04 – 2018-10-07 (×10): 500 mg via INTRAVENOUS
  Filled 2018-10-04 (×11): qty 100

## 2018-10-04 MED ORDER — INSULIN GLARGINE 100 UNIT/ML ~~LOC~~ SOLN
8.0000 [IU] | Freq: Every day | SUBCUTANEOUS | Status: DC
  Administered 2018-10-04: 8 [IU] via SUBCUTANEOUS
  Filled 2018-10-04 (×3): qty 0.08

## 2018-10-04 MED ORDER — ONDANSETRON HCL 4 MG/2ML IJ SOLN: 4.0000 mg | Freq: Four times a day (QID) | INTRAMUSCULAR | Status: DC | PRN

## 2018-10-04 NOTE — H&P (Signed)
History and Physical    Jon House LEX:[id number] DOB: 16-Jun-1970 DOA: 10/03/2018  PCP: Antony Blackbird, MD   Patient coming from: Home, by way of urgent care   Chief Complaint: Left foot wound worsening despite antibiotic   HPI: Jon House is a 48 y.o. 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  Patient believes that he stepped on a nail that pierced the plantar aspect of his left foot on 09/30/2018, and and then developed some pain, redness, and swelling.  He was seen at an outpatient clinic on 10/02/2018, was started on Keflex and had his Tdap updated, but the pain, erythema, and edema has continued to worsen and he has now developed fevers.  There has been thin yellow drainage. He went to an urgent care tonight, was noted to be slightly tachycardic and febrile, and was directed to the ED for further evaluation of this.  He denied cough, shortness of breath, or sick contacts.  ED Course: Upon arrival to the ED, patient is found to be febrile to 38.2 C, saturating well on room air, and with other vitals normal.  Chemistry panel is notable for glucose of 179 and CBC features a leukocytosis to 12,400.  Lactic acid is reassuringly normal.  Radiographs of the left foot are negative for acute bony abnormality.  Patient was treated with IV ciprofloxacin in the ED.  COVID-19 screening test is pending.  Patient will be observed for ongoing evaluation and management of foot infection in a poorly controlled diabetic with history of osteomyelitis.  Review of Systems:  All other systems reviewed and apart from HPI, are negative.  Past Medical History:  Diagnosis Date  . Diabetes mellitus without complication [facility])      Past Surgical History:  Procedure Laterality Date  . AMPUTATION Left 07/31/2018   Procedure: LEFT GREAT TOE AMPUTATION;  Surgeon: Newt Minion, MD;  Location: [place];  Service: Orthopedics;  Laterality: Left;     reports that he has quit smoking. He has never used smokeless tobacco. He reports current alcohol use. He reports that he does not use drugs.  Allergies  Allergen Reactions  . Bee Venom Hives    Family History  Problem Relation Age of Onset  . Diabetes Mother      Prior to Admission medications   Medication Sig Start Date End Date Taking? Authorizing Provider  Blood Glucose Monitoring Suppl (TRUE METRIX METER) w/Device KIT Use to check blood sugars up to  3 times daily 08/05/18   Fulp, Cammie, MD  cephALEXin (KEFLEX) 500 MG capsule Take 1 capsule (500 mg total) by mouth 4 (four) times daily for 7 days. 10/02/18 10/09/18  Gildardo Pounds, NP  glucose blood (TRUE METRIX BLOOD GLUCOSE TEST) test strip Use as instructed to check blood sugars up to 3 x daily 10/02/18   Gildardo Pounds, NP  insulin aspart protamine- aspart (NOVOLOG MIX 70/30) (70-30) 100 UNIT/ML injection Inject 0.1 mLs (10 Units total) into the skin 2 (two) times daily with a meal. 08/05/18   Fulp, Cammie, MD  Insulin Glargine (BASAGLAR KWIKPEN) 100 UNIT/ML SOPN Inject 0.15 mLs (15 Units total) into the skin at bedtime for 30 days. 10/02/18 11/01/18  Gildardo Pounds, NP  Insulin Pen Needle (B-D UF III MINI PEN  NEEDLES) 31G X 5 MM MISC Use as instructed. Inject into the skin once nightly. 10/02/18   Gildardo Pounds, NP  metFORMIN (GLUCOPHAGE) 1000 MG tablet Take 1 tablet (1,000 mg total) by mouth 2 (two) times daily with a meal. 08/05/18 11/03/18  Fulp, Cammie, MD  oxyCODONE (OXY IR/ROXICODONE) 5 MG immediate release tablet Take 1 tablet (5 mg total) by mouth every 6 (six) hours as needed for moderate pain. Patient not taking: Reported on 10/02/2018 08/05/18   Antony Blackbird, MD  TRUEplus Lancets 28G MISC Use to help check blood sugars  up to 3 times per day 08/05/18   Antony Blackbird, MD    Physical Exam: Vitals:   10/03/18 1908 10/04/18 0011 10/04/18 0012  BP: (!) 102/58  125/70  Pulse: 95  93  Resp: 19  17  Temp: (!) 100.7 F (38.2 C) 99.5 F (37.5 C) 99.5 F (37.5 C)  TempSrc: Oral Oral Oral  SpO2: 96%  100%    Constitutional: NAD, calm  Eyes: PERTLA, lids and conjunctivae normal ENMT: Mucous membranes are moist. Posterior pharynx clear of any exudate or lesions.   Neck: normal, supple, no masses, no thyromegaly Respiratory: no wheezing, no crackles. Normal respiratory effort. No accessory muscle use.  Cardiovascular: S1 & S2 heard, regular rate and rhythm. No extremity edema.   Abdomen: No distension, no tenderness, soft. Bowel sounds normal.  Musculoskeletal: no clubbing / cyanosis. Wound at plantar aspect left forefoot with surrounding edema, slight erythema, warmth, and serosanguinous drainage.    Skin: no significant rashes, lesions, ulcers. Warm, dry, well-perfused. Neurologic: CN 2-12 grossly intact. Sensation intact. Strength 5/5 in all 4 limbs.  Psychiatric: Alert and oriented x 3. Calm, cooperative.    Labs on Admission: I have personally reviewed following labs and imaging studies  CBC: Recent Labs  Lab 10/02/18 1016 10/03/18 1912  WBC 10.9* 12.4*  NEUTROABS  --  11.1*  HGB 14.1 13.1  HCT 41.3 38.3*  MCV 88 88.0  PLT 200 720   Basic Metabolic Panel: Recent Labs  Lab 10/03/18 1912  NA 137  K 3.5  CL 98  CO2 31  GLUCOSE 179*  BUN 17  CREATININE 0.87  CALCIUM 9.0   GFR: Estimated Creatinine Clearance: 109.4 mL/min (by C-G formula based on SCr of 0.87 mg/dL). Liver Function Tests: Recent Labs  Lab 10/03/18 1912  AST 15  ALT 15  ALKPHOS 97  BILITOT 0.8  PROT 7.6  ALBUMIN 3.9   No results for input(s): LIPASE, AMYLASE in the last 168 hours. No results for input(s): AMMONIA in the last 168 hours. Coagulation Profile: No results for input(s): INR, PROTIME in the last 168  hours. Cardiac Enzymes: No results for input(s): CKTOTAL, CKMB, CKMBINDEX, TROPONINI in the last 168 hours. BNP (last 3 results) No results for input(s): PROBNP in the last 8760 hours. HbA1C: Recent Labs    10/02/18 0911  HGBA1C 9.6*   CBG: No results for input(s): GLUCAP in the last 168 hours. Lipid Profile: Recent Labs    10/02/18 1016  CHOL 168  HDL 42  LDLCALC 108*  TRIG 89  CHOLHDL 4.0   Thyroid Function Tests: No results for input(s): TSH, T4TOTAL, FREET4, T3FREE, THYROIDAB in the last 72 hours. Anemia Panel: No results for input(s): VITAMINB12, FOLATE, FERRITIN, TIBC, IRON, RETICCTPCT in the last 72 hours. Urine analysis:    Component Value Date/Time   COLORURINE YELLOW 07/29/2018 1159   APPEARANCEUR CLEAR 07/29/2018 1159   LABSPEC 1.031 (H) 07/29/2018 1159  PHURINE 6.0 07/29/2018 1159   GLUCOSEU >=500 (A) 07/29/2018 1159   HGBUR NEGATIVE 07/29/2018 1159   BILIRUBINUR NEGATIVE 07/29/2018 1159   KETONESUR NEGATIVE 07/29/2018 1159   PROTEINUR NEGATIVE 07/29/2018 1159   NITRITE NEGATIVE 07/29/2018 1159   LEUKOCYTESUR NEGATIVE 07/29/2018 1159   Sepsis Labs: _0 (procalcitonin:4,lacticidven:4) )No results found for this or any previous visit (from the past 240 hour(s)).   Radiological Exams on Admission: Dg Foot Complete Left  Result Date: 10/03/2018 CLINICAL DATA:  Right foot pain after stepping on nail EXAM: LEFT FOOT - COMPLETE 3+ VIEW COMPARISON:  None. FINDINGS: There is no evidence of fracture or dislocation. Status post left great toe amputation. No osteolysis. There is no evidence of arthropathy or other focal bone abnormality. Soft tissues are unremarkable. IMPRESSION: Negative. Electronically Signed   By: Ulyses Jarred M.D.   On: 10/03/2018 20:19    EKG: Not performed.   Assessment/Plan   1. Left foot infection  - Patient has poorly-controlled DM, had left 1st toe osteo s/p amputation in March, and now p/w progressive swelling, pain, and  redness around a small wound at the plantar aspect of left forefoot - He is found to be febrile, slightly tachycardic, with leukocytosis, and normal lactate  - Plain radiographs negative for bone involvement  - He had ABI in March with no evidence for significant arterial disease  - Culture blood, start vancomycin, Rocephin, and Flagyl, check/trend inflammatory markers   2. Poorly-controlled IDDM  - A1c was 9.6% in June 2020  - Continue Lantus with Novolog correctional     PPE: Mask, face shield  DVT prophylaxis: SCD's  Code Status: Full  Family Communication: Discussed with patient Consults called: None Admission status: Observation     Vianne Bulls, MD [facility] Pager [phone]  If 7PM-7AM, please contact night-coverage [email or web address] Password [email or web address]  10/04/2018, 1:32 AM

## 2018-10-04 NOTE — Plan of Care (Signed)
?  Problem: Clinical Measurements: ?Goal: Will remain free from infection ?Outcome: Progressing ?  ?Problem: Skin Integrity: ?Goal: Risk for impaired skin integrity will decrease ?Outcome: Progressing ?  ?Problem: Safety: ?Goal: Ability to remain free from injury will improve ?Outcome: Progressing ?  ?

## 2018-10-04 NOTE — Progress Notes (Signed)
Jon House is a 48 y.o. 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   He was admitted for left foot wound and started on broad spectrum antibiotics.  Dr Lajoyce Corners requested to see the patient to see if he needs I&D Please see detailed H&H from 6/5 from Dr Antionette Char.   Continue to monitor.    Kathlen Mody, MD [phone]

## 2018-10-04 NOTE — Progress Notes (Signed)
Inpatient Diabetes Program Recommendations  AACE/ADA: New Consensus Statement on Inpatient Glycemic Control (2015)  Target Ranges:  Prepandial:   less than 140 mg/dL      Peak postprandial:   less than 180 mg/dL (1-2 hours)      Critically ill patients:  140 - 180 mg/dL   Lab Results  Component Value Date   GLUCAP 210 (H) 10/04/2018   HGBA1C 9.6 (A) 10/02/2018     Results for LOTTIE, LINGREN (MRN [id number]) as of 10/04/2018 07:58  Ref. Range 10/04/2018 08:20  Glucose-Capillary Latest Ref Range: 70 - 99 mg/dL 590 (H)  Novolog 3 units       Review of Glycemic Control  Diabetes history:DM2  Outpatient Diabetes medications: Glucophage 1000 mg BID                                                           Novolog 70/30 10 units BID                                                          Basaglar 15 units Q HS (ordered by PCP on 6/3)  Current orders for Inpatient glycemic control: Novolog (0-9 units) TID ac                                                                          Lantus 8 units QHS   Diabetes consult noted. I spoke to patient (via interpreter) on 3/31 admission regarding Hgb A1c of 11.1%. Had reviewed Spanish version of "living well with diabetes" with patient at that time. He was referred to PCP at [facility] (went to appt). Hgb A1c has improved and is now 9.6% (had previously discussed with patient goal of 7% or less).    MD please consider following inpatient insulin adjustment:  1. Increase Lantus to 15 units QHS  2. Increase Novolog correction to moderate (0-15 units) tid ac and (0-5 units) hs   -- Will follow during hospitalization.--  Jamelle Rushing RN, MSN Diabetes Coordinator Inpatient Glycemic Control Team Team Pager: [phone] (8am-5pm)

## 2018-10-04 NOTE — Care Management (Signed)
Case manager will continue to monitor patient for appropriate  discharge plan and needs.    Vance Peper, RN BSN Case Manager [phone]

## 2018-10-04 NOTE — Progress Notes (Signed)
Pt arrived from the ED via stretcher. Pt is AOX4, ESL , Spanish primary language. Pt with personal cane at the bedside. Ambulates with a steady gait. L foot with Great toe amputated, not this admit. Noted L foot plantar region with open small circular wound. Scant amount serous drainage and foul odor. Cleansed area and applied foam dressing.   Pt oriented to room environment, call light and phone in reach. Pt resides in private residence with wife. Rounds maintained per unit protocol and MD orders.

## 2018-10-04 NOTE — ED Notes (Signed)
Admitting provider bedside 

## 2018-10-04 NOTE — Consult Note (Signed)
WOC Nurse wound consult note Reason for Consult: Consult requested for left foot wound Wound type: Full thickness wound to left plantar area Measurement: 1X.3X.8cm Wound bed: red and moist Drainage (amount, consistency, odor) mod amt tan drainage, no odor Periwound: intact skin surrounding Dressing procedure/placement/frequency:  X-ray did not reveal osteomyelitis; consider MRI for more definitive diagnostic test if desired since bone is palpable with a swab.   Please re-consult if further assistance is needed.  Thank-you,  Cammie Mcgee MSN, RN, CWOCN, [place], CNS [phone]

## 2018-10-04 NOTE — Progress Notes (Signed)
Pharmacy Antibiotic Note  Natanel Angotti is a 48 y.o. male admitted on 10/03/2018 with diabetic foot wound.  Pharmacy has been consulted for Vancomycin dosing. WBC 12.4. Renal function good.   Plan: Vancomycin 1250 mg IV q12h >>Esimated AUC: 468 Ceftriaxone/Flagyl per MD Trend WBC, temp, renal function  F/U infectious work-up Drug levels as indicated  Temp (24hrs), Avg:99.8 F (37.7 C), Min:99.3 F (37.4 C), Max:100.7 F (38.2 C)  Recent Labs  Lab 10/02/18 1016 10/03/18 1912  WBC 10.9* 12.4*  CREATININE  --  0.87  LATICACIDVEN  --  1.2    Estimated Creatinine Clearance: 109.4 mL/min (by C-G formula based on SCr of 0.87 mg/dL).    Allergies  Allergen Reactions  . Bee Venom Hives     Abran Duke 10/04/2018 3:57 AM

## 2018-10-04 NOTE — Consult Note (Signed)
ORTHOPAEDIC CONSULTATION  REQUESTING PHYSICIAN: Hosie Poisson, MD  Chief Complaint: Purulent abscess left foot.  HPI: Jon House is a 48 y.o. male who presents with a new purulent abscess left foot.  Patient is status post left great toe amputation approximately 2 months ago.  Patient has developed a new abscess with purulent drainage beneath the third metatarsal head.  Patient has uncontrolled type 2 diabetes with a hemoglobin A1c 9.6 and severe protein caloric malnutrition with a prealbumin of 9.6  Past Medical History:  Diagnosis Date  . Diabetes mellitus without complication ([place])   . Infection 10/04/2018   LEFT FOOT   Past Surgical History:  Procedure Laterality Date  . AMPUTATION Left 07/31/2018   Procedure: LEFT GREAT TOE AMPUTATION;  Surgeon: Newt Minion, MD;  Location: [place];  Service: Orthopedics;  Laterality: Left;   Social History   Socioeconomic History  . Marital status: Significant Other    Spouse name: Not on file  . Number of children: Not on file  . Years of education: Not on file  . Highest education level: Not on file  Occupational History  . Not on file  Social Needs  . Financial resource strain: Not on file  . Food insecurity:    Worry: Not on file    Inability: Not on file  . Transportation needs:    Medical: Not on file    Non-medical: Not on file  Tobacco Use  . Smoking status: Former Research scientist (life sciences)  . Smokeless tobacco: Never Used  Substance and Sexual Activity  . Alcohol use: Yes    Comment: sometimes  . Drug use: Never  . Sexual activity: Not on file  Lifestyle  . Physical activity:    Days per week: Not on file    Minutes per session: Not on file  . Stress: Not on file  Relationships  . Social connections:    Talks on phone: Not on file    Gets together: Not on file    Attends religious service: Not on file    Active member of club or organization: Not on file    Attends meetings of clubs or organizations: Not on file   Relationship status: Not on file  Other Topics Concern  . Not on file  Social History Narrative  . Not on file   Family History  Problem Relation Age of Onset  . Diabetes Mother    - negative except otherwise stated in the family history section Allergies  Allergen Reactions  . Bee Venom Hives   Prior to Admission medications   Medication Sig Start Date End Date Taking? Authorizing Provider  cephALEXin (KEFLEX) 500 MG capsule Take 1 capsule (500 mg total) by mouth 4 (four) times daily for 7 days. 10/02/18 10/09/18 Yes Gildardo Pounds, NP  insulin aspart protamine- aspart (NOVOLOG MIX 70/30) (70-30) 100 UNIT/ML injection Inject 0.1 mLs (10 Units total) into the skin 2 (two) times daily with a meal. 08/05/18  Yes Fulp, Cammie, MD  Insulin Glargine (BASAGLAR KWIKPEN) 100 UNIT/ML SOPN Inject 0.15 mLs (15 Units total) into the skin at bedtime for 30 days. 10/02/18 11/01/18 Yes Gildardo Pounds, NP  metFORMIN (GLUCOPHAGE) 1000 MG tablet Take 1 tablet (1,000 mg total) by mouth 2 (two) times daily with a meal. Patient taking differently: Take 800 mg by mouth 2 (two) times daily with a meal.  08/05/18 11/03/18 Yes Fulp, Cammie, MD  oxyCODONE (OXY IR/ROXICODONE) 5 MG immediate release tablet Take 1 tablet (5  mg total) by mouth every 6 (six) hours as needed for moderate pain. 08/05/18  Yes Fulp, Cammie, MD  Blood Glucose Monitoring Suppl (TRUE METRIX METER) w/Device KIT Use to check blood sugars up to  3 times daily 08/05/18   Fulp, Cammie, MD  glucose blood (TRUE METRIX BLOOD GLUCOSE TEST) test strip Use as instructed to check blood sugars up to 3 x daily 10/02/18   Gildardo Pounds, NP  Insulin Pen Needle (B-D UF III MINI PEN NEEDLES) 31G X 5 MM MISC Use as instructed. Inject into the skin once nightly. 10/02/18   Gildardo Pounds, NP  TRUEplus Lancets 28G MISC Use to help check blood sugars up to 3 times per day 08/05/18   Antony Blackbird, MD   Dg Foot Complete Left  Result Date: 10/03/2018 CLINICAL DATA:  Right foot  pain after stepping on nail EXAM: LEFT FOOT - COMPLETE 3+ VIEW COMPARISON:  None. FINDINGS: There is no evidence of fracture or dislocation. Status post left great toe amputation. No osteolysis. There is no evidence of arthropathy or other focal bone abnormality. Soft tissues are unremarkable. IMPRESSION: Negative. Electronically Signed   By: Ulyses Jarred M.D.   On: 10/03/2018 20:19   - pertinent xrays, CT, MRI studies were reviewed and independently interpreted  Positive ROS: All other systems have been reviewed and were otherwise negative with the exception of those mentioned in the HPI and as above.  Physical Exam: General: Alert, no acute distress Psychiatric: Patient is competent for consent with normal mood and affect Lymphatic: No axillary or cervical lymphadenopathy Cardiovascular: No pedal edema Respiratory: No cyanosis, no use of accessory musculature GI: No organomegaly, abdomen is soft and non-tender    Images:  _0 @  Labs:  Lab Results  Component Value Date   HGBA1C 9.6 (A) 10/02/2018   HGBA1C 11.1 (H) 07/29/2018   ESRSEDRATE 54 (H) 10/04/2018   CRP 19.7 (H) 10/04/2018    Lab Results  Component Value Date   ALBUMIN 3.9 10/03/2018   ALBUMIN 4.7 08/05/2018   ALBUMIN 3.9 07/29/2018   PREALBUMIN 9.6 (L) 10/04/2018    Neurologic: Patient does not have protective sensation bilateral lower extremities.   MUSCULOSKELETAL:   Skin: Examination patient's foot is warm and cellulitic.  He has a good dorsalis pedis and posterior tibial pulse.  There is purulent drainage from the ulcer beneath the third metatarsal head.  This probes down to bone.  Plain radiograph is unremarkable for any destructive bony changes.  Assessment: Assessment: Diabetic insensate neuropathy with severe protein caloric malnutrition with a new purulent abscess left midfoot status post great toe amputation.  Plan: Plan: I will order an MRI scan to determine the extent of the abscess.   Patient most likely will require a transmetatarsal amputation.  Will continue antibiotics and make surgical decision based on MRI scan findings.  The ulcer is open and draining and do not feel that this is emergent for surgical intervention.  Thank you for the consult and the opportunity to see Jon House, [place] [phone] 5:15 PM

## 2018-10-04 NOTE — ED Provider Notes (Signed)
[place] EMERGENCY DEPARTMENT Provider Note   CSN: [id number] Arrival date & time: 10/03/18  1811    History   Chief Complaint Chief Complaint  Patient presents with  . Foot Pain    [name] is a 48 y.o. male.     Patient presents to the emergency department with a chief complaint of left foot injury.  He states that he stepped on a nail 3 days ago.  [facility]eflex.  Patient reports having increased swelling, redness, and now fever.  He is a diabetic.  He was seen in urgent care today, and was advised to come to the emergency department.  He has a history of osteomyelitis of his left great toe, which was amputated.  He denies any cough or shortness of breath.  Denies any other associated symptoms.  The history is provided by the patient. No language interpreter was used.    Past Medical History:  Diagnosis Date  . Diabetes mellitus without complication [facility])     Patient Active Problem List   Diagnosis Date Noted  . Osteomyelitis of great toe of left foot ([place]) 07/29/2018  . Diabetic polyneuropathy associated with type 2 diabetes mellitus [facility])     Past Surgical History:  Procedure Laterality Date  . AMPUTATION Left 07/31/2018   Procedure: LEFT GREAT TOE AMPUTATION;  Surgeon: Newt Minion, MD;  Location: [place];  Service: Orthopedics;  Laterality: Left;        Home Medications    Prior to Admission medications   Medication Sig Start Date End Date Taking? Authorizing Provider  Blood Glucose Monitoring Suppl (TRUE METRIX METER) w/Device KIT Use to check blood sugars up to  3 times daily 08/05/18   Fulp, Cammie, MD  cephALEXin (KEFLEX) 500 MG capsule Take 1 capsule (500 mg total) by mouth 4 (four) times daily for 7 days. 10/02/18 10/09/18  Gildardo Pounds, NP  glucose blood (TRUE METRIX BLOOD GLUCOSE TEST) test strip Use as instructed to check blood sugars up to 3 x daily  10/02/18   Gildardo Pounds, NP  insulin aspart protamine- aspart (NOVOLOG MIX 70/30) (70-30) 100 UNIT/ML injection Inject 0.1 mLs (10 Units total) into the skin 2 (two) times daily with a meal. 08/05/18   Fulp, Cammie, MD  Insulin Glargine (BASAGLAR KWIKPEN) 100 UNIT/ML SOPN Inject 0.15 mLs (15 Units total) into the skin at bedtime for 30 days. 10/02/18 11/01/18  Gildardo Pounds, NP  Insulin Pen Needle (B-D UF III MINI PEN NEEDLES) 31G X 5 MM MISC Use as instructed. Inject into the skin once nightly. 10/02/18   Gildardo Pounds, NP  metFORMIN (GLUCOPHAGE) 1000 MG tablet Take 1 tablet (1,000 mg total) by mouth 2 (two) times daily with a meal. 08/05/18 11/03/18  Fulp, Cammie, MD  oxyCODONE (OXY IR/ROXICODONE) 5 MG immediate release tablet Take 1 tablet (5 mg total) by mouth every 6 (six) hours as needed for moderate pain. Patient not taking: Reported on 10/02/2018 08/05/18   Antony Blackbird, MD  TRUEplus Lancets 28G MISC Use to help check blood sugars up to 3 times per day 08/05/18   Antony Blackbird, MD    Family History Family History  Problem Relation Age of Onset  . Diabetes Mother     Social History Social History   Tobacco Use  . Smoking status: Former Research scientist (life sciences)  . Smokeless tobacco: Never Used  Substance Use Topics  .  Alcohol use: Yes    Comment: sometimes  . Drug use: Never     Allergies   Bee venom   Review of Systems Review of Systems  All other systems reviewed and are negative.    Physical Exam Updated Vital Signs BP 125/70 (BP Location: Right Arm)   Pulse 93   Temp 99.5 F (37.5 C) (Oral)   Resp 17   SpO2 100%   Physical Exam Vitals signs and nursing note reviewed.  Constitutional:      Appearance: He is well-developed.  HENT:     Head: Normocephalic and atraumatic.  Eyes:     Conjunctiva/sclera: Conjunctivae normal.  Neck:     Musculoskeletal: Neck supple.  Cardiovascular:     Rate and Rhythm: Normal rate and regular rhythm.     Heart sounds: No murmur.  Pulmonary:      Effort: Pulmonary effort is normal. No respiratory distress.     Breath sounds: Normal breath sounds.  Abdominal:     Palpations: Abdomen is soft.     Tenderness: There is no abdominal tenderness.  Musculoskeletal: Normal range of motion.        General: Signs of injury present. No tenderness.     Comments: Erythema on the dorsum of the left foot, there is a puncture wound to the plantar surface, without discharge or drainage  Skin:    General: Skin is warm and dry.     Findings: Erythema present.  Neurological:     Mental Status: He is alert.  Psychiatric:        Mood and Affect: Mood normal.        Behavior: Behavior normal.        Thought Content: Thought content normal.        Judgment: Judgment normal.          ED Treatments / Results  Labs (all labs ordered are listed, but only abnormal results are displayed) Labs Reviewed  COMPREHENSIVE METABOLIC PANEL - Abnormal; Notable for the following components:      Result Value   Glucose, Bld 179 (*)    All other components within normal limits  CBC WITH DIFFERENTIAL/PLATELET - Abnormal; Notable for the following components:   WBC 12.4 (*)    HCT 38.3 (*)    Neutro Abs 11.1 (*)    Lymphs Abs 0.5 (*)    All other components within normal limits  LACTIC ACID, PLASMA  URINALYSIS, ROUTINE W REFLEX MICROSCOPIC    EKG None  Radiology Dg Foot Complete Left  Result Date: 10/03/2018 CLINICAL DATA:  Right foot pain after stepping on nail EXAM: LEFT FOOT - COMPLETE 3+ VIEW COMPARISON:  None. FINDINGS: There is no evidence of fracture or dislocation. Status post left great toe amputation. No osteolysis. There is no evidence of arthropathy or other focal bone abnormality. Soft tissues are unremarkable. IMPRESSION: Negative. Electronically Signed   By: Ulyses Jarred M.D.   On: 10/03/2018 20:19    Procedures Procedures (including critical care time)  Medications Ordered in ED Medications - No data to display   Initial  Impression / Assessment and Plan / ED Course  I have reviewed the triage vital signs and the nursing notes.  Pertinent labs & imaging results that were available during my care of the patient were reviewed by me and considered in my medical decision making (see chart for details).        Patient with puncture wound to left foot.  He stepped on a  nail 3 days ago.  Has been taking Keflex, but reports no improvement of symptoms.  Has had worsening redness, swelling, and fever.  Is a diabetic.  Patient may need IV antibiotic therapy for failing outpatient treatment, versus broadening antibiotic therapy to include Pseudomonas given that the nail penetrated his shoe and foot.  Tdap was updated at first visit.  Appreciate Dr. Myna Hidalgo for bringing patient in for observation.  Final Clinical Impressions(s) / ED Diagnoses   Final diagnoses:  Diabetic infection of left foot [facility])    ED Discharge Orders    None       Montine Circle, PA-C 10/04/18 0123    Ripley Fraise, MD 10/04/18 0130

## 2018-10-04 NOTE — Progress Notes (Signed)
Initial Nutrition Assessment   RD working remotely.  DOCUMENTATION CODES:   Not applicable  INTERVENTION:  Provide Glucerna Shake po BID, each supplement provides 220 kcal and 10 grams of protein.  Provide Juven BID, each packet provides 95 calories, 2.5 grams of protein (collagen), and 9.8 grams of carbohydrate (3 grams sugar); also contains 7 grams of L-arginine and L-glutamine, 300 mg vitamin C, 15 mg vitamin E, 1.2 mcg vitamin B-12, 9.5 mg zinc, 200 mg calcium, and 1.5 g  Calcium Beta-hydroxy-Beta-methylbutyrate to support wound healing  Encourage adequate PO intake.   NUTRITION DIAGNOSIS:   Increased nutrient needs related to wound healing as evidenced by estimated needs.  GOAL:   Patient will meet greater than or equal to 90% of their needs  MONITOR:   PO intake, Supplement acceptance, Labs, Weight trends, I & O's, Skin  REASON FOR ASSESSMENT:   Consult Wound healing  ASSESSMENT:   48 y.o. 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 X-ray did not reveal osteomyelitis.   Meal completion 50%. Pt with no weight loss per weight records. RD to order nutritional supplements to aid in caloric and protein needs as well as in wound healing.  Unable to complete Nutrition-Focused physical exam at this time.   Labs and medications reviewed.   Diet Order:   Diet Order            Diet heart healthy/carb modified Room service appropriate? Yes; Fluid consistency: Thin  Diet effective now              EDUCATION NEEDS:   Not appropriate for education at this time  Skin:  Skin Assessment: Skin Integrity Issues: Skin Integrity Issues:: Diabetic Ulcer Diabetic Ulcer: L foot  Last BM:  6/4  Height:    Ht Readings from Last 1 Encounters:  10/04/18 5\' 11"  (1.803 m)    Weight:   Wt Readings from Last 1 Encounters:  10/04/18 77.6 kg    Ideal Body Weight:  78.18 kg  BMI:  Body mass index is 23.86 kg/m.  Estimated Nutritional Needs:   Kcal:  2100-2300  Protein:  105-115 grams  Fluid:  2.1 - 2.3 L/day    Roslyn Smiling, MS, RD, LDN Pager # [phone] After hours/ weekend pager # [phone]

## 2018-10-04 NOTE — ED Notes (Signed)
ED TO INPATIENT HANDOFF REPORT  ED Nurse Name and Phone #: Ben [phone]  S Name/Age/Gender Jon House 48 y.o. male Room/Bed: 020C/020C  Code Status   Code Status: Prior  Home/SNF/Other Home Patient oriented to: self, place, time and situation Is this baseline? Yes   Triage Complete: Triage complete  Chief Complaint Foot infection,Fever   Triage Note Pt reports L foot infection.  He [facility] the [facility] and was instructed to come to the ED.  He also has a fever.  He has hx of DM.     Allergies Allergies  Allergen Reactions  . Bee Venom Hives    Level of Care/Admitting Diagnosis ED Disposition    ED Disposition Condition Comment   Admit  Hospital Area: [facility] [[id number]]  Level of Care: Med-Surg [16]  I expect the patient will be discharged within 24 hours: Yes  LOW acuity---Tx typically complete <24 hrs---ACUTE conditions typically can be evaluated <24 hours---LABS likely to return to acceptable levels <24 hours---IS near functional baseline---EXPECTED to return to current living arrangement---NOT newly hypoxic: Does not meet criteria for 5C-Observation unit  Covid Evaluation: Screening Protocol (No Symptoms)  Diagnosis: Diabetic infection of left foot [facility]) [800349]  Admitting Physician: Briscoe Deutscher [[id number]]  Attending Physician: Briscoe Deutscher [[id number]]  PT Class (Do Not Modify): Observation [104]  PT Acc Code (Do Not Modify): Observation [10022]       B Medical/Surgery History Past Medical History:  Diagnosis Date  . Diabetes mellitus without complication [facility])    Past Surgical History:  Procedure Laterality Date  . AMPUTATION Left 07/31/2018   Procedure: LEFT GREAT TOE AMPUTATION;  Surgeon: [name], MD;  Location: [facility] OR;  Service: Orthopedics;  Laterality: Left;     A IV Location/Drains/Wounds Patient Lines/Drains/Airways Status   Active Line/Drains/Airways    Name:   Placement date:   Placement time:   Site:   Days:   Peripheral IV 10/04/18 Right Forearm   10/04/18    0115    Forearm   less than 1   Incision (Closed) 07/31/18 Leg Left   07/31/18    1057     65          Intake/Output Last 24 hours No intake or output data in the 24 hours ending 10/04/18 0126  Labs/Imaging Results for orders placed or performed during the hospital encounter of 10/03/18 (from the past 48 hour(s))  Lactic acid, plasma     Status: None   Collection Time: 10/03/18  7:12 PM  Result Value Ref Range   Lactic Acid, Venous 1.2 0.5 - 1.9 mmol/L    Comment: Performed at [facility], [address], [place], [place] [address]  Comprehensive metabolic panel     Status: Abnormal   Collection Time: 10/03/18  7:12 PM  Result Value Ref Range   Sodium 137 135 - 145 mmol/L   Potassium 3.5 3.5 - 5.1 mmol/L   Chloride 98 98 - 111 mmol/L   CO2 31 22 - 32 mmol/L   Glucose, Bld 179 (H) 70 - 99 mg/dL   BUN 17 6 - 20 mg/dL   Creatinine, Ser 7.48 0.61 - 1.24 mg/dL   Calcium 9.0 8.9 - 27.0 mg/dL   Total Protein 7.6 6.5 - 8.1 g/dL   Albumin 3.9 3.5 - 5.0 g/dL   AST 15 15 - 41 U/L   ALT 15 0 - 44 U/L   Alkaline Phosphatase 97 38 - 126 U/L  Total Bilirubin 0.8 0.3 - 1.2 mg/dL   GFR calc non Af Amer >60 >60 mL/min   GFR calc Af Amer >60 >60 mL/min   Anion gap 8 5 - 15    Comment: Performed at [facility], [address] [place], [place] [address]  CBC with Differential     Status: Abnormal   Collection Time: 10/03/18  7:12 PM  Result Value Ref Range   WBC 12.4 (H) 4.0 - 10.5 K/uL   RBC 4.35 4.22 - 5.81 MIL/uL   Hemoglobin 13.1 13.0 - 17.0 g/dL   HCT 60.438.3 (L) 54.039.0 - 98.152.0 %   MCV 88.0 80.0 - 100.0 fL   MCH 30.1 26.0 - 34.0 pg   MCHC 34.2 30.0 - 36.0 g/dL   RDW 19.112.2 47.811.5 - 29.515.5 %   Platelets 194 150 - 400 K/uL   nRBC 0.0 0.0 - 0.2 %   Neutrophils Relative % 89 %   Neutro Abs 11.1 (H) 1.7 - 7.7 K/uL   Lymphocytes Relative 4 %   Lymphs Abs 0.5 (L) 0.7 - 4.0 K/uL   Monocytes Relative 6 %   Monocytes Absolute 0.7  0.1 - 1.0 K/uL   Eosinophils Relative 1 %   Eosinophils Absolute 0.1 0.0 - 0.5 K/uL   Basophils Relative 0 %   Basophils Absolute 0.0 0.0 - 0.1 K/uL   Immature Granulocytes 0 %   Abs Immature Granulocytes 0.05 0.00 - 0.07 K/uL    Comment: Performed at [facility], [address] [place], [place] [address]   Dg Foot Complete Left  Result Date: 10/03/2018 CLINICAL DATA:  Right foot pain after stepping on nail EXAM: LEFT FOOT - COMPLETE 3+ VIEW COMPARISON:  None. FINDINGS: There is no evidence of fracture or dislocation. Status post left great toe amputation. No osteolysis. There is no evidence of arthropathy or other focal bone abnormality. Soft tissues are unremarkable. IMPRESSION: Negative. Electronically Signed   By: Deatra RobinsonKevin  Herman M.D.   On: 10/03/2018 20:19    Pending Labs Unresulted Labs (From admission, onward)    Start     Ordered   10/04/18 0105  SARS Coronavirus 2 (CEPHEID - Performed in [facility]), Hosp Order  (Asymptomatic Patients Labs)  Once,   R    Question:  Rule Out  Answer:  Yes   10/04/18 0104   10/03/18 1912  Urinalysis, Routine w reflex microscopic  ONCE - STAT,   STAT     10/03/18 1911          Vitals/Pain Today's Vitals   10/03/18 1908 10/04/18 0011 10/04/18 0012  BP: (!) 102/58  125/70  Pulse: 95  93  Resp: 19  17  Temp: (!) 100.7 F (38.2 C) 99.5 F (37.5 C) 99.5 F (37.5 C)  TempSrc: Oral Oral Oral  SpO2: 96%  100%  PainSc: 10-Worst pain ever      Isolation Precautions No active isolations  Medications Medications  ciprofloxacin (CIPRO) IVPB 400 mg (400 mg Intravenous New Bag/Given 10/04/18 0120)    Mobility walks with device Low fall risk   Focused Assessments Diabetic Ulcer on L Foot   R Recommendations: See Admitting Provider Note  Report given to:   Additional Notes: N/A

## 2018-10-05 ENCOUNTER — Observation Stay (HOSPITAL_COMMUNITY): Payer: Medicaid Other

## 2018-10-05 DIAGNOSIS — M86272 Subacute osteomyelitis, left ankle and foot: Secondary | ICD-10-CM | POA: Diagnosis present

## 2018-10-05 DIAGNOSIS — E11621 Type 2 diabetes mellitus with foot ulcer: Secondary | ICD-10-CM | POA: Diagnosis present

## 2018-10-05 DIAGNOSIS — Z833 Family history of diabetes mellitus: Secondary | ICD-10-CM | POA: Diagnosis not present

## 2018-10-05 DIAGNOSIS — Z89412 Acquired absence of left great toe: Secondary | ICD-10-CM | POA: Diagnosis not present

## 2018-10-05 DIAGNOSIS — E1169 Type 2 diabetes mellitus with other specified complication: Secondary | ICD-10-CM | POA: Diagnosis present

## 2018-10-05 DIAGNOSIS — E43 Unspecified severe protein-calorie malnutrition: Secondary | ICD-10-CM | POA: Diagnosis present

## 2018-10-05 DIAGNOSIS — T380X5A Adverse effect of glucocorticoids and synthetic analogues, initial encounter: Secondary | ICD-10-CM | POA: Diagnosis present

## 2018-10-05 DIAGNOSIS — Z79899 Other long term (current) drug therapy: Secondary | ICD-10-CM | POA: Diagnosis not present

## 2018-10-05 DIAGNOSIS — E11628 Type 2 diabetes mellitus with other skin complications: Secondary | ICD-10-CM | POA: Diagnosis present

## 2018-10-05 DIAGNOSIS — E1140 Type 2 diabetes mellitus with diabetic neuropathy, unspecified: Secondary | ICD-10-CM | POA: Diagnosis present

## 2018-10-05 DIAGNOSIS — Z794 Long term (current) use of insulin: Secondary | ICD-10-CM | POA: Diagnosis not present

## 2018-10-05 DIAGNOSIS — E876 Hypokalemia: Secondary | ICD-10-CM | POA: Diagnosis present

## 2018-10-05 DIAGNOSIS — D631 Anemia in chronic kidney disease: Secondary | ICD-10-CM | POA: Diagnosis present

## 2018-10-05 DIAGNOSIS — Z79891 Long term (current) use of opiate analgesic: Secondary | ICD-10-CM | POA: Diagnosis not present

## 2018-10-05 DIAGNOSIS — E1165 Type 2 diabetes mellitus with hyperglycemia: Secondary | ICD-10-CM | POA: Diagnosis present

## 2018-10-05 DIAGNOSIS — Z1159 Encounter for screening for other viral diseases: Secondary | ICD-10-CM | POA: Diagnosis not present

## 2018-10-05 DIAGNOSIS — L97509 Non-pressure chronic ulcer of other part of unspecified foot with unspecified severity: Secondary | ICD-10-CM | POA: Diagnosis present

## 2018-10-05 LAB — CBC
HCT: 36.4 % — ABNORMAL LOW (ref 39.0–52.0)
Hemoglobin: 12.7 g/dL — ABNORMAL LOW (ref 13.0–17.0)
MCH: 30.4 pg (ref 26.0–34.0)
MCHC: 34.9 g/dL (ref 30.0–36.0)
MCV: 87.1 fL (ref 80.0–100.0)
Platelets: 201 10*3/uL (ref 150–400)
RBC: 4.18 MIL/uL — ABNORMAL LOW (ref 4.22–5.81)
RDW: 11.9 % (ref 11.5–15.5)
WBC: 6.9 10*3/uL (ref 4.0–10.5)
nRBC: 0 % (ref 0.0–0.2)

## 2018-10-05 LAB — GLUCOSE, CAPILLARY
Glucose-Capillary: 214 mg/dL — ABNORMAL HIGH (ref 70–99)
Glucose-Capillary: 224 mg/dL — ABNORMAL HIGH (ref 70–99)
Glucose-Capillary: 231 mg/dL — ABNORMAL HIGH (ref 70–99)
Glucose-Capillary: 274 mg/dL — ABNORMAL HIGH (ref 70–99)

## 2018-10-05 LAB — BASIC METABOLIC PANEL
Anion gap: 12 (ref 5–15)
BUN: 17 mg/dL (ref 6–20)
CO2: 24 mmol/L (ref 22–32)
Calcium: 8.8 mg/dL — ABNORMAL LOW (ref 8.9–10.3)
Chloride: 98 mmol/L (ref 98–111)
Creatinine, Ser: 0.75 mg/dL (ref 0.61–1.24)
GFR calc Af Amer: >60 mL/min (ref >60)
GFR calc non Af Amer: >60 mL/min (ref >60)
Glucose, Bld: 267 mg/dL — ABNORMAL HIGH (ref 70–99)
Potassium: 4.3 mmol/L (ref 3.5–5.1)
Sodium: 134 mmol/L — ABNORMAL LOW (ref 135–145)

## 2018-10-05 LAB — C-REACTIVE PROTEIN: CRP: 19.7 mg/dL — ABNORMAL HIGH (ref <1.0)

## 2018-10-05 LAB — SEDIMENTATION RATE: Sed Rate: 74 mm/hr — ABNORMAL HIGH (ref 0–16)

## 2018-10-05 MED ORDER — INSULIN GLARGINE 100 UNIT/ML ~~LOC~~ SOLN
15.0000 [IU] | Freq: Every day | SUBCUTANEOUS | Status: DC
  Administered 2018-10-05: 15 [IU] via SUBCUTANEOUS
  Filled 2018-10-05 (×3): qty 0.15

## 2018-10-05 MED ORDER — ENOXAPARIN SODIUM 40 MG/0.4ML ~~LOC~~ SOLN
40.0000 mg | SUBCUTANEOUS | Status: DC
  Administered 2018-10-05 – 2018-10-10 (×6): 40 mg via SUBCUTANEOUS
  Filled 2018-10-05 (×6): qty 0.4

## 2018-10-05 NOTE — Plan of Care (Signed)

## 2018-10-05 NOTE — Progress Notes (Signed)
PROGRESS NOTE    Jon House  XBJ:[id number] DOB: [birth year] DOA: 10/03/2018 PCP: Jon SaupeFulp, Cammie, MD   Brief Narrative:   Jon AppJorge Gomez Renteriais a 48 y.o.malewith medical history significant forpoorly controlled insulin-dependent diabetes mellitus and left great toe osteomyelitis status post amputation in March,now presenting to emergency department for evaluation of worsening swelling, pain, and redness surrounding a wound on the plantar aspect of his left forefoot.   He was admitted for left foot wound and started on broad spectrum antibiotics.  Consulted Dr Jon House and MRI of the left foot ordered.   It shows Soft tissue ulceration at the plantar base of the second MTP joint with extension to the joint. Underlying second MTP joint septic arthritis with osteomyelitis of the entire second metatarsal, as well as the lateral cuneiform. Small third MTP joint effusion, potentially reactive, but septic arthritis is not excluded. Reactive osteitis of the third metatarsal. Pathologic fracture of the second metatarsal head and neck.  Assessment & Plan:   Principal Problem:   Diabetic infection of left foot (HCC) Active Problems:   Uncontrolled insulin dependent diabetes mellitus (HCC)   Osteomyelitis of the entire second metatarsal, lateral cuneiform of the left foot: Currently on broad spectrum IV antibiotics.  Dr Jon House on board.  Pain control. He will probably need amputation.  Pt afebrile and leukocytosis resolved.   Hypokalemia:  Replaced.   Uncontrolled DM with hyperglycemia:  CBG (last 3)  Recent Labs    10/04/18 2109 10/05/18 0815 10/05/18 1142  GLUCAP 262* 214* 224*   Not well controlled.  Increase the lantus to 15 units, home dose, in addition to mod SSI.    Mild anemia of chronic disease:  Hemoglobin stable around 12.    DVT prophylaxis: lovenox.  Code Status: full code.  Family Communication: none at bedside.  Disposition Plan: pending clinical  improvement, and further management by orthopedics.    Consultants:   Orthopedics Dr Jon House.   Procedures:  MRI left foot.  Antimicrobials: vancomycin and rocephin, flagyl since admission. .    Subjective: Pain better controlled.   Objective: Vitals:   10/04/18 2110 10/05/18 0555 10/05/18 0815 10/05/18 1144  BP: 111/64 107/65 119/69 121/73  Pulse: 88 82 85 84  Resp:      Temp: 98.6 F (37 C) 98.5 F (36.9 C) 98.8 F (37.1 C) 98.7 F (37.1 C)  TempSrc: Oral Oral Oral Oral  SpO2: 96% 99% 96% 97%  Weight:      Height:        Intake/Output Summary (Last 24 hours) at 10/05/2018 1533 Last data filed at 10/05/2018 1051 Gross per 24 hour  Intake 1980 ml  Output -  Net 1980 ml   Filed Weights   10/04/18 0433  Weight: 77.6 kg    Examination:  General exam: Appears calm and comfortable  Respiratory system: Clear to auscultation. Respiratory effort normal. Cardiovascular system: S1 & S2 heard, RRR. No JVD, murmurs,  Gastrointestinal system: Abdomen is nondistended, soft and nontender. No organomegaly or masses felt. Normal bowel sounds heard. Central nervous system: Alert and oriented. No focal neurological deficits. Extremities: left foot swollen, erythematous and tender and pus draining from the wound on the plantar aspect.  Skin: No rashes, lesions or ulcers Psychiatry: Mood & affect appropriate.     Data Reviewed: I have personally reviewed following labs and imaging studies  CBC: Recent Labs  Lab 10/02/18 1016 10/03/18 1912 10/04/18 0328 10/05/18 1034  WBC 10.9* 12.4* 8.9 6.9  NEUTROABS  --  11.1* 7.6  --   HGB 14.1 13.1 12.6* 12.7*  HCT 41.3 38.3* 36.5* 36.4*  MCV 88 88.0 86.7 87.1  PLT 200 194 168 409   Basic Metabolic Panel: Recent Labs  Lab 10/03/18 1912 10/04/18 0328 10/05/18 1034  NA 137 135 134*  K 3.5 3.3* 4.3  CL 98 97* 98  CO2 31 26 24   GLUCOSE 179* 243* 267*  BUN 17 18 17   CREATININE 0.87 0.81 0.75  CALCIUM 9.0 8.6* 8.8*   GFR:  Estimated Creatinine Clearance: 119 mL/min (by C-G formula based on SCr of 0.75 mg/dL). Liver Function Tests: Recent Labs  Lab 10/03/18 1912  AST 15  ALT 15  ALKPHOS 97  BILITOT 0.8  PROT 7.6  ALBUMIN 3.9   No results for input(s): LIPASE, AMYLASE in the last 168 hours. No results for input(s): AMMONIA in the last 168 hours. Coagulation Profile: No results for input(s): INR, PROTIME in the last 168 hours. Cardiac Enzymes: No results for input(s): CKTOTAL, CKMB, CKMBINDEX, TROPONINI in the last 168 hours. BNP (last 3 results) No results for input(s): PROBNP in the last 8760 hours. HbA1C: No results for input(s): HGBA1C in the last 72 hours. CBG: Recent Labs  Lab 10/04/18 1203 10/04/18 1703 10/04/18 2109 10/05/18 0815 10/05/18 1142  GLUCAP 224* 236* 262* 214* 224*   Lipid Profile: No results for input(s): CHOL, HDL, LDLCALC, TRIG, CHOLHDL, LDLDIRECT in the last 72 hours. Thyroid Function Tests: No results for input(s): TSH, T4TOTAL, FREET4, T3FREE, THYROIDAB in the last 72 hours. Anemia Panel: No results for input(s): VITAMINB12, FOLATE, FERRITIN, TIBC, IRON, RETICCTPCT in the last 72 hours. Sepsis Labs: Recent Labs  Lab 10/03/18 1912  LATICACIDVEN 1.2    Recent Results (from the past 240 hour(s))  SARS Coronavirus 2 (CEPHEID - Performed in [facility] hospital lab), Hosp Order     Status: None   Collection Time: 10/04/18  1:21 AM  Result Value Ref Range Status   SARS Coronavirus 2 NEGATIVE NEGATIVE Final    Comment: (NOTE) If result is NEGATIVE SARS-CoV-2 target nucleic acids are NOT DETECTED. The SARS-CoV-2 RNA is generally detectable in upper and lower  respiratory specimens during the acute phase of infection. The lowest  concentration of SARS-CoV-2 viral copies this assay can detect is 250  copies / mL. A negative result does not preclude SARS-CoV-2 infection  and should not be used as the sole basis for treatment or other  patient management decisions.   A negative result may occur with  improper specimen collection / handling, submission of specimen other  than nasopharyngeal swab, presence of viral mutation(s) within the  areas targeted by this assay, and inadequate number of viral copies  (<250 copies / mL). A negative result must be combined with clinical  observations, patient history, and epidemiological information. If result is POSITIVE SARS-CoV-2 target nucleic acids are DETECTED. The SARS-CoV-2 RNA is generally detectable in upper and lower  respiratory specimens dur ing the acute phase of infection.  Positive  results are indicative of active infection with SARS-CoV-2.  Clinical  correlation with patient history and other diagnostic information is  necessary to determine patient infection status.  Positive results do  not rule out bacterial infection or co-infection with other viruses. If result is PRESUMPTIVE POSTIVE SARS-CoV-2 nucleic acids MAY BE PRESENT.   A presumptive positive result was obtained on the submitted specimen  and confirmed on repeat testing.  While 2019 novel coronavirus  (SARS-CoV-2) nucleic acids may be present in the  submitted sample  additional confirmatory testing may be necessary for epidemiological  and / or clinical management purposes  to differentiate between  SARS-CoV-2 and other Sarbecovirus currently known to infect humans.  If clinically indicated additional testing with an alternate test  methodology [phone]([id number]) is advised. The SARS-CoV-2 RNA is generally  detectable in upper and lower respiratory sp ecimens during the acute  phase of infection. The expected result is Negative. Fact Sheet for Patients:  BoilerBrush.com.cyhttps://www.fda.gov/media/136312/download Fact Sheet for Healthcare Providers: https://pope.com/https://www.fda.gov/media/136313/download This test is not yet approved or cleared by the [place] FDA and has been authorized for detection and/or diagnosis of SARS-CoV-2 by FDA under an Emergency Use  Authorization (EUA).  This EUA will remain in effect (meaning this test can be used) for the duration of the COVID-19 declaration under Section 564(b)(1) of the Act, 21 U.S.C. section 360bbb-3(b)(1), unless the authorization is terminated or revoked sooner. Performed at [facility], [address] [place], [place] [address]   Blood Cultures x 2 sites     Status: None (Preliminary result)   Collection Time: 10/04/18  1:48 AM  Result Value Ref Range Status   Specimen Description BLOOD LEFT ARM  Final   Special Requests   Final    BOTTLES DRAWN AEROBIC AND ANAEROBIC Blood Culture adequate volume   Culture   Final    NO GROWTH 1 DAY Performed at [facility], [address] [place], [place] [address]    Report Status PENDING  Incomplete  Blood Cultures x 2 sites     Status: None (Preliminary result)   Collection Time: 10/04/18  3:28 AM  Result Value Ref Range Status   Specimen Description BLOOD LEFT ARM  Final   Special Requests   Final    BOTTLES DRAWN AEROBIC AND ANAEROBIC Blood Culture adequate volume   Culture   Final    NO GROWTH 1 DAY Performed at [facility], [address] [place], [place] [address]    Report Status PENDING  Incomplete         Radiology Studies: Mr Foot Left Wo Contrast  Result Date: 10/05/2018 CLINICAL DATA:  Draining ulcer at the plantar base of the second metatarsal head. Prior great toe amputation. EXAM: MRI OF THE LEFT FOOT WITHOUT CONTRAST TECHNIQUE: Multiplanar, multisequence MR imaging of the left forefoot was performed. No intravenous contrast was administered. COMPARISON:  Left foot x-rays dated October 03, 2018. FINDINGS: Bones/Joint/Cartilage Abnormal marrow edema with decreased T1 marrow signal involving the entire second metatarsal. Similar appearing signal in the lateral cuneiform pathologic fracture of the second metatarsal head and neck. Faint marrow edema in the third metatarsal with preserved T1 marrow signal, consistent  with reactive osteitis. Dorsal and medial subluxation at the second MTP joint. Large second MTP joint effusion. Small third MTP joint effusion. Prior great toe amputation. Degenerative subchondral marrow edema in the distal medial cuneiform at the first TMT joint. Ligaments The lateral collateral ligament at the second MTP joint is torn. Remaining collateral ligaments are intact. Muscles and Tendons Flexor, peroneal and extensor compartment tendons are intact. Increased T2 signal within the intrinsic muscles of the forefoot, nonspecific, but likely related to diabetic muscle changes. Soft tissue Deep soft tissue ulceration at the plantar base of the second MTP joint, extending to the joint. No fluid collection or hematoma. No soft tissue mass. IMPRESSION: 1. Soft tissue ulceration at the plantar base of the second MTP joint with extension to the joint. Underlying second MTP  joint septic arthritis with osteomyelitis of the entire second metatarsal, as well as the lateral cuneiform. 2. Small third MTP joint effusion, potentially reactive, but septic arthritis is not excluded. Reactive osteitis of the third metatarsal. 3. No abscess. 4. Pathologic fracture of the second metatarsal head and neck. Electronically Signed   By: Obie DredgeWilliam T Derry M.D.   On: 10/05/2018 09:19   Dg Foot Complete Left  Result Date: 10/03/2018 CLINICAL DATA:  Right foot pain after stepping on nail EXAM: LEFT FOOT - COMPLETE 3+ VIEW COMPARISON:  None. FINDINGS: There is no evidence of fracture or dislocation. Status post left great toe amputation. No osteolysis. There is no evidence of arthropathy or other focal bone abnormality. Soft tissues are unremarkable. IMPRESSION: Negative. Electronically Signed   By: Deatra RobinsonKevin  Herman M.D.   On: 10/03/2018 20:19        Scheduled Meds: . feeding supplement (GLUCERNA SHAKE)  237 mL Oral BID WC  . insulin aspart  0-15 Units Subcutaneous TID WC  . insulin aspart  0-5 Units Subcutaneous QHS  . insulin  glargine  8 Units Subcutaneous QHS  . nutrition supplement (JUVEN)  1 packet Oral BID BM  . sodium chloride flush  3 mL Intravenous Q12H   Continuous Infusions: . sodium chloride    . cefTRIAXone (ROCEPHIN)  IV Stopped (10/05/18 0537)  . metronidazole 500 mg (10/05/18 0927)  . vancomycin 1,250 mg (10/05/18 1024)     LOS: 0 days    Time spent: 36 minutes.     Kathlen ModyVijaya Anedra Penafiel, MD [facility] Pager [phone]   If 7PM-7AM, please contact night-coverage [email or web address] Password [email or web address] 10/05/2018, 3:33 PM

## 2018-10-06 LAB — CBC
HCT: 36.6 % — ABNORMAL LOW (ref 39.0–52.0)
Hemoglobin: 12.7 g/dL — ABNORMAL LOW (ref 13.0–17.0)
MCH: 30.2 pg (ref 26.0–34.0)
MCHC: 34.7 g/dL (ref 30.0–36.0)
MCV: 86.9 fL (ref 80.0–100.0)
Platelets: 221 10*3/uL (ref 150–400)
RBC: 4.21 MIL/uL — ABNORMAL LOW (ref 4.22–5.81)
RDW: 12.1 % (ref 11.5–15.5)
WBC: 5.5 10*3/uL (ref 4.0–10.5)
nRBC: 0 % (ref 0.0–0.2)

## 2018-10-06 LAB — BASIC METABOLIC PANEL
Anion gap: 9 (ref 5–15)
BUN: 17 mg/dL (ref 6–20)
CO2: 28 mmol/L (ref 22–32)
Calcium: 8.9 mg/dL (ref 8.9–10.3)
Chloride: 99 mmol/L (ref 98–111)
Creatinine, Ser: 0.68 mg/dL (ref 0.61–1.24)
GFR calc Af Amer: >60 mL/min (ref >60)
GFR calc non Af Amer: >60 mL/min (ref >60)
Glucose, Bld: 331 mg/dL — ABNORMAL HIGH (ref 70–99)
Potassium: 4.3 mmol/L (ref 3.5–5.1)
Sodium: 136 mmol/L (ref 135–145)

## 2018-10-06 LAB — GLUCOSE, CAPILLARY
Glucose-Capillary: 248 mg/dL — ABNORMAL HIGH (ref 70–99)
Glucose-Capillary: 229 mg/dL — ABNORMAL HIGH (ref 70–99)
Glucose-Capillary: 231 mg/dL — ABNORMAL HIGH (ref 70–99)
Glucose-Capillary: 308 mg/dL — ABNORMAL HIGH (ref 70–99)

## 2018-10-06 LAB — C-REACTIVE PROTEIN: CRP: 13.7 mg/dL — ABNORMAL HIGH (ref <1.0)

## 2018-10-06 LAB — SEDIMENTATION RATE: Sed Rate: 69 mm/hr — ABNORMAL HIGH (ref 0–16)

## 2018-10-06 MED ORDER — INSULIN ASPART 100 UNIT/ML ~~LOC~~ SOLN
3.0000 [IU] | Freq: Three times a day (TID) | SUBCUTANEOUS | Status: DC
  Administered 2018-10-06 – 2018-10-07 (×2): 3 [IU] via SUBCUTANEOUS

## 2018-10-06 MED ORDER — INSULIN GLARGINE 100 UNIT/ML ~~LOC~~ SOLN
18.0000 [IU] | Freq: Every day | SUBCUTANEOUS | Status: DC
  Administered 2018-10-06: 18 [IU] via SUBCUTANEOUS
  Filled 2018-10-06 (×2): qty 0.18

## 2018-10-06 NOTE — Plan of Care (Signed)

## 2018-10-06 NOTE — Progress Notes (Signed)
PROGRESS NOTE    [name]  WUJ:[id number] DOB: 04/22/1971 DOA: 10/03/2018 PCP: Antony Blackbird, MD   Brief Narrative:   Jon House a 48 y.o.malewith medical history significant forpoorly controlled insulin-dependent diabetes mellitus and left great toe osteomyelitis status post amputation in [place] presenting to emergency department for evaluation of worsening swelling, pain, and redness surrounding a wound on the plantar aspect of his left forefoot.   He was admitted for left foot wound and started on broad spectrum antibiotics.  Consulted Dr Sharol Given and MRI of the left foot ordered.   It shows Soft tissue ulceration at the plantar base of the second MTP joint with extension to the joint. Underlying second MTP joint septic arthritis with osteomyelitis of the entire second metatarsal, as well as the lateral cuneiform. Small third MTP joint effusion, potentially reactive, but septic arthritis is not excluded. Reactive osteitis of the third metatarsal. Pathologic fracture of the second metatarsal head and neck.  Assessment & Plan:   Principal Problem:   Diabetic infection of left foot ([place]) Active Problems:   Uncontrolled insulin dependent diabetes mellitus ([facility])   Osteomyelitis of the entire second metatarsal, lateral cuneiform of the left foot: Currently on broad spectrum IV antibiotics.  Dr Sharol Given on board. Discussed the results of the MRI showing  osteomyelitis of the second metatarsal on the left.  Pain control. He will probably need amputation.  Pt afebrile and leukocytosis resolved.   Hypokalemia:  Replaced.    Uncontrolled DM with hyperglycemia:  CBG (last 3)  Recent Labs    10/05/18 1955 10/06/18 0848 10/06/18 1203  GLUCAP 274* 248* 229*   Not well controlled.  Increase the lantus to 18 units, home dose, in addition to mod SSI.  Add 3 units TIDAC.    Mild anemia of chronic disease:  Hemoglobin stable around 12.    DVT prophylaxis:  lovenox.  Code Status: full code.  Family Communication: none at bedside.  Disposition Plan: pending clinical improvement, and further management by orthopedics.    Consultants:   Orthopedics Dr Sharol Given.   Procedures:  MRI left foot.  Antimicrobials: vancomycin and rocephin, flagyl since admission. .    Subjective: Pain better controlled.   Objective: Vitals:   10/05/18 0815 10/05/18 1144 10/05/18 1720 10/05/18 2033  BP: 119/69 121/73 121/70 101/63  Pulse: 85 84 88 84  Resp:    16  Temp: 98.8 F (37.1 C) 98.7 F (37.1 C) 99.6 F (37.6 C) 98.4 F (36.9 C)  TempSrc: Oral Oral Oral Oral  SpO2: 96% 97% 96% 96%  Weight:      Height:        Intake/Output Summary (Last 24 hours) at 10/06/2018 1232 Last data filed at 10/06/2018 0848 Gross per 24 hour  Intake 477 ml  Output -  Net 477 ml   Filed Weights   10/04/18 0433  Weight: 77.6 kg    Examination:  General exam: Appears calm and comfortable , NOT IN DISTRESS.  Respiratory system: Clear to auscultation. Respiratory effort normal. No wheezing or rhonchi.  Cardiovascular system: S1 & S2 heard, RRR. No JVD, murmurs,  Gastrointestinal system: Abdomen is soft , non tender ND BS+ Central nervous system: Alert and oriented. No focal neurological deficits. Extremities: left foot swollen, erythematous and tender and pus draining from the wound on the plantar aspect.  Skin: No rashes, lesions or ulcers Psychiatry: Mood & affect appropriate.     Data Reviewed: I have personally reviewed following labs and imaging studies  CBC: Recent Labs  Lab 10/02/18 1016 10/03/18 1912 10/04/18 0328 10/05/18 1034 10/06/18 0939  WBC 10.9* 12.4* 8.9 6.9 5.5  NEUTROABS  --  11.1* 7.6  --   --   HGB 14.1 13.1 12.6* 12.7* 12.7*  HCT 41.3 38.3* 36.5* 36.4* 36.6*  MCV 88 88.0 86.7 87.1 86.9  PLT 200 194 168 201 221   Basic Metabolic Panel: Recent Labs  Lab 10/03/18 1912 10/04/18 0328 10/05/18 1034 10/06/18 0939  NA 137 135 134*  136  K 3.5 3.3* 4.3 4.3  CL 98 97* 98 99  CO2 31 26 24 28   GLUCOSE 179* 243* 267* 331*  BUN 17 18 17 17   CREATININE 0.87 0.81 0.75 0.68  CALCIUM 9.0 8.6* 8.8* 8.9   GFR: Estimated Creatinine Clearance: 119 mL/min (by C-G formula based on SCr of 0.68 mg/dL). Liver Function Tests: Recent Labs  Lab 10/03/18 1912  AST 15  ALT 15  ALKPHOS 97  BILITOT 0.8  PROT 7.6  ALBUMIN 3.9   No results for input(s): LIPASE, AMYLASE in the last 168 hours. No results for input(s): AMMONIA in the last 168 hours. Coagulation Profile: No results for input(s): INR, PROTIME in the last 168 hours. Cardiac Enzymes: No results for input(s): CKTOTAL, CKMB, CKMBINDEX, TROPONINI in the last 168 hours. BNP (last 3 results) No results for input(s): PROBNP in the last 8760 hours. HbA1C: No results for input(s): HGBA1C in the last 72 hours. CBG: Recent Labs  Lab 10/05/18 1142 10/05/18 1717 10/05/18 1955 10/06/18 0848 10/06/18 1203  GLUCAP 224* 231* 274* 248* 229*   Lipid Profile: No results for input(s): CHOL, HDL, LDLCALC, TRIG, CHOLHDL, LDLDIRECT in the last 72 hours. Thyroid Function Tests: No results for input(s): TSH, T4TOTAL, FREET4, T3FREE, THYROIDAB in the last 72 hours. Anemia Panel: No results for input(s): VITAMINB12, FOLATE, FERRITIN, TIBC, IRON, RETICCTPCT in the last 72 hours. Sepsis Labs: Recent Labs  Lab 10/03/18 1912  LATICACIDVEN 1.2    Recent Results (from the past 240 hour(s))  SARS Coronavirus 2 (CEPHEID - Performed in [facility] lab), Hosp Order     Status: None   Collection Time: 10/04/18  1:21 AM  Result Value Ref Range Status   SARS Coronavirus 2 NEGATIVE NEGATIVE Final    Comment: (NOTE) If result is NEGATIVE SARS-CoV-2 target nucleic acids are NOT DETECTED. The SARS-CoV-2 RNA is generally detectable in upper and lower  respiratory specimens during the acute phase of infection. The lowest  concentration of SARS-CoV-2 viral copies this assay can detect  is 250  copies / mL. A negative result does not preclude SARS-CoV-2 infection  and should not be used as the sole basis for treatment or other  patient management decisions.  A negative result may occur with  improper specimen collection / handling, submission of specimen other  than nasopharyngeal swab, presence of viral mutation(s) within the  areas targeted by this assay, and inadequate number of viral copies  (<250 copies / mL). A negative result must be combined with clinical  observations, patient history, and epidemiological information. If result is POSITIVE SARS-CoV-2 target nucleic acids are DETECTED. The SARS-CoV-2 RNA is generally detectable in upper and lower  respiratory specimens dur ing the acute phase of infection.  Positive  results are indicative of active infection with SARS-CoV-2.  Clinical  correlation with patient history and other diagnostic information is  necessary to determine patient infection status.  Positive results do  not rule out bacterial infection or co-infection with other  viruses. If result is PRESUMPTIVE POSTIVE SARS-CoV-2 nucleic acids MAY BE PRESENT.   A presumptive positive result was obtained on the submitted specimen  and confirmed on repeat testing.  While 2019 novel coronavirus  (SARS-CoV-2) nucleic acids may be present in the submitted sample  additional confirmatory testing may be necessary for epidemiological  and / or clinical management purposes  to differentiate between  SARS-CoV-2 and other Sarbecovirus currently known to infect humans.  If clinically indicated additional testing with an alternate test  methodology [phone] is advised. The SARS-CoV-2 RNA is generally  detectable in upper and lower respiratory sp ecimens during the acute  phase of infection. The expected result is Negative. Fact Sheet for Patients:  BoilerBrush.com.cyhttps://www.fda.gov/media/136312/download Fact Sheet for Healthcare Providers:  https://pope.com/https://www.fda.gov/media/136313/download This test is not yet approved or cleared by the [place] FDA and has been authorized for detection and/or diagnosis of SARS-CoV-2 by FDA under an Emergency Use Authorization (EUA).  This EUA will remain in effect (meaning this test can be used) for the duration of the COVID-19 declaration under Section 564(b)(1) of the Act, 21 U.S.C. section 360bbb-3(b)(1), unless the authorization is terminated or revoked sooner. Performed at [facility], [address] [place], [place] [address]   Blood Cultures x 2 sites     Status: None (Preliminary result)   Collection Time: 10/04/18  1:48 AM  Result Value Ref Range Status   Specimen Description BLOOD LEFT ARM  Final   Special Requests   Final    BOTTLES DRAWN AEROBIC AND ANAEROBIC Blood Culture adequate volume   Culture   Final    NO GROWTH 2 DAYS Performed at [facility], [address] [place], [place] [address]    Report Status PENDING  Incomplete  Blood Cultures x 2 sites     Status: None (Preliminary result)   Collection Time: 10/04/18  3:28 AM  Result Value Ref Range Status   Specimen Description BLOOD LEFT ARM  Final   Special Requests   Final    BOTTLES DRAWN AEROBIC AND ANAEROBIC Blood Culture adequate volume   Culture   Final    NO GROWTH 2 DAYS Performed at [facility], [address] [place], [place] [address]    Report Status PENDING  Incomplete         Radiology Studies: Mr Foot Left Wo Contrast  Result Date: 10/05/2018 CLINICAL DATA:  Draining ulcer at the plantar base of the second metatarsal head. Prior great toe amputation. EXAM: MRI OF THE LEFT FOOT WITHOUT CONTRAST TECHNIQUE: Multiplanar, multisequence MR imaging of the left forefoot was performed. No intravenous contrast was administered. COMPARISON:  Left foot x-rays dated October 03, 2018. FINDINGS: Bones/Joint/Cartilage Abnormal marrow edema with decreased T1 marrow signal involving the entire  second metatarsal. Similar appearing signal in the lateral cuneiform pathologic fracture of the second metatarsal head and neck. Faint marrow edema in the third metatarsal with preserved T1 marrow signal, consistent with reactive osteitis. Dorsal and medial subluxation at the second MTP joint. Large second MTP joint effusion. Small third MTP joint effusion. Prior great toe amputation. Degenerative subchondral marrow edema in the distal medial cuneiform at the first TMT joint. Ligaments The lateral collateral ligament at the second MTP joint is torn. Remaining collateral ligaments are intact. Muscles and Tendons Flexor, peroneal and extensor compartment tendons are intact. Increased T2 signal within the intrinsic muscles of the forefoot, nonspecific, but likely related to diabetic muscle changes. Soft tissue Deep soft tissue ulceration  at the plantar base of the second MTP joint, extending to the joint. No fluid collection or hematoma. No soft tissue mass. IMPRESSION: 1. Soft tissue ulceration at the plantar base of the second MTP joint with extension to the joint. Underlying second MTP joint septic arthritis with osteomyelitis of the entire second metatarsal, as well as the lateral cuneiform. 2. Small third MTP joint effusion, potentially reactive, but septic arthritis is not excluded. Reactive osteitis of the third metatarsal. 3. No abscess. 4. Pathologic fracture of the second metatarsal head and neck. Electronically Signed   By: Obie DredgeWilliam T Derry M.D.   On: 10/05/2018 09:19        Scheduled Meds: . enoxaparin (LOVENOX) injection  40 mg Subcutaneous Q24H  . feeding supplement (GLUCERNA SHAKE)  237 mL Oral BID WC  . insulin aspart  0-15 Units Subcutaneous TID WC  . insulin aspart  0-5 Units Subcutaneous QHS  . insulin aspart  3 Units Subcutaneous TID WC  . insulin glargine  18 Units Subcutaneous QHS  . nutrition supplement (JUVEN)  1 packet Oral BID BM  . sodium chloride flush  3 mL Intravenous Q12H    Continuous Infusions: . sodium chloride    . cefTRIAXone (ROCEPHIN)  IV 2 g (10/06/18 16100621)  . metronidazole 500 mg (10/06/18 0940)  . vancomycin 1,250 mg (10/06/18 1214)     LOS: 1 day    Time spent: 28 minutes.     Kathlen ModyVijaya Kimya Mccahill, MD [facility] Pager [phone]   If 7PM-7AM, please contact night-coverage [email or web address] Password [email or web address] 10/06/2018, 12:32 PM

## 2018-10-07 LAB — GLUCOSE, CAPILLARY
Glucose-Capillary: 215 mg/dL — ABNORMAL HIGH (ref 70–99)
Glucose-Capillary: 279 mg/dL — ABNORMAL HIGH (ref 70–99)
Glucose-Capillary: 99 mg/dL (ref 70–99)
Glucose-Capillary: 150 mg/dL — ABNORMAL HIGH (ref 70–99)

## 2018-10-07 LAB — SURGICAL PCR SCREEN
MRSA, PCR: NEGATIVE
Staphylococcus aureus: NEGATIVE

## 2018-10-07 MED ORDER — METRONIDAZOLE 500 MG PO TABS
500.0000 mg | ORAL_TABLET | Freq: Three times a day (TID) | ORAL | Status: AC
  Administered 2018-10-07 – 2018-10-10 (×12): 500 mg via ORAL
  Filled 2018-10-07 (×12): qty 1

## 2018-10-07 MED ORDER — INSULIN GLARGINE 100 UNIT/ML ~~LOC~~ SOLN
10.0000 [IU] | Freq: Every day | SUBCUTANEOUS | Status: DC
  Administered 2018-10-07 – 2018-10-11 (×5): 10 [IU] via SUBCUTANEOUS
  Filled 2018-10-07 (×5): qty 0.1

## 2018-10-07 MED ORDER — INSULIN GLARGINE 100 UNIT/ML ~~LOC~~ SOLN
20.0000 [IU] | Freq: Every day | SUBCUTANEOUS | Status: DC
  Administered 2018-10-08 – 2018-10-10 (×4): 20 [IU] via SUBCUTANEOUS
  Filled 2018-10-07 (×8): qty 0.2

## 2018-10-07 MED ORDER — INSULIN ASPART 100 UNIT/ML ~~LOC~~ SOLN
5.0000 [IU] | Freq: Three times a day (TID) | SUBCUTANEOUS | Status: DC
  Administered 2018-10-07: 5 [IU] via SUBCUTANEOUS

## 2018-10-07 NOTE — Progress Notes (Signed)
Patient ID: Jon House, male   DOB: 1971/04/24, 48 y.o.   MRN: [id number]  Review of the MRI scan shows osteomyelitis involving the entire second metatarsal there may be some infection extending over to the third MTP joint.  I discussed with the patient that at a minimum we would proceed with a amputation of the second ray including amputation of the second toe.  If the infection extends over to the third toe he may require amputation of the third toe as well.  Plan for surgery on Wednesday.

## 2018-10-07 NOTE — Progress Notes (Signed)
Report given to 28M RN, Pt not in distress, all questions and concerns addressed, transported to 28M with belongings.

## 2018-10-07 NOTE — Plan of Care (Signed)

## 2018-10-07 NOTE — Progress Notes (Signed)
NEW ADMISSION NOTE New Admission Note:   Arrival Method: 5N in bed  Mental Orientation: alert and oriented x4  Telemetry: NA Assessment: Completed Skin: great toe on Left foot amputated (not this admission) IV: Left upper arm  Pain: 0 Tubes: NA Safety Measures: Safety Fall Prevention Plan has been given, discussed and signed Admission: Completed [facility] Orientation: Patient has been orientated to the room, unit and staff.  Family: NA  Orders have been reviewed and implemented. Will continue to monitor the patient. Call light has been placed within reach and bed alarm has been activated.   Baldo Ash, RN

## 2018-10-07 NOTE — Progress Notes (Signed)
Inpatient Diabetes Program Recommendations  AACE/ADA: New Consensus Statement on Inpatient Glycemic Control (2015)  Target Ranges:  Prepandial:   less than 140 mg/dL      Peak postprandial:   less than 180 mg/dL (1-2 hours)      Critically ill patients:  140 - 180 mg/dL   Lab Results  Component Value Date   GLUCAP 215 (H) 10/07/2018   HGBA1C 9.6 (A) 10/02/2018    Review of Glycemic Control Results for Jon House, Jon House (MRN [id number]) as of 10/07/2018 09:46  Ref. Range 10/06/2018 12:03 10/06/2018 15:53 10/06/2018 21:02 10/07/2018 07:36  Glucose-Capillary Latest Ref Range: 70 - 99 mg/dL 229 (H) 231 (H) 308 (H) 215 (H)   Diabetes history: Type 2 DM Outpatient Diabetes medications: Metformin 1000 mg BID, Novolin 70/30 10 units BID, Basaglar 10 units QHS Current orders for Inpatient glycemic control: Lantus 18 units QHS, Novolog 3 units TID, Novolog 0-15 units TID, Novolog 0-5 units QHS  Inpatient Diabetes Program Recommendations:    Note: Scheduled insulin should be given with in one hour of last CBG.   Consider increasing Lantus to 20 units QHS and increase meal coverage to Novolog 5 units TID (asusming that patient is consuming >50% of meal, to be given WITH meal).  Thanks, Bronson Curb, MSN, RNC-OB Diabetes Coordinator [phone] (8a-5p)

## 2018-10-07 NOTE — Progress Notes (Signed)
Pharmacy Antibiotic Note  Jon House is a 48 y.o. male admitted on 10/03/2018 with diabetic foot wound.    Amputation planned for Wednesday Scr stable, WBC stable  Plan: Continue Vancomycin 1250 mg IV q12h >>Esimated AUC: 468 Continue Ceftriaxone/Flagyl per MD   Temp (24hrs), Avg:97.9 F (36.6 C), Min:97.5 F (36.4 C), Max:98.5 F (36.9 C)  Recent Labs  Lab 10/02/18 1016 10/03/18 1912 10/04/18 0328 10/05/18 1034 10/06/18 0939  WBC 10.9* 12.4* 8.9 6.9 5.5  CREATININE  --  0.87 0.81 0.75 0.68  LATICACIDVEN  --  1.2  --   --   --     Estimated Creatinine Clearance: 119 mL/min (by C-G formula based on SCr of 0.68 mg/dL).    Allergies  Allergen Reactions  . Bee Venom Hives   Thank you Anette Guarneri, PharmD [phone] 10/07/2018 8:38 AM

## 2018-10-07 NOTE — Progress Notes (Signed)
PROGRESS NOTE    Jon House  ZOX:[id number] DOB: 12-15-1970 DOA: 10/03/2018 PCP: Cain SaupeFulp, Cammie, MD   Brief Narrative:   Jon House a 48 y.o.malewith medical history significant forpoorly controlled insulin-dependent diabetes mellitus and left great toe osteomyelitis status post amputation in March,now presenting to emergency department for evaluation of worsening swelling, pain, and redness surrounding a wound on the plantar aspect of his left forefoot.   He was admitted for left foot wound and started on broad spectrum antibiotics.  Consulted Dr Lajoyce Cornersuda and MRI of the left foot ordered.   It shows Soft tissue ulceration at the plantar base of the second MTP joint with extension to the joint. Underlying second MTP joint septic arthritis with osteomyelitis of the entire second metatarsal, as well as the lateral cuneiform. Small third MTP joint effusion, potentially reactive, but septic arthritis is not excluded. Reactive osteitis of the third metatarsal. Pathologic fracture of the second metatarsal head and neck.  Assessment & Plan:   Principal Problem:   Diabetic infection of left foot (HCC) Active Problems:   Uncontrolled insulin dependent diabetes mellitus (HCC)   Osteomyelitis of the entire second metatarsal, lateral cuneiform of the left foot: Currently on broad spectrum IV antibiotics.  Dr Lajoyce Cornersuda on board. Discussed the results of the MRI showing  osteomyelitis of the second metatarsal on the left.  Pain control. He will probably amputation of the second ray including amputation of the second toe. Scheduled for Wednesday 10/09/2018. Pt afebrile and leukocytosis resolved.   Hypokalemia:  Replaced.    Uncontrolled DM with hyperglycemia:  CBG (last 3)  Recent Labs    10/06/18 1553 10/06/18 2102 10/07/18 0736  GLUCAP 231* 308* 215*   Not well controlled.  Add lantus 10 units in the am and 20 units at night ,  Add 5 units TIDAC. And continue with SSI.   A1c is 9.6   Mild anemia of chronic disease:  Hemoglobin stable around 12.    DVT prophylaxis: lovenox.  Code Status: full code.  Family Communication: none at bedside.  Disposition Plan: pending clinical improvement, and further management by orthopedics.    Consultants:   Orthopedics Dr Lajoyce Cornersuda.   Procedures:  MRI left foot.  Antimicrobials: vancomycin and rocephin, flagyl since admission .    Subjective: Pain better controlled. No new complaints.   Objective: Vitals:   10/06/18 1900 10/06/18 1947 10/07/18 0432 10/07/18 0747  BP: 126/90 118/70 118/71 105/65  Pulse: 86 85 82 83  Resp:    16  Temp: 97.8 F (36.6 C) 97.8 F (36.6 C) 98.5 F (36.9 C) (!) 97.5 F (36.4 C)  TempSrc: Oral Oral Oral Oral  SpO2: 96% 96% 97% 96%  Weight:      Height:        Intake/Output Summary (Last 24 hours) at 10/07/2018 1018 Last data filed at 10/07/2018 0900 Gross per 24 hour  Intake 843 ml  Output -  Net 843 ml   Filed Weights   10/04/18 0433  Weight: 77.6 kg    Examination:  General exam: calm and comfortable.  Respiratory system: air entry fair, no wheezing or rhonchi.  Cardiovascular system: S1 & S2 heard, RRR. No JVD, murmurs,  Gastrointestinal system: Abdomen is soft , non tender ND BS+ Central nervous system: Alert and oriented. No focal neurological deficits. Extremities: left foot swollen, erythematous and tender and pus draining from the wound on the plantar aspect.  Skin: No rashes, lesions or ulcers Psychiatry: Mood & affect appropriate.  Data Reviewed: I have personally reviewed following labs and imaging studies  CBC: Recent Labs  Lab 10/02/18 1016 10/03/18 1912 10/04/18 0328 10/05/18 1034 10/06/18 0939  WBC 10.9* 12.4* 8.9 6.9 5.5  NEUTROABS  --  11.1* 7.6  --   --   HGB 14.1 13.1 12.6* 12.7* 12.7*  HCT 41.3 38.3* 36.5* 36.4* 36.6*  MCV 88 88.0 86.7 87.1 86.9  PLT 200 194 168 201 221   Basic Metabolic Panel: Recent Labs  Lab 10/03/18 1912  10/04/18 0328 10/05/18 1034 10/06/18 0939  NA 137 135 134* 136  K 3.5 3.3* 4.3 4.3  CL 98 97* 98 99  CO2 31 26 24 28   GLUCOSE 179* 243* 267* 331*  BUN 17 18 17 17   CREATININE 0.87 0.81 0.75 0.68  CALCIUM 9.0 8.6* 8.8* 8.9   GFR: Estimated Creatinine Clearance: 119 mL/min (by C-G formula based on SCr of 0.68 mg/dL). Liver Function Tests: Recent Labs  Lab 10/03/18 1912  AST 15  ALT 15  ALKPHOS 97  BILITOT 0.8  PROT 7.6  ALBUMIN 3.9   No results for input(s): LIPASE, AMYLASE in the last 168 hours. No results for input(s): AMMONIA in the last 168 hours. Coagulation Profile: No results for input(s): INR, PROTIME in the last 168 hours. Cardiac Enzymes: No results for input(s): CKTOTAL, CKMB, CKMBINDEX, TROPONINI in the last 168 hours. BNP (last 3 results) No results for input(s): PROBNP in the last 8760 hours. HbA1C: No results for input(s): HGBA1C in the last 72 hours. CBG: Recent Labs  Lab 10/06/18 0848 10/06/18 1203 10/06/18 1553 10/06/18 2102 10/07/18 0736  GLUCAP 248* 229* 231* 308* 215*   Lipid Profile: No results for input(s): CHOL, HDL, LDLCALC, TRIG, CHOLHDL, LDLDIRECT in the last 72 hours. Thyroid Function Tests: No results for input(s): TSH, T4TOTAL, FREET4, T3FREE, THYROIDAB in the last 72 hours. Anemia Panel: No results for input(s): VITAMINB12, FOLATE, FERRITIN, TIBC, IRON, RETICCTPCT in the last 72 hours. Sepsis Labs: Recent Labs  Lab 10/03/18 1912  LATICACIDVEN 1.2    Recent Results (from the past 240 hour(s))  SARS Coronavirus 2 (CEPHEID - Performed in [facility] hospital lab), Hosp Order     Status: None   Collection Time: 10/04/18  1:21 AM  Result Value Ref Range Status   SARS Coronavirus 2 NEGATIVE NEGATIVE Final    Comment: (NOTE) If result is NEGATIVE SARS-CoV-2 target nucleic acids are NOT DETECTED. The SARS-CoV-2 RNA is generally detectable in upper and lower  respiratory specimens during the acute phase of infection. The lowest   concentration of SARS-CoV-2 viral copies this assay can detect is 250  copies / mL. A negative result does not preclude SARS-CoV-2 infection  and should not be used as the sole basis for treatment or other  patient management decisions.  A negative result may occur with  improper specimen collection / handling, submission of specimen other  than nasopharyngeal swab, presence of viral mutation(s) within the  areas targeted by this assay, and inadequate number of viral copies  (<250 copies / mL). A negative result must be combined with clinical  observations, patient history, and epidemiological information. If result is POSITIVE SARS-CoV-2 target nucleic acids are DETECTED. The SARS-CoV-2 RNA is generally detectable in upper and lower  respiratory specimens dur ing the acute phase of infection.  Positive  results are indicative of active infection with SARS-CoV-2.  Clinical  correlation with patient history and other diagnostic information is  necessary to determine patient infection status.  Positive  results do  not rule out bacterial infection or co-infection with other viruses. If result is PRESUMPTIVE POSTIVE SARS-CoV-2 nucleic acids MAY BE PRESENT.   A presumptive positive result was obtained on the submitted specimen  and confirmed on repeat testing.  While 2019 novel coronavirus  (SARS-CoV-2) nucleic acids may be present in the submitted sample  additional confirmatory testing may be necessary for epidemiological  and / or clinical management purposes  to differentiate between  SARS-CoV-2 and other Sarbecovirus currently known to infect humans.  If clinically indicated additional testing with an alternate test  methodology [phone]) is advised. The SARS-CoV-2 RNA is generally  detectable in upper and lower respiratory sp ecimens during the acute  phase of infection. The expected result is Negative. Fact Sheet for Patients:  StrictlyIdeas.no Fact Sheet  for Healthcare Providers: BankingDealers.co.za This test is not yet approved or cleared by the [place] FDA and has been authorized for detection and/or diagnosis of SARS-CoV-2 by FDA under an Emergency Use Authorization (EUA).  This EUA will remain in effect (meaning this test can be used) for the duration of the COVID-19 declaration under Section 564(b)(1) of the Act, 21 U.S.C. section 360bbb-3(b)(1), unless the authorization is terminated or revoked sooner. Performed at [facility], [place] [address], [place], [place] [address]   Blood Cultures x 2 sites     Status: None (Preliminary result)   Collection Time: 10/04/18  1:48 AM  Result Value Ref Range Status   Specimen Description BLOOD LEFT ARM  Final   Special Requests   Final    BOTTLES DRAWN AEROBIC AND ANAEROBIC Blood Culture adequate volume   Culture   Final    NO GROWTH 2 DAYS Performed at [facility] [address], [place], [place] [address]    Report Status PENDING  Incomplete  Blood Cultures x 2 sites     Status: None (Preliminary result)   Collection Time: 10/04/18  3:28 AM  Result Value Ref Range Status   Specimen Description BLOOD LEFT ARM  Final   Special Requests   Final    BOTTLES DRAWN AEROBIC AND ANAEROBIC Blood Culture adequate volume   Culture   Final    NO GROWTH 2 DAYS Performed at [facility], [place] [address], [place], [place] [address]    Report Status PENDING  Incomplete         Radiology Studies: No results found.      Scheduled Meds: . enoxaparin (LOVENOX) injection  40 mg Subcutaneous Q24H  . feeding supplement (GLUCERNA SHAKE)  237 mL Oral BID WC  . insulin aspart  0-15 Units Subcutaneous TID WC  . insulin aspart  0-5 Units Subcutaneous QHS  . insulin aspart  5 Units Subcutaneous TID WC  . insulin glargine  10 Units Subcutaneous Daily  . insulin glargine  20 Units Subcutaneous QHS  . metroNIDAZOLE  500 mg Oral Q8H  . nutrition  supplement (JUVEN)  1 packet Oral BID BM  . sodium chloride flush  3 mL Intravenous Q12H   Continuous Infusions: . sodium chloride    . cefTRIAXone (ROCEPHIN)  IV 2 g (10/07/18 0533)  . vancomycin 1,250 mg (10/07/18 0946)     LOS: 2 days    Time spent: 26 minutes.     Hosie Poisson, MD [facility] Pager [phone]   If 7PM-7AM, please contact night-coverage [email or web address] Password [email or web address] 10/07/2018, 10:18 AM

## 2018-10-07 NOTE — H&P (View-Only) (Signed)
Patient ID: Jon House, male   DOB: 08/18/1970, 47 y.o.   MRN: [id number]  Review of the MRI scan shows osteomyelitis involving the entire second metatarsal there may be some infection extending over to the third MTP joint.  I discussed with the patient that at a minimum we would proceed with a amputation of the second ray including amputation of the second toe.  If the infection extends over to the third toe he may require amputation of the third toe as well.  Plan for surgery on Wednesday. 

## 2018-10-08 LAB — CBC
HCT: 36.7 % — ABNORMAL LOW (ref 39.0–52.0)
Hemoglobin: 12.5 g/dL — ABNORMAL LOW (ref 13.0–17.0)
MCH: 29.6 pg (ref 26.0–34.0)
MCHC: 34.1 g/dL (ref 30.0–36.0)
MCV: 87 fL (ref 80.0–100.0)
Platelets: 272 10*3/uL (ref 150–400)
RBC: 4.22 MIL/uL (ref 4.22–5.81)
RDW: 12 % (ref 11.5–15.5)
WBC: 5.2 10*3/uL (ref 4.0–10.5)
nRBC: 0 % (ref 0.0–0.2)

## 2018-10-08 LAB — BASIC METABOLIC PANEL
Anion gap: 9 (ref 5–15)
BUN: 16 mg/dL (ref 6–20)
CO2: 29 mmol/L (ref 22–32)
Calcium: 8.8 mg/dL — ABNORMAL LOW (ref 8.9–10.3)
Chloride: 99 mmol/L (ref 98–111)
Creatinine, Ser: 0.72 mg/dL (ref 0.61–1.24)
GFR calc Af Amer: >60 mL/min (ref >60)
GFR calc non Af Amer: >60 mL/min (ref >60)
Glucose, Bld: 241 mg/dL — ABNORMAL HIGH (ref 70–99)
Potassium: 3.9 mmol/L (ref 3.5–5.1)
Sodium: 137 mmol/L (ref 135–145)

## 2018-10-08 LAB — GLUCOSE, CAPILLARY
Glucose-Capillary: 110 mg/dL — ABNORMAL HIGH (ref 70–99)
Glucose-Capillary: 207 mg/dL — ABNORMAL HIGH (ref 70–99)
Glucose-Capillary: 127 mg/dL — ABNORMAL HIGH (ref 70–99)
Glucose-Capillary: 175 mg/dL — ABNORMAL HIGH (ref 70–99)

## 2018-10-08 MED ORDER — POVIDONE-IODINE 10 % EX SWAB: 2.0000 "application " | Freq: Once | CUTANEOUS | Status: DC

## 2018-10-08 MED ORDER — ENSURE PRE-SURGERY PO LIQD
296.0000 mL | Freq: Once | ORAL | Status: DC
  Filled 2018-10-08: qty 296

## 2018-10-08 MED ORDER — CHLORHEXIDINE GLUCONATE 4 % EX LIQD
60.0000 mL | Freq: Once | CUTANEOUS | Status: AC
  Administered 2018-10-09: 4 via TOPICAL
  Filled 2018-10-08 (×2): qty 60

## 2018-10-08 MED ORDER — INSULIN ASPART 100 UNIT/ML ~~LOC~~ SOLN: 2.0000 [IU] | Freq: Three times a day (TID) | SUBCUTANEOUS | Status: DC

## 2018-10-08 MED ORDER — CEFAZOLIN SODIUM-DEXTROSE 2-4 GM/100ML-% IV SOLN
2.0000 g | INTRAVENOUS | Status: AC
  Administered 2018-10-09: 2 g via INTRAVENOUS
  Filled 2018-10-08: qty 100

## 2018-10-08 NOTE — Progress Notes (Signed)
PROGRESS NOTE    Jon House  ZOX:[id number] DOB: 05-07-1970 DOA: 10/03/2018 PCP: Jon House, Cammie, MD   Brief Narrative:   Jon AppJorge House Renteriais a 48 y.o.malewith medical history significant forpoorly controlled insulin-dependent diabetes mellitus and left great toe osteomyelitis status post amputation in March,now presenting to emergency department for evaluation of worsening swelling, pain, and redness surrounding a wound on the plantar aspect of his left forefoot.   He was admitted for left foot wound and started on broad spectrum antibiotics.  Consulted Dr Lajoyce Cornersuda and MRI of the left foot ordered.   It shows Soft tissue ulceration at the plantar base of the second MTP joint with extension to the joint. Underlying second MTP joint septic arthritis with osteomyelitis of the entire second metatarsal, as well as the lateral cuneiform. Small third MTP joint effusion, potentially reactive, but septic arthritis is not excluded. Reactive osteitis of the third metatarsal. Pathologic fracture of the second metatarsal head and neck.  Assessment & Plan:   Principal Problem:   Diabetic infection of left foot (HCC) Active Problems:   Uncontrolled insulin dependent diabetes mellitus (HCC)   Osteomyelitis of the entire second metatarsal, lateral cuneiform of the left foot: Currently on broad spectrum IV antibiotics.  Dr Lajoyce Cornersuda on board. Discussed the results of the MRI showing  osteomyelitis of the second metatarsal on the left.  Pain control. He will probably amputation of the second ray including amputation of the second toe. Scheduled for Wednesday 10/09/2018. Pt afebrile and leukocytosis resolved.  No new complaints.   Hypokalemia:  Replaced.    Uncontrolled DM with hyperglycemia:  CBG (last 3)  Recent Labs    10/07/18 1623 10/07/18 2211 10/08/18 0645  GLUCAP 99 150* 110*   Much better today.  Add lantus 10 units in the am and 20 units at night ,  A1c is 9.6   Mild  anemia of chronic disease:  Hemoglobin stable around 12.    DVT prophylaxis: lovenox.  Code Status: full code.  Family Communication: none at bedside.  Disposition Plan: pending clinical improvement, and further management by orthopedics.    Consultants:   Orthopedics Dr Lajoyce Cornersuda.   Procedures:  MRI left foot.  Antimicrobials: vancomycin and rocephin, flagyl since admission .    Subjective: Pain better controlled. No new complaints.   Objective: Vitals:   10/07/18 1710 10/07/18 2211 10/08/18 0513 10/08/18 0731  BP: 107/64 95/60 (!) 88/53 92/61  Pulse: 82 86 77 75  Resp: 18 18 19    Temp: 98.5 F (36.9 C) 98.2 F (36.8 C) 98 F (36.7 C)   TempSrc: Oral Oral Oral   SpO2: 96% 98% 98%   Weight:      Height:        Intake/Output Summary (Last 24 hours) at 10/08/2018 0922 Last data filed at 10/08/2018 0907 Gross per 24 hour  Intake 479.73 ml  Output 0 ml  Net 479.73 ml   Filed Weights   10/04/18 0433  Weight: 77.6 kg    Examination:  General exam: calm and comfortable.  Respiratory system: air entry fair, no wheezing or rhonchi.  Cardiovascular system: S1 & S2 heard, RRR. No JVD, murmurs,  Gastrointestinal system: Abdomen is soft , non tender ND BS+ Central nervous system: Alert and oriented. No focal neurological deficits. Extremities: left foot swollen, erythema improved. Swelling improved.  Skin: open wound on the plantar aspect of the left foot.  Psychiatry: Mood & affect appropriate.     Data Reviewed: I have personally reviewed following  labs and imaging studies  CBC: Recent Labs  Lab 10/02/18 1016 10/03/18 1912 10/04/18 0328 10/05/18 1034 10/06/18 0939  WBC 10.9* 12.4* 8.9 6.9 5.5  NEUTROABS  --  11.1* 7.6  --   --   HGB 14.1 13.1 12.6* 12.7* 12.7*  HCT 41.3 38.3* 36.5* 36.4* 36.6*  MCV 88 88.0 86.7 87.1 86.9  PLT 200 194 168 201 221   Basic Metabolic Panel: Recent Labs  Lab 10/03/18 1912 10/04/18 0328 10/05/18 1034 10/06/18 0939  NA 137  135 134* 136  K 3.5 3.3* 4.3 4.3  CL 98 97* 98 99  CO2 31 26 24 28   GLUCOSE 179* 243* 267* 331*  BUN 17 18 17 17   CREATININE 0.87 0.81 0.75 0.68  CALCIUM 9.0 8.6* 8.8* 8.9   GFR: Estimated Creatinine Clearance: 119 mL/min (by C-G formula based on SCr of 0.68 mg/dL). Liver Function Tests: Recent Labs  Lab 10/03/18 1912  AST 15  ALT 15  ALKPHOS 97  BILITOT 0.8  PROT 7.6  ALBUMIN 3.9   No results for input(s): LIPASE, AMYLASE in the last 168 hours. No results for input(s): AMMONIA in the last 168 hours. Coagulation Profile: No results for input(s): INR, PROTIME in the last 168 hours. Cardiac Enzymes: No results for input(s): CKTOTAL, CKMB, CKMBINDEX, TROPONINI in the last 168 hours. BNP (last 3 results) No results for input(s): PROBNP in the last 8760 hours. HbA1C: No results for input(s): HGBA1C in the last 72 hours. CBG: Recent Labs  Lab 10/07/18 0736 10/07/18 1131 10/07/18 1623 10/07/18 2211 10/08/18 0645  GLUCAP 215* 279* 99 150* 110*   Lipid Profile: No results for input(s): CHOL, HDL, LDLCALC, TRIG, CHOLHDL, LDLDIRECT in the last 72 hours. Thyroid Function Tests: No results for input(s): TSH, T4TOTAL, FREET4, T3FREE, THYROIDAB in the last 72 hours. Anemia Panel: No results for input(s): VITAMINB12, FOLATE, FERRITIN, TIBC, IRON, RETICCTPCT in the last 72 hours. Sepsis Labs: Recent Labs  Lab 10/03/18 1912  LATICACIDVEN 1.2    Recent Results (from the past 240 hour(s))  SARS Coronavirus 2 (CEPHEID - Performed in [facility] lab), Hosp Order     Status: None   Collection Time: 10/04/18  1:21 AM  Result Value Ref Range Status   SARS Coronavirus 2 NEGATIVE NEGATIVE Final    Comment: (NOTE) If result is NEGATIVE SARS-CoV-2 target nucleic acids are NOT DETECTED. The SARS-CoV-2 RNA is generally detectable in upper and lower  respiratory specimens during the acute phase of infection. The lowest  concentration of SARS-CoV-2 viral copies this assay can  detect is 250  copies / mL. A negative result does not preclude SARS-CoV-2 infection  and should not be used as the sole basis for treatment or other  patient management decisions.  A negative result may occur with  improper specimen collection / handling, submission of specimen other  than nasopharyngeal swab, presence of viral mutation(s) within the  areas targeted by this assay, and inadequate number of viral copies  (<250 copies / mL). A negative result must be combined with clinical  observations, patient history, and epidemiological information. If result is POSITIVE SARS-CoV-2 target nucleic acids are DETECTED. The SARS-CoV-2 RNA is generally detectable in upper and lower  respiratory specimens dur ing the acute phase of infection.  Positive  results are indicative of active infection with SARS-CoV-2.  Clinical  correlation with patient history and other diagnostic information is  necessary to determine patient infection status.  Positive results do  not rule out bacterial  infection or co-infection with other viruses. If result is PRESUMPTIVE POSTIVE SARS-CoV-2 nucleic acids MAY BE PRESENT.   A presumptive positive result was obtained on the submitted specimen  and confirmed on repeat testing.  While 2019 novel coronavirus  (SARS-CoV-2) nucleic acids may be present in the submitted sample  additional confirmatory testing may be necessary for epidemiological  and / or clinical management purposes  to differentiate between  SARS-CoV-2 and other Sarbecovirus currently known to infect humans.  If clinically indicated additional testing with an alternate test  methodology [phone]) is advised. The SARS-CoV-2 RNA is generally  detectable in upper and lower respiratory sp ecimens during the acute  phase of infection. The expected result is Negative. Fact Sheet for Patients:  BoilerBrush.com.cyhttps://www.fda.gov/media/136312/download Fact Sheet for Healthcare Providers:  https://pope.com/https://www.fda.gov/media/136313/download This test is not yet approved or cleared by the [place] FDA and has been authorized for detection and/or diagnosis of SARS-CoV-2 by FDA under an Emergency Use Authorization (EUA).  This EUA will remain in effect (meaning this test can be used) for the duration of the COVID-19 declaration under Section 564(b)(1) of the Act, 21 U.S.C. section 360bbb-3(b)(1), unless the authorization is terminated or revoked sooner. Performed at [facility], [address] [place], [place] [address]   Blood Cultures x 2 sites     Status: None (Preliminary result)   Collection Time: 10/04/18  1:48 AM  Result Value Ref Range Status   Specimen Description BLOOD LEFT ARM  Final   Special Requests   Final    BOTTLES DRAWN AEROBIC AND ANAEROBIC Blood Culture adequate volume   Culture   Final    NO GROWTH 3 DAYS Performed at [facility], [address] [place], [place] [address]    Report Status PENDING  Incomplete  Blood Cultures x 2 sites     Status: None (Preliminary result)   Collection Time: 10/04/18  3:28 AM  Result Value Ref Range Status   Specimen Description BLOOD LEFT ARM  Final   Special Requests   Final    BOTTLES DRAWN AEROBIC AND ANAEROBIC Blood Culture adequate volume   Culture   Final    NO GROWTH 3 DAYS Performed at [facility], [address] [place], [place] [address]    Report Status PENDING  Incomplete  Surgical pcr screen     Status: None   Collection Time: 10/07/18 12:11 PM  Result Value Ref Range Status   MRSA, PCR NEGATIVE NEGATIVE Final   Staphylococcus aureus NEGATIVE NEGATIVE Final    Comment: (NOTE) The Xpert SA Assay (FDA approved for NASAL specimens in patients 50 years of age and older), is one component of a comprehensive surveillance program. It is not intended to diagnose infection nor to guide or monitor treatment. Performed at [facility], [address] [place], [place] [address]           Radiology Studies: No results found.      Scheduled Meds: . enoxaparin (LOVENOX) injection  40 mg Subcutaneous Q24H  . feeding supplement (GLUCERNA SHAKE)  237 mL Oral BID WC  . insulin aspart  0-15 Units Subcutaneous TID WC  . insulin aspart  0-5 Units Subcutaneous QHS  . insulin aspart  5 Units Subcutaneous TID WC  . insulin glargine  10 Units Subcutaneous Daily  . insulin glargine  20 Units Subcutaneous QHS  . metroNIDAZOLE  500 mg Oral Q8H  . nutrition supplement (JUVEN)  1 packet Oral BID BM  .  sodium chloride flush  3 mL Intravenous Q12H   Continuous Infusions: . sodium chloride 250 mL (10/08/18 9417)  . cefTRIAXone (ROCEPHIN)  IV 2 g (10/08/18 0609)  . vancomycin Stopped (10/08/18 0300)     LOS: 3 days    Time spent: 26 minutes.     Hosie Poisson, MD [facility] Pager [phone]   If 7PM-7AM, please contact night-coverage [email or web address] Password [email or web address] 10/08/2018, 9:22 AM

## 2018-10-09 ENCOUNTER — Ambulatory Visit: Payer: Self-pay | Admitting: Family Medicine

## 2018-10-09 ENCOUNTER — Encounter (HOSPITAL_COMMUNITY): Payer: Self-pay | Admitting: Orthopedic Surgery

## 2018-10-09 ENCOUNTER — Inpatient Hospital Stay (HOSPITAL_COMMUNITY): Payer: Medicaid Other | Admitting: Registered Nurse

## 2018-10-09 ENCOUNTER — Encounter (HOSPITAL_COMMUNITY)
Admission: EM | Disposition: A | Discharge status: Home Health | Payer: Self-pay | Source: Home / Self Care | Attending: Internal Medicine

## 2018-10-09 DIAGNOSIS — M86272 Subacute osteomyelitis, left ankle and foot: Secondary | ICD-10-CM

## 2018-10-09 DIAGNOSIS — M869 Osteomyelitis, unspecified: Secondary | ICD-10-CM

## 2018-10-09 HISTORY — PX: AMPUTATION: SHX166

## 2018-10-09 LAB — CULTURE, BLOOD (ROUTINE X 2)
Culture: NO GROWTH
Culture: NO GROWTH
Special Requests: ADEQUATE
Special Requests: ADEQUATE

## 2018-10-09 LAB — GLUCOSE, CAPILLARY
Glucose-Capillary: 127 mg/dL — ABNORMAL HIGH (ref 70–99)
Glucose-Capillary: 107 mg/dL — ABNORMAL HIGH (ref 70–99)
Glucose-Capillary: 111 mg/dL — ABNORMAL HIGH (ref 70–99)
Glucose-Capillary: 281 mg/dL — ABNORMAL HIGH (ref 70–99)
Glucose-Capillary: 302 mg/dL — ABNORMAL HIGH (ref 70–99)

## 2018-10-09 SURGERY — AMPUTATION DIGIT
Anesthesia: General | Laterality: Left

## 2018-10-09 MED ORDER — MIDAZOLAM HCL 2 MG/2ML IJ SOLN
INTRAMUSCULAR | Status: AC
  Filled 2018-10-09: qty 2

## 2018-10-09 MED ORDER — OXYCODONE HCL 5 MG PO TABS
10.0000 mg | ORAL_TABLET | ORAL | Status: DC | PRN
  Filled 2018-10-09: qty 3

## 2018-10-09 MED ORDER — METHOCARBAMOL 1000 MG/10ML IJ SOLN
500.0000 mg | Freq: Four times a day (QID) | INTRAVENOUS | Status: DC | PRN
  Filled 2018-10-09: qty 5

## 2018-10-09 MED ORDER — BISACODYL 10 MG RE SUPP: 10.0000 mg | Freq: Every day | RECTAL | Status: DC | PRN

## 2018-10-09 MED ORDER — LACTATED RINGERS IV SOLN
INTRAVENOUS | Status: DC
  Administered 2018-10-09: 11:00:00 via INTRAVENOUS

## 2018-10-09 MED ORDER — SODIUM CHLORIDE 0.9 % IV SOLN
INTRAVENOUS | Status: DC
  Administered 2018-10-09: 15:00:00 via INTRAVENOUS

## 2018-10-09 MED ORDER — METOCLOPRAMIDE HCL 5 MG PO TABS: 5.0000 mg | ORAL_TABLET | Freq: Three times a day (TID) | ORAL | Status: DC | PRN

## 2018-10-09 MED ORDER — MAGNESIUM CITRATE PO SOLN: 1.0000 | Freq: Once | ORAL | Status: DC | PRN

## 2018-10-09 MED ORDER — DOCUSATE SODIUM 100 MG PO CAPS
100.0000 mg | ORAL_CAPSULE | Freq: Two times a day (BID) | ORAL | Status: DC
  Administered 2018-10-09 – 2018-10-11 (×5): 100 mg via ORAL
  Filled 2018-10-09 (×5): qty 1

## 2018-10-09 MED ORDER — ACETAMINOPHEN 500 MG PO TABS
1000.0000 mg | ORAL_TABLET | Freq: Once | ORAL | Status: AC
  Administered 2018-10-09: 1000 mg via ORAL

## 2018-10-09 MED ORDER — METOCLOPRAMIDE HCL 5 MG/ML IJ SOLN: 5.0000 mg | Freq: Three times a day (TID) | INTRAMUSCULAR | Status: DC | PRN

## 2018-10-09 MED ORDER — LACTATED RINGERS IV SOLN
INTRAVENOUS | Status: DC | PRN
  Administered 2018-10-09: 12:00:00 via INTRAVENOUS

## 2018-10-09 MED ORDER — OXYCODONE HCL 5 MG PO TABS
5.0000 mg | ORAL_TABLET | ORAL | Status: DC | PRN
  Administered 2018-10-10: 10 mg via ORAL
  Administered 2018-10-11: 5 mg via ORAL
  Filled 2018-10-09 (×2): qty 2

## 2018-10-09 MED ORDER — MIDAZOLAM HCL 5 MG/5ML IJ SOLN
INTRAMUSCULAR | Status: DC | PRN
  Administered 2018-10-09: 2 mg via INTRAVENOUS

## 2018-10-09 MED ORDER — ONDANSETRON HCL 4 MG/2ML IJ SOLN
INTRAMUSCULAR | Status: DC | PRN
  Administered 2018-10-09: 4 mg via INTRAVENOUS

## 2018-10-09 MED ORDER — ONDANSETRON HCL 4 MG/2ML IJ SOLN: 4.0000 mg | Freq: Four times a day (QID) | INTRAMUSCULAR | Status: DC | PRN

## 2018-10-09 MED ORDER — FENTANYL CITRATE (PF) 250 MCG/5ML IJ SOLN
INTRAMUSCULAR | Status: DC | PRN
  Administered 2018-10-09: 100 ug via INTRAVENOUS

## 2018-10-09 MED ORDER — HYDROMORPHONE HCL 1 MG/ML IJ SOLN: 0.5000 mg | INTRAMUSCULAR | Status: DC | PRN

## 2018-10-09 MED ORDER — PROPOFOL 10 MG/ML IV BOLUS
INTRAVENOUS | Status: AC
  Filled 2018-10-09: qty 20

## 2018-10-09 MED ORDER — 0.9 % SODIUM CHLORIDE (POUR BTL) OPTIME
TOPICAL | Status: DC | PRN
  Administered 2018-10-09: 1000 mL

## 2018-10-09 MED ORDER — FENTANYL CITRATE (PF) 100 MCG/2ML IJ SOLN: 25.0000 ug | INTRAMUSCULAR | Status: DC | PRN

## 2018-10-09 MED ORDER — DEXAMETHASONE SODIUM PHOSPHATE 10 MG/ML IJ SOLN
INTRAMUSCULAR | Status: DC | PRN
  Administered 2018-10-09: 10 mg via INTRAVENOUS

## 2018-10-09 MED ORDER — ACETAMINOPHEN 500 MG PO TABS
ORAL_TABLET | ORAL | Status: AC
  Filled 2018-10-09: qty 2

## 2018-10-09 MED ORDER — LIDOCAINE 2% (20 MG/ML) 5 ML SYRINGE
INTRAMUSCULAR | Status: AC
  Filled 2018-10-09: qty 5

## 2018-10-09 MED ORDER — LIDOCAINE 2% (20 MG/ML) 5 ML SYRINGE
INTRAMUSCULAR | Status: DC | PRN
  Administered 2018-10-09: 60 mg via INTRAVENOUS

## 2018-10-09 MED ORDER — EPHEDRINE SULFATE 50 MG/ML IJ SOLN
INTRAMUSCULAR | Status: DC | PRN
  Administered 2018-10-09: 10 mg via INTRAVENOUS
  Administered 2018-10-09: 5 mg via INTRAVENOUS

## 2018-10-09 MED ORDER — ACETAMINOPHEN 325 MG PO TABS: 325.0000 mg | ORAL_TABLET | Freq: Four times a day (QID) | ORAL | Status: DC | PRN

## 2018-10-09 MED ORDER — ONDANSETRON HCL 4 MG PO TABS: 4.0000 mg | ORAL_TABLET | Freq: Four times a day (QID) | ORAL | Status: DC | PRN

## 2018-10-09 MED ORDER — METHOCARBAMOL 500 MG PO TABS
500.0000 mg | ORAL_TABLET | Freq: Four times a day (QID) | ORAL | Status: DC | PRN
  Administered 2018-10-10 – 2018-10-11 (×2): 500 mg via ORAL
  Filled 2018-10-09 (×2): qty 1

## 2018-10-09 MED ORDER — POLYETHYLENE GLYCOL 3350 17 G PO PACK: 17.0000 g | PACK | Freq: Every day | ORAL | Status: DC | PRN

## 2018-10-09 MED ORDER — PHENYLEPHRINE HCL (PRESSORS) 10 MG/ML IV SOLN
INTRAVENOUS | Status: DC | PRN
  Administered 2018-10-09 (×4): 80 ug via INTRAVENOUS

## 2018-10-09 MED ORDER — FENTANYL CITRATE (PF) 250 MCG/5ML IJ SOLN
INTRAMUSCULAR | Status: AC
  Filled 2018-10-09: qty 5

## 2018-10-09 MED ORDER — PROPOFOL 10 MG/ML IV BOLUS
INTRAVENOUS | Status: DC | PRN
  Administered 2018-10-09: 150 mg via INTRAVENOUS

## 2018-10-09 SURGICAL SUPPLY — 33 items
BLADE SURG 21 STRL SS (BLADE) ×3 IMPLANT
BNDG COHESIVE 4X5 TAN STRL (GAUZE/BANDAGES/DRESSINGS) ×3 IMPLANT
BNDG ESMARK 4X9 LF (GAUZE/BANDAGES/DRESSINGS) IMPLANT
BNDG GAUZE ELAST 4 BULKY (GAUZE/BANDAGES/DRESSINGS) ×3 IMPLANT
COVER SURGICAL LIGHT HANDLE (MISCELLANEOUS) ×4 IMPLANT
COVER WAND RF STERILE (DRAPES) ×3 IMPLANT
DRAPE U-SHAPE 47X51 STRL (DRAPES) ×3 IMPLANT
DRSG ADAPTIC 3X8 NADH LF (GAUZE/BANDAGES/DRESSINGS) ×2 IMPLANT
DRSG PAD ABDOMINAL 8X10 ST (GAUZE/BANDAGES/DRESSINGS) ×3 IMPLANT
DURAPREP 26ML APPLICATOR (WOUND CARE) ×3 IMPLANT
ELECT REM PT RETURN 9FT ADLT (ELECTROSURGICAL) ×3
ELECTRODE REM PT RTRN 9FT ADLT (ELECTROSURGICAL) ×1 IMPLANT
GAUZE SPONGE 4X4 12PLY STRL (GAUZE/BANDAGES/DRESSINGS) IMPLANT
GAUZE SPONGE 4X4 12PLY STRL LF (GAUZE/BANDAGES/DRESSINGS) ×2 IMPLANT
GLOVE BIOGEL PI IND STRL 9 (GLOVE) ×1 IMPLANT
GLOVE BIOGEL PI INDICATOR 9 (GLOVE) ×2
GLOVE SURG ORTHO 9.0 STRL STRW (GLOVE) ×3 IMPLANT
GOWN STRL REUS W/ TWL LRG LVL3 (GOWN DISPOSABLE) IMPLANT
GOWN STRL REUS W/ TWL XL LVL3 (GOWN DISPOSABLE) ×2 IMPLANT
GOWN STRL REUS W/TWL LRG LVL3 (GOWN DISPOSABLE) ×4
GOWN STRL REUS W/TWL XL LVL3 (GOWN DISPOSABLE) ×4
KIT BASIN OR (CUSTOM PROCEDURE TRAY) ×3 IMPLANT
KIT TURNOVER KIT B (KITS) ×3 IMPLANT
MANIFOLD NEPTUNE II (INSTRUMENTS) ×3 IMPLANT
NEEDLE 22X1 1/2 (OR ONLY) (NEEDLE) IMPLANT
NS IRRIG 1000ML POUR BTL (IV SOLUTION) ×3 IMPLANT
PACK ORTHO EXTREMITY (CUSTOM PROCEDURE TRAY) ×3 IMPLANT
PAD ABD 8X10 STRL (GAUZE/BANDAGES/DRESSINGS) ×2 IMPLANT
PAD ARMBOARD 7.5X6 YLW CONV (MISCELLANEOUS) ×2 IMPLANT
SPONGE LAP 18X18 X RAY DECT (DISPOSABLE) ×2 IMPLANT
SUT ETHILON 2 0 PSLX (SUTURE) ×5 IMPLANT
SYR CONTROL 10ML LL (SYRINGE) IMPLANT
TOWEL OR 17X26 10 PK STRL BLUE (TOWEL DISPOSABLE) ×3 IMPLANT

## 2018-10-09 NOTE — Op Note (Signed)
10/09/2018  12:55 PM  PATIENT:  Jon House    PRE-OPERATIVE DIAGNOSIS:  Osteomyelitis Left Foot, second metatarsal with surrounding abscess  POST-OPERATIVE DIAGNOSIS:  Same  PROCEDURE:  LEFT FOOT 2ND ray amputation with local tissue rearrangement wound closure 3 x 7 cm  SURGEON:  Newt Minion, MD  PHYSICIAN ASSISTANT:None ANESTHESIA:   General  PREOPERATIVE INDICATIONS:  Jon House is a  49 y.o. male with a diagnosis of Osteomyelitis Left Foot who failed conservative measures and elected for surgical management.    The risks benefits and alternatives were discussed with the patient preoperatively including but not limited to the risks of infection, bleeding, nerve injury, cardiopulmonary complications, the need for revision surgery, among others, and the patient was willing to proceed.  OPERATIVE IMPLANTS: None.  @ENCIMAGES @  OPERATIVE FINDINGS: The infection was confined to the second ray this did not extend to the third toe or third ray.  OPERATIVE PROCEDURE: Patient was brought to the operating room and underwent a general anesthetic.  After adequate levels anesthesia were obtained patient's left lower extremity was prepped using DuraPrep and draped into a sterile field a timeout was called.  AV incision was made around the plantar ulcer in the second ray.  The second ray and ulcerative tissue as well as tissue involved with the abscess were resected in one block of tissue through the base of the second metatarsal.  Electrocautery was used for hemostasis the wound was irrigated with normal saline there is no evidence of involvement of the third toe or third ray with the infection.  Local tissue rearrangement was used to close the wound 3 x 7 cm.  A sterile dressing was applied patient was extubated taken the PACU in stable condition.   DISCHARGE PLANNING:  Antibiotic duration: Continue antibiotics 24 hours postoperatively  Weightbearing: Ideally  nonweightbearing on the left  Pain medication: Opioid pathway  Dressing care/ Wound VAC: Dry dressing change in 1 week  Ambulatory devices: Walker or crutches   Discharge to: Anticipate discharge to home.  Follow-up: In the office 1 week post operative.

## 2018-10-09 NOTE — Progress Notes (Signed)
PROGRESS NOTE    Jon House  WUJ:[id number] DOB: 1970/06/11 DOA: 10/03/2018 PCP: Cain SaupeFulp, Cammie, MD   Brief Narrative: Jon House is a 48 y.o. male with a history of poorly controlled diabetes, osteomyelitis status post left first digit amputation.  Patient presented secondary to worsening left foot wound concerning for osteomyelitis.  He is been started on empiric antibiotics and there is a plan for second ray amputation   Assessment & Plan:   Principal Problem:   Diabetic infection of left foot (HCC) Active Problems:   Uncontrolled insulin dependent diabetes mellitus (HCC)   Diabetic foot infection Osteomyelitis Patient with evidence of left second metatarsal osteomyelitis.  Empirically started on vancomycin, metronidazole and ceftriaxone.  Patient is afebrile.  No leukocytosis.  Orthopedic surgery is consulted and spine for secondary amputation of left foot.  Blood cultures are no growth to date. -Continue broad-spectrum antibiotics for now. -Orthopedic surgery recommendations: Surgery pending today  Diabetes mellitus, type II Patient with uncontrolled diabetes on insulin therapy.  Blood sugars better controlled with increase of Lantus.  Hemoglobin A1c of 9.6% from 10/02/2018 -Continue Lantus 30 units daily (10 units in the morning and 20 units at bedtime) and sliding scale insulin   DVT prophylaxis: Lovenox Code Status:   Code Status: Full Code Family Communication: None at bedside Disposition Plan: Home pending orthopedic surgery recommendations/management in addition to transition off of IV antibiotics   Consultants:   Orthopedic surgery  Procedures:   None  Antimicrobials:  Vancomycin  Metronidazole  Ceftriaxone   Subjective: Interpreter: Mathis Farelbert [phone]  Patient without any pain while at rest.  He does have pain when ambulating.  No issues overnight.  Objective: Vitals:   10/08/18 1826 10/08/18 2059 10/09/18 0300 10/09/18 0349  BP: (!)  91/54 100/63  (!) 90/57  Pulse: 81 79  81  Resp:  16  14  Temp: 97.9 F (36.6 C) 97.8 F (36.6 C)  98.4 F (36.9 C)  TempSrc: Oral Oral  Oral  SpO2: 95% 98%  97%  Weight:   74.9 kg   Height:        Intake/Output Summary (Last 24 hours) at 10/09/2018 0729 Last data filed at 10/09/2018 0200 Gross per 24 hour  Intake 1296.99 ml  Output 300 ml  Net 996.99 ml   Filed Weights   10/04/18 0433 10/09/18 0300  Weight: 77.6 kg 74.9 kg    Examination:  General exam: Appears calm and comfortable Respiratory system: Clear to auscultation. Respiratory effort normal. Cardiovascular system: S1 & S2 heard, RRR. No murmurs, rubs, gallops or clicks. Gastrointestinal system: Abdomen is nondistended, soft and nontender. No organomegaly or masses felt. Normal bowel sounds heard. Central nervous system: Alert and oriented. No focal neurological deficits. Extremities: No edema. No calf tenderness.  Left first toe is amputated.  Left foot appears swollen with no erythema Skin: No cyanosis. No rashes Psychiatry: Judgement and insight appear normal. Mood & affect appropriate.     Data Reviewed: I have personally reviewed following labs and imaging studies  CBC: Recent Labs  Lab 10/03/18 1912 10/04/18 0328 10/05/18 1034 10/06/18 0939 10/08/18 0946  WBC 12.4* 8.9 6.9 5.5 5.2  NEUTROABS 11.1* 7.6  --   --   --   HGB 13.1 12.6* 12.7* 12.7* 12.5*  HCT 38.3* 36.5* 36.4* 36.6* 36.7*  MCV 88.0 86.7 87.1 86.9 87.0  PLT 194 168 201 221 272   Basic Metabolic Panel: Recent Labs  Lab 10/03/18 1912 10/04/18 0328 10/05/18 1034 10/06/18 0939 10/08/18  0946  NA 137 135 134* 136 137  K 3.5 3.3* 4.3 4.3 3.9  CL 98 97* 98 99 99  CO2 31 26 24 28 29   GLUCOSE 179* 243* 267* 331* 241*  BUN 17 18 17 17 16   CREATININE 0.87 0.81 0.75 0.68 0.72  CALCIUM 9.0 8.6* 8.8* 8.9 8.8*   GFR: Estimated Creatinine Clearance: 118.3 mL/min (by C-G formula based on SCr of 0.72 mg/dL). Liver Function Tests: Recent  Labs  Lab 10/03/18 1912  AST 15  ALT 15  ALKPHOS 97  BILITOT 0.8  PROT 7.6  ALBUMIN 3.9   No results for input(s): LIPASE, AMYLASE in the last 168 hours. No results for input(s): AMMONIA in the last 168 hours. Coagulation Profile: No results for input(s): INR, PROTIME in the last 168 hours. Cardiac Enzymes: No results for input(s): CKTOTAL, CKMB, CKMBINDEX, TROPONINI in the last 168 hours. BNP (last 3 results) No results for input(s): PROBNP in the last 8760 hours. HbA1C: No results for input(s): HGBA1C in the last 72 hours. CBG: Recent Labs  Lab 10/08/18 0645 10/08/18 1117 10/08/18 1624 10/08/18 2100 10/09/18 0648  GLUCAP 110* 207* 127* 175* 127*   Lipid Profile: No results for input(s): CHOL, HDL, LDLCALC, TRIG, CHOLHDL, LDLDIRECT in the last 72 hours. Thyroid Function Tests: No results for input(s): TSH, T4TOTAL, FREET4, T3FREE, THYROIDAB in the last 72 hours. Anemia Panel: No results for input(s): VITAMINB12, FOLATE, FERRITIN, TIBC, IRON, RETICCTPCT in the last 72 hours. Sepsis Labs: Recent Labs  Lab 10/03/18 1912  LATICACIDVEN 1.2    Recent Results (from the past 240 hour(s))  SARS Coronavirus 2 (CEPHEID - Performed in [facility]), Hosp Order     Status: None   Collection Time: 10/04/18  1:21 AM  Result Value Ref Range Status   SARS Coronavirus 2 NEGATIVE NEGATIVE Final    Comment: (NOTE) If result is NEGATIVE SARS-CoV-2 target nucleic acids are NOT DETECTED. The SARS-CoV-2 RNA is generally detectable in upper and lower  respiratory specimens during the acute phase of infection. The lowest  concentration of SARS-CoV-2 viral copies this assay can detect is 250  copies / mL. A negative result does not preclude SARS-CoV-2 infection  and should not be used as the sole basis for treatment or other  patient management decisions.  A negative result may occur with  improper specimen collection / handling, submission of specimen other  than  nasopharyngeal swab, presence of viral mutation(s) within the  areas targeted by this assay, and inadequate number of viral copies  (<250 copies / mL). A negative result must be combined with clinical  observations, patient history, and epidemiological information. If result is POSITIVE SARS-CoV-2 target nucleic acids are DETECTED. The SARS-CoV-2 RNA is generally detectable in upper and lower  respiratory specimens dur ing the acute phase of infection.  Positive  results are indicative of active infection with SARS-CoV-2.  Clinical  correlation with patient history and other diagnostic information is  necessary to determine patient infection status.  Positive results do  not rule out bacterial infection or co-infection with other viruses. If result is PRESUMPTIVE POSTIVE SARS-CoV-2 nucleic acids MAY BE PRESENT.   A presumptive positive result was obtained on the submitted specimen  and confirmed on repeat testing.  While 2019 novel coronavirus  (SARS-CoV-2) nucleic acids may be present in the submitted sample  additional confirmatory testing may be necessary for epidemiological  and / or clinical management purposes  to differentiate between  SARS-CoV-2 and other Sarbecovirus  currently known to infect humans.  If clinically indicated additional testing with an alternate test  methodology [id number]) is advised. The SARS-CoV-2 RNA is generally  detectable in upper and lower respiratory sp ecimens during the acute  phase of infection. The expected result is Negative. Fact Sheet for Patients:  StrictlyIdeas.no Fact Sheet for Healthcare Providers: BankingDealers.co.za This test is not yet approved or cleared by the [place] FDA and has been authorized for detection and/or diagnosis of SARS-CoV-2 by FDA under an Emergency Use Authorization (EUA).  This EUA will remain in effect (meaning this test can be used) for the duration of the  COVID-19 declaration under Section 564(b)(1) of the Act, 21 U.S.C. section 360bbb-3(b)(1), unless the authorization is terminated or revoked sooner. Performed at [facility], [place] [address], [place], [place] [address]   Blood Cultures x 2 sites     Status: None (Preliminary result)   Collection Time: 10/04/18  1:48 AM  Result Value Ref Range Status   Specimen Description BLOOD LEFT ARM  Final   Special Requests   Final    BOTTLES DRAWN AEROBIC AND ANAEROBIC Blood Culture adequate volume   Culture   Final    NO GROWTH 4 DAYS Performed at [facility], [place] [address], [place], [place] [address]    Report Status PENDING  Incomplete  Blood Cultures x 2 sites     Status: None (Preliminary result)   Collection Time: 10/04/18  3:28 AM  Result Value Ref Range Status   Specimen Description BLOOD LEFT ARM  Final   Special Requests   Final    BOTTLES DRAWN AEROBIC AND ANAEROBIC Blood Culture adequate volume   Culture   Final    NO GROWTH 4 DAYS Performed at [facility], [place] [address], [place], [place] [address]    Report Status PENDING  Incomplete  Surgical pcr screen     Status: None   Collection Time: 10/07/18 12:11 PM  Result Value Ref Range Status   MRSA, PCR NEGATIVE NEGATIVE Final   Staphylococcus aureus NEGATIVE NEGATIVE Final    Comment: (NOTE) The Xpert SA Assay (FDA approved for NASAL specimens in patients 47 years of age and older), is one component of a comprehensive surveillance program. It is not intended to diagnose infection nor to guide or monitor treatment. Performed at [facility], [place] [address], [place], [place] [address]          Radiology Studies: No results found.      Scheduled Meds: . enoxaparin (LOVENOX) injection  40 mg Subcutaneous Q24H  . feeding supplement  296 mL Oral Once  . feeding supplement (GLUCERNA SHAKE)  237 mL Oral BID WC  . insulin aspart  0-15 Units Subcutaneous TID WC  . insulin aspart  0-5 Units  Subcutaneous QHS  . insulin glargine  10 Units Subcutaneous Daily  . insulin glargine  20 Units Subcutaneous QHS  . metroNIDAZOLE  500 mg Oral Q8H  . nutrition supplement (JUVEN)  1 packet Oral BID BM  . povidone-iodine  2 application Topical Once  . sodium chloride flush  3 mL Intravenous Q12H   Continuous Infusions: . sodium chloride 250 mL (10/08/18 2952)  .  ceFAZolin (ANCEF) IV    . cefTRIAXone (ROCEPHIN)  IV 2 g (10/09/18 0559)  . vancomycin 1,250 mg (10/08/18 2330)     LOS: 4 days     Cordelia Poche, MD [facility] 10/09/2018, 7:29 AM  If 7PM-7AM, please  contact night-coverage [email or web address]

## 2018-10-09 NOTE — Progress Notes (Signed)
Orthopedic Tech Progress Note Patient Details:  Aundra Espin 05-31-1970 [id number]  Ortho Devices Type of Ortho Device: Postop shoe/boot Ortho Device/Splint Location: LLE Ortho Device/Splint Interventions: Adjustment, Application, Ordered   Post Interventions Patient Tolerated: Well Instructions Provided: Care of device, Adjustment of device   Janit Pagan 10/09/2018, 4:27 PM

## 2018-10-09 NOTE — Interval H&P Note (Signed)
History and Physical Interval Note:  10/09/2018 7:01 AM  Jon House  has presented today for surgery, with the diagnosis of Osteomyelitis Left Foot.  The various methods of treatment have been discussed with the patient and family. After consideration of risks, benefits and other options for treatment, the patient has consented to  Procedure(s): LEFT FOOT 2ND TOE AND POSSIBLE 3RD TOE AMPUTATION (Left) as a surgical intervention.  The patient's history has been reviewed, patient examined, no change in status, stable for surgery.  I have reviewed the patient's chart and labs.  Questions were answered to the patient's satisfaction.     Newt Minion

## 2018-10-09 NOTE — Anesthesia Procedure Notes (Signed)
Procedure Name: LMA Insertion Date/Time: 10/09/2018 12:24 PM Performed by: Jearld Pies, CRNA Pre-anesthesia Checklist: Patient identified, Emergency Drugs available, Suction available and Patient being monitored Patient Re-evaluated:Patient Re-evaluated prior to induction Oxygen Delivery Method: Circle System Utilized Preoxygenation: Pre-oxygenation with 100% oxygen Induction Type: IV induction Ventilation: Mask ventilation without difficulty LMA: LMA inserted LMA Size: 4.0 Number of attempts: 1 Airway Equipment and Method: Bite block Placement Confirmation: positive ETCO2 Tube secured with: Tape Dental Injury: Teeth and Oropharynx as per pre-operative assessment

## 2018-10-09 NOTE — Transfer of Care (Signed)
Immediate Anesthesia Transfer of Care Note  Patient: Jon House  Procedure(s) Performed: LEFT FOOT 2ND TOE AND POSSIBLE 3RD TOE AMPUTATION (Left )  Patient Location: PACU  Anesthesia Type:General  Level of Consciousness: awake, alert  and oriented  Airway & Oxygen Therapy: Patient Spontanous Breathing and Patient connected to face mask oxygen  Post-op Assessment: Report given to RN and Post -op Vital signs reviewed and stable  Post vital signs: Reviewed and stable  Last Vitals:  Vitals Value Taken Time  BP 105/66   Temp    Pulse 95 10/09/2018  1:03 PM  Resp 16 10/09/2018  1:03 PM  SpO2 98 % 10/09/2018  1:03 PM  Vitals shown include unvalidated device data.  Last Pain:  Vitals:   10/09/18 1011  TempSrc: Oral  PainSc:          Complications: No apparent anesthesia complications

## 2018-10-09 NOTE — Anesthesia Preprocedure Evaluation (Addendum)
Anesthesia Evaluation  Patient identified by MRN, date of birth, ID band Patient awake    Reviewed: Allergy & Precautions, NPO status , Patient's Chart, lab work & pertinent test results  Airway Mallampati: I  TM Distance: >3 FB Neck ROM: Full    Dental no notable dental hx. (+) Teeth Intact, Dental Advisory Given,    Pulmonary neg pulmonary ROS, former smoker,    Pulmonary exam normal breath sounds clear to auscultation       Cardiovascular negative cardio ROS Normal cardiovascular exam Rhythm:Regular Rate:Normal     Neuro/Psych negative neurological ROS  negative psych ROS   GI/Hepatic negative GI ROS, Neg liver ROS,   Endo/Other  negative endocrine ROSdiabetes, Poorly Controlled, Insulin Dependent  Renal/GU negative Renal ROS  negative genitourinary   Musculoskeletal   Abdominal   Peds  Hematology negative hematology ROS (+)   Anesthesia Other Findings   Reproductive/Obstetrics negative OB ROS                            Anesthesia Physical Anesthesia Plan  ASA: III  Anesthesia Plan: General   Post-op Pain Management:    Induction: Intravenous  PONV Risk Score and Plan: 2 and Midazolam, Dexamethasone and Ondansetron  Airway Management Planned: LMA  Additional Equipment:   Intra-op Plan:   Post-operative Plan: Extubation in OR  Informed Consent: I have reviewed the patients History and Physical, chart, labs and discussed the procedure including the risks, benefits and alternatives for the proposed anesthesia with the patient or authorized representative who has indicated his/her understanding and acceptance.     Dental advisory given  Plan Discussed with: CRNA  Anesthesia Plan Comments:         Anesthesia Quick Evaluation

## 2018-10-10 ENCOUNTER — Encounter (HOSPITAL_COMMUNITY): Payer: Self-pay | Admitting: Orthopedic Surgery

## 2018-10-10 LAB — BASIC METABOLIC PANEL
Anion gap: 8 (ref 5–15)
BUN: 18 mg/dL (ref 6–20)
CO2: 26 mmol/L (ref 22–32)
Calcium: 8.4 mg/dL — ABNORMAL LOW (ref 8.9–10.3)
Chloride: 104 mmol/L (ref 98–111)
Creatinine, Ser: 0.65 mg/dL (ref 0.61–1.24)
GFR calc Af Amer: >60 mL/min (ref >60)
GFR calc non Af Amer: >60 mL/min (ref >60)
Glucose, Bld: 198 mg/dL — ABNORMAL HIGH (ref 70–99)
Potassium: 3.7 mmol/L (ref 3.5–5.1)
Sodium: 138 mmol/L (ref 135–145)

## 2018-10-10 LAB — GLUCOSE, CAPILLARY
Glucose-Capillary: 170 mg/dL — ABNORMAL HIGH (ref 70–99)
Glucose-Capillary: 252 mg/dL — ABNORMAL HIGH (ref 70–99)
Glucose-Capillary: 157 mg/dL — ABNORMAL HIGH (ref 70–99)
Glucose-Capillary: 199 mg/dL — ABNORMAL HIGH (ref 70–99)

## 2018-10-10 LAB — CBC
HCT: 32.7 % — ABNORMAL LOW (ref 39.0–52.0)
Hemoglobin: 11.2 g/dL — ABNORMAL LOW (ref 13.0–17.0)
MCH: 29.9 pg (ref 26.0–34.0)
MCHC: 34.3 g/dL (ref 30.0–36.0)
MCV: 87.4 fL (ref 80.0–100.0)
Platelets: 305 10*3/uL (ref 150–400)
RBC: 3.74 MIL/uL — ABNORMAL LOW (ref 4.22–5.81)
RDW: 11.9 % (ref 11.5–15.5)
WBC: 9.5 10*3/uL (ref 4.0–10.5)
nRBC: 0 % (ref 0.0–0.2)

## 2018-10-10 NOTE — Progress Notes (Signed)
Pharmacy Antibiotic Note  Jon House is a 48 y.o. male admitted on 10/03/2018 with diabetic foot wound.    Patient s/p amputation with ortho recommendation to discontinue abx after 24 hours post op.  Scr stable, WBC stable  Plan: Vancomycin 1250 mg IV q12h >>Esimated AUC: 468  Ceftriaxone/Flagyl per MD -Please consider d/c'ing abx this evening per ortho recs   Temp (24hrs), Avg:97.8 F (36.6 C), Min:97 F (36.1 C), Max:98.1 F (36.7 C)  Recent Labs  Lab 10/03/18 1912 10/04/18 0328 10/05/18 1034 10/06/18 0939 10/08/18 0946 10/10/18 0648  WBC 12.4* 8.9 6.9 5.5 5.2 9.5  CREATININE 0.87 0.81 0.75 0.68 0.72 0.65  LATICACIDVEN 1.2  --   --   --   --   --     Estimated Creatinine Clearance: 118.3 mL/min (by C-G formula based on SCr of 0.65 mg/dL).    Allergies  Allergen Reactions  . Bee Venom Hives   Manasvi Dickard A. Levada Dy, PharmD, [place] Please utilize Amion for appropriate phone number to reach the unit pharmacist ([place])   10/10/2018 10:00 AM

## 2018-10-10 NOTE — Anesthesia Postprocedure Evaluation (Signed)
Anesthesia Post Note  Patient: Jon House  Procedure(s) Performed: LEFT FOOT 2ND TOE AND POSSIBLE 3RD TOE AMPUTATION (Left )     Patient location during evaluation: PACU Anesthesia Type: General Level of consciousness: awake and alert Pain management: pain level controlled Vital Signs Assessment: post-procedure vital signs reviewed and stable Respiratory status: spontaneous breathing, nonlabored ventilation, respiratory function stable and patient connected to nasal cannula oxygen Cardiovascular status: blood pressure returned to baseline and stable Postop Assessment: no apparent nausea or vomiting Anesthetic complications: no    Last Vitals:  Vitals:   10/09/18 1649 10/10/18 0458  BP: (!) 102/56 98/65  Pulse: 92 81  Resp: 18 18  Temp: 36.7 C 36.7 C  SpO2: 95% 98%    Last Pain:  Vitals:   10/10/18 0846  TempSrc:   PainSc: 0-No pain   Pain Goal:                   Sibyl Mikula L Naseer Hearn

## 2018-10-10 NOTE — Progress Notes (Signed)
PROGRESS NOTE    Jon House  UEA:[id number] DOB: 21-May-1970 DOA: 10/03/2018 PCP: Cain SaupeFulp, Cammie, MD   Brief Narrative: Jon House is a 48 y.o. male with a history of poorly controlled diabetes, osteomyelitis status post left first digit amputation.  Patient presented secondary to worsening left foot wound concerning for osteomyelitis.  He is been started on empiric antibiotics and there is a plan for second ray amputation   Assessment & Plan:   Principal Problem:   Diabetic infection of left foot (HCC) Active Problems:   Uncontrolled insulin dependent diabetes mellitus (HCC)   Subacute osteomyelitis, left ankle and foot (HCC)   Diabetic foot infection Osteomyelitis Patient with evidence of left second metatarsal osteomyelitis.  Empirically started on vancomycin, metronidazole and ceftriaxone.  Patient is afebrile.  No leukocytosis.  Orthopedic surgery is consulted and spine for secondary amputation of left foot.  Blood cultures are no growth to date. S/p 2nd ray amputation on 6/10. -Continue broad-spectrum antibiotics for toiday -Orthopedic surgery recommendations: NWB left foot, Dry dressing w/ change in 1 week  Diabetes mellitus, type II Patient with uncontrolled diabetes on insulin therapy.  Blood sugars better controlled with increase of Lantus.  Hemoglobin A1c of 9.6% from 10/02/2018. Spike in blood sugar yesterday, but appears patient got a Decadron injection, which may be affecting blood sugar. -Continue Lantus 30 units daily (10 units in the morning and 20 units at bedtime) and sliding scale insulin   DVT prophylaxis: Lovenox Code Status:   Code Status: Full Code Family Communication: None at bedside Disposition Plan: Home likely in 24 hours pending IV antibiotics, PT eval   Consultants:   Orthopedic surgery  Procedures:   None  Antimicrobials:  Vancomycin  Metronidazole  Ceftriaxone   Subjective: InterpreterElita Quick: Jose [phone]  No concerns  today.  Objective: Vitals:   10/09/18 1427 10/09/18 1649 10/10/18 0458 10/10/18 1006  BP: 114/71 (!) 102/56 98/65 103/60  Pulse: 88 92 81 81  Resp:  18 18 16   Temp:  98 F (36.7 C) 98 F (36.7 C) 98.2 F (36.8 C)  TempSrc:  Oral Oral Oral  SpO2: 96% 95% 98% 96%  Weight:      Height:        Intake/Output Summary (Last 24 hours) at 10/10/2018 1047 Last data filed at 10/10/2018 1006 Gross per 24 hour  Intake 2108.63 ml  Output 2025 ml  Net 83.63 ml   Filed Weights   10/04/18 0433 10/09/18 0300 10/09/18 1113  Weight: 77.6 kg 74.9 kg 74.9 kg    Examination:  General exam: Appears calm and comfortable Respiratory system: Clear to auscultation. Respiratory effort normal. Cardiovascular system: S1 & S2 heard, RRR. No murmurs, rubs, gallops or clicks. Gastrointestinal system: Abdomen is nondistended, soft and nontender. No organomegaly or masses felt. Normal bowel sounds heard. Central nervous system: Alert and oriented. No focal neurological deficits. Extremities: No edema. No calf tenderness. Left foot with dressing. Skin: No cyanosis. No rashes Psychiatry: Judgement and insight appear normal. Mood & affect appropriate.     Data Reviewed: I have personally reviewed following labs and imaging studies  CBC: Recent Labs  Lab 10/03/18 1912 10/04/18 0328 10/05/18 1034 10/06/18 0939 10/08/18 0946 10/10/18 0648  WBC 12.4* 8.9 6.9 5.5 5.2 9.5  NEUTROABS 11.1* 7.6  --   --   --   --   HGB 13.1 12.6* 12.7* 12.7* 12.5* 11.2*  HCT 38.3* 36.5* 36.4* 36.6* 36.7* 32.7*  MCV 88.0 86.7 87.1 86.9 87.0 87.4  PLT  194 168 201 221 272 409   Basic Metabolic Panel: Recent Labs  Lab 10/04/18 0328 10/05/18 1034 10/06/18 0939 10/08/18 0946 10/10/18 0648  NA 135 134* 136 137 138  K 3.3* 4.3 4.3 3.9 3.7  CL 97* 98 99 99 104  CO2 26 24 28 29 26   GLUCOSE 243* 267* 331* 241* 198*  BUN 18 17 17 16 18   CREATININE 0.81 0.75 0.68 0.72 0.65  CALCIUM 8.6* 8.8* 8.9 8.8* 8.4*   GFR:  Estimated Creatinine Clearance: 118.3 mL/min (by C-G formula based on SCr of 0.65 mg/dL). Liver Function Tests: Recent Labs  Lab 10/03/18 1912  AST 15  ALT 15  ALKPHOS 97  BILITOT 0.8  PROT 7.6  ALBUMIN 3.9   No results for input(s): LIPASE, AMYLASE in the last 168 hours. No results for input(s): AMMONIA in the last 168 hours. Coagulation Profile: No results for input(s): INR, PROTIME in the last 168 hours. Cardiac Enzymes: No results for input(s): CKTOTAL, CKMB, CKMBINDEX, TROPONINI in the last 168 hours. BNP (last 3 results) No results for input(s): PROBNP in the last 8760 hours. HbA1C: No results for input(s): HGBA1C in the last 72 hours. CBG: Recent Labs  Lab 10/09/18 1056 10/09/18 1310 10/09/18 1650 10/09/18 2111 10/10/18 0656  GLUCAP 107* 111* 281* 302* 170*   Lipid Profile: No results for input(s): CHOL, HDL, LDLCALC, TRIG, CHOLHDL, LDLDIRECT in the last 72 hours. Thyroid Function Tests: No results for input(s): TSH, T4TOTAL, FREET4, T3FREE, THYROIDAB in the last 72 hours. Anemia Panel: No results for input(s): VITAMINB12, FOLATE, FERRITIN, TIBC, IRON, RETICCTPCT in the last 72 hours. Sepsis Labs: Recent Labs  Lab 10/03/18 1912  LATICACIDVEN 1.2    Recent Results (from the past 240 hour(s))  SARS Coronavirus 2 (CEPHEID - Performed in [place] hospital lab), Hosp Order     Status: None   Collection Time: 10/04/18  1:21 AM   Specimen: Nasopharyngeal Swab  Result Value Ref Range Status   SARS Coronavirus 2 NEGATIVE NEGATIVE Final    Comment: (NOTE) If result is NEGATIVE SARS-CoV-2 target nucleic acids are NOT DETECTED. The SARS-CoV-2 RNA is generally detectable in upper and lower  respiratory specimens during the acute phase of infection. The lowest  concentration of SARS-CoV-2 viral copies this assay can detect is 250  copies / mL. A negative result does not preclude SARS-CoV-2 infection  and should not be used as the sole basis for treatment or  other  patient management decisions.  A negative result may occur with  improper specimen collection / handling, submission of specimen other  than nasopharyngeal swab, presence of viral mutation(s) within the  areas targeted by this assay, and inadequate number of viral copies  (<250 copies / mL). A negative result must be combined with clinical  observations, patient history, and epidemiological information. If result is POSITIVE SARS-CoV-2 target nucleic acids are DETECTED. The SARS-CoV-2 RNA is generally detectable in upper and lower  respiratory specimens dur ing the acute phase of infection.  Positive  results are indicative of active infection with SARS-CoV-2.  Clinical  correlation with patient history and other diagnostic information is  necessary to determine patient infection status.  Positive results do  not rule out bacterial infection or co-infection with other viruses. If result is PRESUMPTIVE POSTIVE SARS-CoV-2 nucleic acids MAY BE PRESENT.   A presumptive positive result was obtained on the submitted specimen  and confirmed on repeat testing.  While 2019 novel coronavirus  (SARS-CoV-2) nucleic acids may be present  in the submitted sample  additional confirmatory testing may be necessary for epidemiological  and / or clinical management purposes  to differentiate between  SARS-CoV-2 and other Sarbecovirus currently known to infect humans.  If clinically indicated additional testing with an alternate test  methodology [id number]) is advised. The SARS-CoV-2 RNA is generally  detectable in upper and lower respiratory sp ecimens during the acute  phase of infection. The expected result is Negative. Fact Sheet for Patients:  BoilerBrush.com.cyhttps://www.fda.gov/media/136312/download Fact Sheet for Healthcare Providers: https://pope.com/https://www.fda.gov/media/136313/download This test is not yet approved or cleared by the [place] FDA and has been authorized for detection and/or diagnosis of  SARS-CoV-2 by FDA under an Emergency Use Authorization (EUA).  This EUA will remain in effect (meaning this test can be used) for the duration of the COVID-19 declaration under Section 564(b)(1) of the Act, 21 U.S.C. section 360bbb-3(b)(1), unless the authorization is terminated or revoked sooner. Performed at [facility], [address] [place], [place] [address]   Blood Cultures x 2 sites     Status: None   Collection Time: 10/04/18  1:48 AM   Specimen: BLOOD  Result Value Ref Range Status   Specimen Description BLOOD LEFT ARM  Final   Special Requests   Final    BOTTLES DRAWN AEROBIC AND ANAEROBIC Blood Culture adequate volume   Culture   Final    NO GROWTH 5 DAYS Performed at [facility], [address] [place], [place] [address]    Report Status 10/09/2018 FINAL  Final  Blood Cultures x 2 sites     Status: None   Collection Time: 10/04/18  3:28 AM   Specimen: BLOOD  Result Value Ref Range Status   Specimen Description BLOOD LEFT ARM  Final   Special Requests   Final    BOTTLES DRAWN AEROBIC AND ANAEROBIC Blood Culture adequate volume   Culture   Final    NO GROWTH 5 DAYS Performed at [facility], [address] [place], [place] [address]    Report Status 10/09/2018 FINAL  Final  Surgical pcr screen     Status: None   Collection Time: 10/07/18 12:11 PM   Specimen: Nasal Mucosa; Nasal Swab  Result Value Ref Range Status   MRSA, PCR NEGATIVE NEGATIVE Final   Staphylococcus aureus NEGATIVE NEGATIVE Final    Comment: (NOTE) The Xpert SA Assay (FDA approved for NASAL specimens in patients 50 years of age and older), is one component of a comprehensive surveillance program. It is not intended to diagnose infection nor to guide or monitor treatment. Performed at [facility], [address] [place], [place] [address]          Radiology Studies: No results found.      Scheduled Meds: . docusate sodium  100 mg Oral BID  .  enoxaparin (LOVENOX) injection  40 mg Subcutaneous Q24H  . feeding supplement (GLUCERNA SHAKE)  237 mL Oral BID WC  . insulin aspart  0-15 Units Subcutaneous TID WC  . insulin aspart  0-5 Units Subcutaneous QHS  . insulin glargine  10 Units Subcutaneous Daily  . insulin glargine  20 Units Subcutaneous QHS  . metroNIDAZOLE  500 mg Oral Q8H  . nutrition supplement (JUVEN)  1 packet Oral BID BM  . sodium chloride flush  3 mL Intravenous Q12H   Continuous Infusions: . sodium chloride Stopped (10/09/18 1007)  . sodium chloride Stopped (10/09/18 1732)  . cefTRIAXone (ROCEPHIN)  IV 2 g (  10/10/18 0548)  . lactated ringers 10 mL/hr at 10/09/18 1126  . methocarbamol (ROBAXIN) IV    . vancomycin 1,250 mg (10/09/18 2234)     LOS: 5 days     Jacquelin Hawkingalph Adaja Wander, MD [facility] 10/10/2018, 10:47 AM  If 7PM-7AM, please contact night-coverage [email or web address]

## 2018-10-10 NOTE — Progress Notes (Signed)
Patient ID: Jon House, male   DOB: 1971-02-07, 48 y.o.   MRN: [id number] Postoperative day 1 second ray amputation.  The dressing is clean and dry.  Infection seems to be isolated to the second ray.  Patient may begin physical therapy ideally nonweightbearing left foot.  Would continue IV antibiotics until tomorrow.  There was no deep abscess.  Okay for discharge to home from orthopedic standpoint once safe with therapy.

## 2018-10-10 NOTE — Progress Notes (Signed)
Nutrition Follow-up  DOCUMENTATION CODES:   Not applicable  INTERVENTION:    Continue Glucerna Shake po BID, each supplement provides 220 kcal and 10 grams of protein  Continue Juven Fruit Punch BID, each serving provides 95kcal and 2.5g of protein (amino acids glutamine and arginine)  NUTRITION DIAGNOSIS:   Increased nutrient needs related to wound healing as evidenced by estimated needs.  Ongoing  GOAL:   Patient will meet greater than or equal to 90% of their needs  Progressing  MONITOR:   PO intake, Supplement acceptance, Labs, Weight trends, I & O's, Skin  REASON FOR ASSESSMENT:   Consult Wound healing  ASSESSMENT:   48 y.o. 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    6/10- amputation of left second toe  RD working remotely.  Unable to reach pt by phone. Meal completions charted as 75-100% since admit. Per flowsheet pt looks to be compliant with Juven BID and Glucerna BID. Continue with current interventions.   Admission weight: 77.6 kg Current weight: 74.9 kg   I/O: +6,854 ml since admit UOP: 1,875 ml x 24 hrs   Medications: colace, SS novolog, lantus Labs: CBG 107-302  Diet Order:   Diet Order            Diet Carb Modified Fluid consistency: Thin; Room service appropriate? Yes  Diet effective now              EDUCATION NEEDS:   Not appropriate for education at this time  Skin:  Skin Assessment: Skin Integrity Issues: Skin Integrity Issues:: Diabetic Ulcer Diabetic Ulcer: Amputation R great toe, Left second digit  Last BM:  6/10  Height:   Ht Readings from Last 1 Encounters:  10/09/18 5' 10.98" (1.803 m)    Weight:   Wt Readings from Last 1 Encounters:   10/09/18 74.9 kg    Ideal Body Weight:  78.18 kg  BMI:  Body mass index is 23.04 kg/m.  Estimated Nutritional Needs:   Kcal:  2100-2300  Protein:  105-115 grams  Fluid:  2.1 - 2.3 L/day   Mariana Single RD, LDN Clinical Nutrition Pager # - [phone]

## 2018-10-10 NOTE — Evaluation (Signed)
Physical Therapy Evaluation Patient Details Name: Jon House MRN: [id number] DOB: 1971/04/16 Today's Date: 10/10/2018   History of Present Illness  48 y/o male admitted for further surgery on L foot, after initially having the great toe removed now presents with sx on 6/10 for removal of the second toe on L foot for nonhealing and osteomyelitis. Ortho noted abscess on L midfoot, recommending further procedure.  PMH includes DM, malnutrition, PN with poor sensation L foot  Clinical Impression  Pt was seen for mobility and could maintain NWB on LLE with interpretation and use of L cast shoe for protection of insensate L foot.  Pt is motivated, wants to go home without further therapy but would still recommend consideration as he is going to need strengthening and has previously declined PT.  Follow acutely for gait and strengthening to LLE so pt can continue on with safer gait in case he refuses home care.    Follow Up Recommendations Home health PT;Supervision for mobility/OOB    Equipment Recommendations  None recommended by PT    Recommendations for Other Services       Precautions / Restrictions Precautions Precautions: Fall Required Braces or Orthoses: Other Brace Other Brace: L cast shoe Restrictions Weight Bearing Restrictions: Yes LLE Weight Bearing: Non weight bearing      Mobility  Bed Mobility Overal bed mobility: Modified Independent                Transfers Overall transfer level: Needs assistance Equipment used: Rolling walker (2 wheeled);1 person hand held assist Transfers: Sit to/from Stand Sit to Stand: Min guard(for safety)         General transfer comment: Pt is able to steady in standing with RW in NWB on LLE  Ambulation/Gait Ambulation/Gait assistance: Min guard Gait Distance (Feet): 80 Feet Assistive device: Rolling walker (2 wheeled)   Gait velocity: reduced Gait velocity interpretation: <1.31 ft/sec, indicative of household  ambulator General Gait Details: pt is hopping on RLE as interpreted and turns carefully on RW  Stairs            Wheelchair Mobility    Modified Rankin (Stroke Patients Only)       Balance Overall balance assessment: Needs assistance Sitting-balance support: Single extremity supported Sitting balance-Leahy Scale: Good     Standing balance support: Bilateral upper extremity supported;During functional activity Standing balance-Leahy Scale: Poor Standing balance comment: fair with RW                             Pertinent Vitals/Pain Pain Assessment: Faces Faces Pain Scale: Hurts a little bit Pain Location: L foot Pain Descriptors / Indicators: Operative site guarding Pain Intervention(s): Monitored during session;Premedicated before session;Repositioned    Home Living Family/patient expects to be discharged to:: Private residence Living Arrangements: Spouse/significant other Available Help at Discharge: Family;Available PRN/intermittently Type of Home: House Home Access: Stairs to enter Entrance Stairs-Rails: None Entrance Stairs-Number of Steps: 2(lower height per family) Home Layout: One level Home Equipment: Environmental consultantWalker - 2 wheels;Cane - single point;Bedside commode;Shower seat(information from family)      Prior Function Level of Independence: Independent with assistive device(s)         Comments: pt is NWB for a week per his report     Hand Dominance   Dominant Hand: Right    Extremity/Trunk Assessment   Upper Extremity Assessment Upper Extremity Assessment: Overall WFL for tasks assessed    Lower Extremity Assessment Lower  Extremity Assessment: LLE deficits/detail LLE Deficits / Details: knee is 4+ strength    Cervical / Trunk Assessment Cervical / Trunk Assessment: Normal  Communication   Communication: Prefers language other than [place];Interpreter utilized  Cognition Arousal/Alertness: Awake/alert Behavior During Therapy: WFL  for tasks assessed/performed Overall Cognitive Status: Within Functional Limits for tasks assessed                                 General Comments: interpreter contacted from his posted information but half way through the initial eval questions she announced she was going to have to sign off.  The remainder of the eval was conducted with family interpretation      General Comments General comments (skin integrity, edema, etc.): applied cast shoe for gait on RW    Exercises     Assessment/Plan    PT Assessment Patient needs continued PT services  PT Problem List Decreased activity tolerance;Decreased balance;Decreased mobility;Decreased knowledge of precautions;Impaired sensation;Decreased skin integrity;Pain       PT Treatment Interventions DME instruction;Gait training;Stair training;Functional mobility training;Therapeutic activities;Therapeutic exercise;Balance training;Neuromuscular re-education;Patient/family education    PT Goals (Current goals can be found in the Care Plan section)  Acute Rehab PT Goals Patient Stated Goal: to get home and feel better PT Goal Formulation: With patient/family Time For Goal Achievement: 10/24/18 Potential to Achieve Goals: Good    Frequency Min 4X/week   Barriers to discharge Inaccessible home environment has stairs to enter house    Co-evaluation               AM-PAC PT "6 Clicks" Mobility  Outcome Measure Help needed turning from your back to your side while in a flat bed without using bedrails?: None Help needed moving from lying on your back to sitting on the side of a flat bed without using bedrails?: None Help needed moving to and from a bed to a chair (including a wheelchair)?: A Little Help needed standing up from a chair using your arms (e.g., wheelchair or bedside chair)?: A Little Help needed to walk in hospital room?: A Little Help needed climbing 3-5 steps with a railing? : A Lot 6 Click Score:  19    End of Session Equipment Utilized During Treatment: Gait belt Activity Tolerance: Patient tolerated treatment well Patient left: in bed;with call bell/phone within reach;Other (comment);with bed alarm set(elevated bed) Nurse Communication: Mobility status PT Visit Diagnosis: Unsteadiness on feet (R26.81);Other (comment);Pain(NWB on LLE) Pain - Right/Left: Left Pain - part of body: Ankle and joints of foot    Time: 6767-2094 PT Time Calculation (min) (ACUTE ONLY): 27 min   Charges:   PT Evaluation $PT Eval Moderate Complexity: 1 Mod PT Treatments $Gait Training: 8-22 mins       Ramond Dial 10/10/2018, 1:32 PM  Mee Hives, PT MS Acute Rehab Dept. Number: [place]

## 2018-10-11 LAB — GLUCOSE, CAPILLARY
Glucose-Capillary: 138 mg/dL — ABNORMAL HIGH (ref 70–99)
Glucose-Capillary: 199 mg/dL — ABNORMAL HIGH (ref 70–99)

## 2018-10-11 MED ORDER — PHENYLEPHRINE HCL-NACL 10-0.9 MG/250ML-% IV SOLN
INTRAVENOUS | Status: AC
  Filled 2018-10-11: qty 1250

## 2018-10-11 MED ORDER — PROPOFOL 1000 MG/100ML IV EMUL
INTRAVENOUS | Status: AC
  Filled 2018-10-11: qty 500

## 2018-10-11 MED ORDER — BASAGLAR KWIKPEN 100 UNIT/ML ~~LOC~~ SOPN: 20.0000 [IU] | PEN_INJECTOR | Freq: Every day | SUBCUTANEOUS | 0 refills | Status: DC

## 2018-10-11 MED ORDER — DOCUSATE SODIUM 100 MG PO CAPS: 100.0000 mg | ORAL_CAPSULE | Freq: Two times a day (BID) | ORAL | 0 refills | Status: DC

## 2018-10-11 NOTE — Discharge Instructions (Addendum)
Non-weight bearing on left foot. Dressing change with Dr. Sharol Given in one week.

## 2018-10-11 NOTE — TOC Transition Note (Signed)
Transition of Care [facility]) - CM/SW Discharge Note   Patient Details  Name: Jon House MRN: [id number] Date of Birth: [birth year]  [facility] CM/SW Contact:  Bartholomew Crews, RN Phone Number: [phone] 10/11/2018, 2:05 PM   Clinical Narrative:    Spoke with patient at the bedside with interpreter assistance. PTA home with family. Discussed [place] arranged through [facility] PT for charity and will be able to start on Monday. Patient has walker and cane, but requested wheelchair. CM able to obtain used wheelchair from [place]. Hospital f/u scheduled at [facility] - afternoon appt per pt request. Patient reports having transportation home. Patient states that he is able to pick up his medications at the pharmacy. No other transition of care needs identified.    Final next level of care: Home w Home Health Services Barriers to Discharge: No Barriers Identified   Patient Goals and CMS Choice   CMS Medicare.gov Compare Post Acute Care list provided to:: Patient    Discharge Placement                       Discharge Plan and Services In-house Referral: Interpreting Services Discharge Planning Services: CM Consult, Follow-up appt scheduled Post Acute Care Choice: Durable Medical Equipment, Home Health          DME Arranged: Wheelchair manual DME Agency: NA Date DME Agency Contacted: 10/11/18 Time DME Agency Contacted: [phone] Representative spoke with at DME Agency: Rosaria Ferries at [place]: PT, Social Work [place]: Microbiologist ([place]) Date [place]: 10/11/18 Time [place]: [place] Representative spoke with at [place]: [place] ([place]) Interventions     Readmission Risk Interventions No flowsheet data found.

## 2018-10-11 NOTE — Progress Notes (Signed)
Subjective: 2 Days Post-Op Procedure(s) (LRB): LEFT FOOT 2ND TOE AND POSSIBLE 3RD TOE AMPUTATION (Left)  Spoke with patient through [place] interpreter by tablet.  Patient reports pain as mild.    Objective: Vital signs in last 24 hours: Temp:  [98 F (36.7 C)-98.4 F (36.9 C)] 98.4 F (36.9 C) (06/12 0836) Pulse Rate:  [77-84] 79 (06/12 0836) Resp:  [16-20] 20 (06/12 0836) BP: (103-130)/(60-83) 130/79 (06/12 0836) SpO2:  [94 %-96 %] 96 % (06/12 0836) Weight:  [75.1 kg] 75.1 kg (06/11 2035)  Intake/Output from previous day: 06/11 0701 - 06/12 0700 In: 2284.4 [P.O.:1420; I.V.:515.5; IV Piggyback:349] Out: 580 [Urine:580] Intake/Output this shift: No intake/output data recorded.  Recent Labs    10/08/18 0946 10/10/18 0648  HGB 12.5* 11.2*   Recent Labs    10/08/18 0946 10/10/18 0648  WBC 5.2 9.5  RBC 4.22 3.74*  HCT 36.7* 32.7*  PLT 272 305   Recent Labs    10/08/18 0946 10/10/18 0648  NA 137 138  K 3.9 3.7  CL 99 104  CO2 29 26  BUN 16 18  CREATININE 0.72 0.65  GLUCOSE 241* 198*  CALCIUM 8.8* 8.4*   No results for input(s): LABPT, INR in the last 72 hours.  Operative dressing in place and without drainage to left foot.    Assessment/Plan: 2 Days Post-Op Procedure(s) (LRB): LEFT FOOT 2ND TOE AND POSSIBLE 3RD TOE AMPUTATION (Left) Instructed patient to leave operative dressing in place and keep dry until follow up in the office next week.  Maintain non weight bearing with crutches or walker as much as possible. Instructed to call office for questions or concerns.  Okay to DC from orthopedic standpoint once cleared by therapy.   Erlinda Hong, PA-C 10/11/2018, 9:02 AM  [employer] [phone]

## 2018-10-11 NOTE — Discharge Summary (Signed)
Physician Discharge Summary  Jon House GYF:[id number] DOB: 1970/12/13 DOA: 10/03/2018  PCP: Antony Blackbird, MD  Admit date: 10/03/2018 Discharge date: 10/11/2018  Admitted From: Home Disposition: Home  Recommendations for Outpatient Follow-up:  1. Follow up with PCP in 1 week 2. Follow up with orthopedic surgery in one week for dressing change 3. Please obtain BMP/CBC in one week 4. Please follow up on the following pending results: None  Home Health: PT Equipment/Devices: Environmental consultant (patient has one already)  Discharge Condition: Stable CODE STATUS: Full code Diet recommendation: Heart healthy/carb modified   Brief/Interim Summary:  Admission HPI written by Vianne Bulls, MD   Chief Complaint: Left foot wound worsening despite antibiotic   HPI: Jon House is a 48 y.o. 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  Patient believes that he stepped on a nail that pierced the plantar aspect of his left foot on 09/30/2018, and and then developed some pain, redness, and swelling.  He was seen at an outpatient clinic on 10/02/2018, was started on Keflex and had his Tdap updated, but the pain, erythema, and edema has continued to worsen and he has now developed fevers.  There has been thin yellow drainage. He went to an urgent care tonight, was noted to be slightly tachycardic and febrile, and was directed to the ED for further evaluation of this.  He denied cough, shortness of breath, or sick contacts.  ED Course: Upon arrival to the ED, patient is found to be febrile to 38.2 C, saturating well on room air, and with other vitals normal.  Chemistry panel is notable for glucose of 179 and CBC features a  leukocytosis to 12,400.  Lactic acid is reassuringly normal.  Radiographs of the left foot are negative for acute bony abnormality.  Patient was treated with IV ciprofloxacin in the ED.  COVID-19 screening test is pending.  Patient will be observed for ongoing evaluation and management of foot infection in a poorly controlled diabetic with history of osteomyelitis.    Hospital course:  Diabetic foot infection Osteomyelitis Patient with evidence of left second metatarsal osteomyelitis.  Empirically started on vancomycin, metronidazole and ceftriaxone.  Patient is afebrile.  No leukocytosis.  Orthopedic surgery is consulted and spine for secondary amputation of left foot.  Blood cultures are no growth to date. S/p 2nd ray amputation on 6/10. Antibiotics discontinued prior to discharge. Non weight bearing (ideally) of left foot. Will need to follow-up with Dr. Sharol Given in one week  Diabetes mellitus, type II Patient with uncontrolled diabetes on insulin therapy.  Blood sugars better controlled with increase of Lantus.  Hemoglobin A1c of 9.6% from 10/02/2018. Spike in blood sugar secondary to Decadron injection. Will discharge on increased home dose of Lantus 20 units qhs. Continue metformin.  Discharge Diagnoses:  Principal Problem:   Diabetic infection of left foot ([place]) Active Problems:   Uncontrolled insulin dependent diabetes mellitus ([facility])   Subacute osteomyelitis, left ankle and foot [facility])    Discharge Instructions  Discharge Instructions    Call MD for:  severe uncontrolled pain   Complete by: As directed    Call MD for:  temperature >100.4   Complete by: As directed    Diet - low sodium heart healthy   Complete by: As  directed    Increase activity slowly   Complete by: As directed      Allergies as of 10/11/2018      Reactions   Bee Venom Hives      Medication List    STOP taking these medications   cephALEXin 500 MG capsule Commonly known as: KEFLEX     TAKE these  medications   Basaglar KwikPen 100 UNIT/ML Sopn Inject 0.2 mLs (20 Units total) into the skin at bedtime. What changed: how much to take   docusate sodium 100 MG capsule Commonly known as: COLACE Take 1 capsule (100 mg total) by mouth 2 (two) times daily.   glucose blood test strip Commonly known as: True Metrix Blood Glucose Test Use as instructed to check blood sugars up to 3 x daily   insulin aspart protamine- aspart (70-30) 100 UNIT/ML injection Commonly known as: NOVOLOG MIX 70/30 Inject 0.1 mLs (10 Units total) into the skin 2 (two) times daily with a meal.   Insulin Pen Needle 31G X 5 MM Misc Commonly known as: B-D UF III MINI PEN NEEDLES Use as instructed. Inject into the skin once nightly.   metFORMIN 1000 MG tablet Commonly known as: GLUCOPHAGE Take 1 tablet (1,000 mg total) by mouth 2 (two) times daily with a meal. What changed: how much to take   oxyCODONE 5 MG immediate release tablet Commonly known as: Oxy IR/ROXICODONE Take 1 tablet (5 mg total) by mouth every 6 (six) hours as needed for moderate pain.   True Metrix Meter w/Device Kit Use to check blood sugars up to  3 times daily   TRUEplus Lancets 28G Misc Use to help check blood sugars up to 3 times per day      Follow-up Information    Newt Minion, MD. Go on 10/16/2018.   Specialty: Orthopedic Surgery Why: Patient to see Dr. Sharol Given Wednesday 10/16/2018 at 3:45pm Contact information: [place] [address] [phone]        Antony Blackbird, MD. Schedule an appointment as soon as possible for a visit in 1 week(s).   Specialty: Family Medicine Why: Hospital follow-up Contact information: [place] [address] [phone]          Allergies  Allergen Reactions   Bee Venom Hives    Consultations:  Orthopedic surgery   Procedures/Studies: Mr Foot Left Wo Contrast  Result Date: 10/05/2018 CLINICAL DATA:  Draining ulcer at the plantar base  of the second metatarsal head. Prior great toe amputation. EXAM: MRI OF THE LEFT FOOT WITHOUT CONTRAST TECHNIQUE: Multiplanar, multisequence MR imaging of the left forefoot was performed. No intravenous contrast was administered. COMPARISON:  Left foot x-rays dated October 03, 2018. FINDINGS: Bones/Joint/Cartilage Abnormal marrow edema with decreased T1 marrow signal involving the entire second metatarsal. Similar appearing signal in the lateral cuneiform pathologic fracture of the second metatarsal head and neck. Faint marrow edema in the third metatarsal with preserved T1 marrow signal, consistent with reactive osteitis. Dorsal and medial subluxation at the second MTP joint. Large second MTP joint effusion. Small third MTP joint effusion. Prior great toe amputation. Degenerative subchondral marrow edema in the distal medial cuneiform at the first TMT joint. Ligaments The lateral collateral ligament at the second MTP joint is torn. Remaining collateral ligaments are intact. Muscles and Tendons Flexor, peroneal and extensor compartment tendons are intact. Increased T2 signal within the intrinsic muscles of the forefoot, nonspecific, but likely related to diabetic muscle changes. Soft tissue Deep  soft tissue ulceration at the plantar base of the second MTP joint, extending to the joint. No fluid collection or hematoma. No soft tissue mass. IMPRESSION: 1. Soft tissue ulceration at the plantar base of the second MTP joint with extension to the joint. Underlying second MTP joint septic arthritis with osteomyelitis of the entire second metatarsal, as well as the lateral cuneiform. 2. Small third MTP joint effusion, potentially reactive, but septic arthritis is not excluded. Reactive osteitis of the third metatarsal. 3. No abscess. 4. Pathologic fracture of the second metatarsal head and neck. Electronically Signed   By: Titus Dubin M.D.   On: 10/05/2018 09:19   Dg Foot Complete Left  Result Date: 10/03/2018 CLINICAL  DATA:  Right foot pain after stepping on nail EXAM: LEFT FOOT - COMPLETE 3+ VIEW COMPARISON:  None. FINDINGS: There is no evidence of fracture or dislocation. Status post left great toe amputation. No osteolysis. There is no evidence of arthropathy or other focal bone abnormality. Soft tissues are unremarkable. IMPRESSION: Negative. Electronically Signed   By: Ulyses Jarred M.D.   On: 10/03/2018 20:19     Subjective: Some pain last night which was relieved with Tylenol.  Discharge Exam: Vitals:   10/11/18 0603 10/11/18 0836  BP: 110/65 130/79  Pulse: 79 79  Resp: 18 20  Temp: 98 F (36.7 C) 98.4 F (36.9 C)  SpO2: 96% 96%   Vitals:   10/10/18 1705 10/10/18 2035 10/11/18 0603 10/11/18 0836  BP: 126/83 103/64 110/65 130/79  Pulse: 84 77 79 79  Resp: '16 19 18 20  ' Temp: 98.1 F (36.7 C) 98.1 F (36.7 C) 98 F (36.7 C) 98.4 F (36.9 C)  TempSrc: Oral Oral Oral Oral  SpO2: 94% 96% 96% 96%  Weight:  75.1 kg    Height:        General: Pt is alert, awake, not in acute distress Cardiovascular: RRR, S1/S2 +, no rubs, no gallops Respiratory: CTA bilaterally, no wheezing, no rhonchi Abdominal: Soft, NT, ND, bowel sounds + Extremities: no edema, no cyanosis. Left foot with intact bandage.    The results of significant diagnostics from this hospitalization (including imaging, microbiology, ancillary and laboratory) are listed below for reference.     Microbiology: Recent Results (from the past 240 hour(s))  SARS Coronavirus 2 (CEPHEID - Performed in [place] hospital lab), Hosp Order     Status: None   Collection Time: 10/04/18  1:21 AM   Specimen: Nasopharyngeal Swab  Result Value Ref Range Status   SARS Coronavirus 2 NEGATIVE NEGATIVE Final    Comment: (NOTE) If result is NEGATIVE SARS-CoV-2 target nucleic acids are NOT DETECTED. The SARS-CoV-2 RNA is generally detectable in upper and lower  respiratory specimens during the acute phase of infection. The lowest    concentration of SARS-CoV-2 viral copies this assay can detect is 250  copies / mL. A negative result does not preclude SARS-CoV-2 infection  and should not be used as the sole basis for treatment or other  patient management decisions.  A negative result may occur with  improper specimen collection / handling, submission of specimen other  than nasopharyngeal swab, presence of viral mutation(s) within the  areas targeted by this assay, and inadequate number of viral copies  (<250 copies / mL). A negative result must be combined with clinical  observations, patient history, and epidemiological information. If result is POSITIVE SARS-CoV-2 target nucleic acids are DETECTED. The SARS-CoV-2 RNA is generally detectable in upper and lower  respiratory  specimens dur ing the acute phase of infection.  Positive  results are indicative of active infection with SARS-CoV-2.  Clinical  correlation with patient history and other diagnostic information is  necessary to determine patient infection status.  Positive results do  not rule out bacterial infection or co-infection with other viruses. If result is PRESUMPTIVE POSTIVE SARS-CoV-2 nucleic acids MAY BE PRESENT.   A presumptive positive result was obtained on the submitted specimen  and confirmed on repeat testing.  While 2019 novel coronavirus  (SARS-CoV-2) nucleic acids may be present in the submitted sample  additional confirmatory testing may be necessary for epidemiological  and / or clinical management purposes  to differentiate between  SARS-CoV-2 and other Sarbecovirus currently known to infect humans.  If clinically indicated additional testing with an alternate test  methodology [phone]) is advised. The SARS-CoV-2 RNA is generally  detectable in upper and lower respiratory sp ecimens during the acute  phase of infection. The expected result is Negative. Fact Sheet for Patients:  StrictlyIdeas.no Fact Sheet  for Healthcare Providers: BankingDealers.co.za This test is not yet approved or cleared by the [place] FDA and has been authorized for detection and/or diagnosis of SARS-CoV-2 by FDA under an Emergency Use Authorization (EUA).  This EUA will remain in effect (meaning this test can be used) for the duration of the COVID-19 declaration under Section 564(b)(1) of the Act, 21 U.S.C. section 360bbb-3(b)(1), unless the authorization is terminated or revoked sooner. Performed at [facility], [place] [address] [place], [place] [address]   Blood Cultures x 2 sites     Status: None   Collection Time: 10/04/18  1:48 AM   Specimen: BLOOD  Result Value Ref Range Status   Specimen Description BLOOD LEFT ARM  Final   Special Requests   Final    BOTTLES DRAWN AEROBIC AND ANAEROBIC Blood Culture adequate volume   Culture   Final    NO GROWTH 5 DAYS Performed at [facility], [place] [address], [place], [place] [address]    Report Status 10/09/2018 FINAL  Final  Blood Cultures x 2 sites     Status: None   Collection Time: 10/04/18  3:28 AM   Specimen: BLOOD  Result Value Ref Range Status   Specimen Description BLOOD LEFT ARM  Final   Special Requests   Final    BOTTLES DRAWN AEROBIC AND ANAEROBIC Blood Culture adequate volume   Culture   Final    NO GROWTH 5 DAYS Performed at [facility], [address] [place], [place] [address]    Report Status 10/09/2018 FINAL  Final  Surgical pcr screen     Status: None   Collection Time: 10/07/18 12:11 PM   Specimen: Nasal Mucosa; Nasal Swab  Result Value Ref Range Status   MRSA, PCR NEGATIVE NEGATIVE Final   Staphylococcus aureus NEGATIVE NEGATIVE Final    Comment: (NOTE) The Xpert SA Assay (FDA approved for NASAL specimens in patients 55 years of age and older), is one component of a comprehensive surveillance program. It is not intended to diagnose infection nor to guide or monitor treatment. Performed  at [facility], [place] [address] [place], [place] [address]      Labs: BNP (last 3 results) No results for input(s): BNP in the last 8760 hours. Basic Metabolic Panel: Recent Labs  Lab 10/05/18 1034 10/06/18 0939 10/08/18 0946 10/10/18 0648  NA 134* 136 137 138  K 4.3 4.3 3.9 3.7  CL 98 99 99 104  CO2 '24 28 29 26  ' GLUCOSE 267* 331* 241* 198*  BUN '17 17 16 18  ' CREATININE 0.75 0.68 0.72 0.65  CALCIUM 8.8* 8.9 8.8* 8.4*   Liver Function Tests: No results for input(s): AST, ALT, ALKPHOS, BILITOT, PROT, ALBUMIN in the last 168 hours. No results for input(s): LIPASE, AMYLASE in the last 168 hours. No results for input(s): AMMONIA in the last 168 hours. CBC: Recent Labs  Lab 10/05/18 1034 10/06/18 0939 10/08/18 0946 10/10/18 0648  WBC 6.9 5.5 5.2 9.5  HGB 12.7* 12.7* 12.5* 11.2*  HCT 36.4* 36.6* 36.7* 32.7*  MCV 87.1 86.9 87.0 87.4  PLT 201 221 272 305   Cardiac Enzymes: No results for input(s): CKTOTAL, CKMB, CKMBINDEX, TROPONINI in the last 168 hours. BNP: Invalid input(s): POCBNP CBG: Recent Labs  Lab 10/10/18 0656 10/10/18 1125 10/10/18 1701 10/10/18 2037 10/11/18 0704  GLUCAP 170* 252* 157* 199* 138*   D-Dimer No results for input(s): DDIMER in the last 72 hours. Hgb A1c No results for input(s): HGBA1C in the last 72 hours. Lipid Profile No results for input(s): CHOL, HDL, LDLCALC, TRIG, CHOLHDL, LDLDIRECT in the last 72 hours. Thyroid function studies No results for input(s): TSH, T4TOTAL, T3FREE, THYROIDAB in the last 72 hours.  Invalid input(s): FREET3 Anemia work up No results for input(s): VITAMINB12, FOLATE, FERRITIN, TIBC, IRON, RETICCTPCT in the last 72 hours. Urinalysis    Component Value Date/Time   COLORURINE YELLOW 07/29/2018 1159   APPEARANCEUR CLEAR 07/29/2018 1159   LABSPEC 1.031 (H) 07/29/2018 1159   PHURINE 6.0 07/29/2018 1159   GLUCOSEU >=500 (A) 07/29/2018 1159   HGBUR NEGATIVE 07/29/2018 1159   BILIRUBINUR NEGATIVE  07/29/2018 1159   KETONESUR NEGATIVE 07/29/2018 1159   PROTEINUR NEGATIVE 07/29/2018 1159   NITRITE NEGATIVE 07/29/2018 1159   LEUKOCYTESUR NEGATIVE 07/29/2018 1159   Sepsis Labs Invalid input(s): PROCALCITONIN,  WBC,  LACTICIDVEN Microbiology Recent Results (from the past 240 hour(s))  SARS Coronavirus 2 (CEPHEID - Performed in [place] hospital lab), Hosp Order     Status: None   Collection Time: 10/04/18  1:21 AM   Specimen: Nasopharyngeal Swab  Result Value Ref Range Status   SARS Coronavirus 2 NEGATIVE NEGATIVE Final    Comment: (NOTE) If result is NEGATIVE SARS-CoV-2 target nucleic acids are NOT DETECTED. The SARS-CoV-2 RNA is generally detectable in upper and lower  respiratory specimens during the acute phase of infection. The lowest  concentration of SARS-CoV-2 viral copies this assay can detect is 250  copies / mL. A negative result does not preclude SARS-CoV-2 infection  and should not be used as the sole basis for treatment or other  patient management decisions.  A negative result may occur with  improper specimen collection / handling, submission of specimen other  than nasopharyngeal swab, presence of viral mutation(s) within the  areas targeted by this assay, and inadequate number of viral copies  (<250 copies / mL). A negative result must be combined with clinical  observations, patient history, and epidemiological information. If result is POSITIVE SARS-CoV-2 target nucleic acids are DETECTED. The SARS-CoV-2 RNA is generally detectable in upper and lower  respiratory specimens dur ing the acute phase of infection.  Positive  results are indicative of active infection with SARS-CoV-2.  Clinical  correlation with patient history and other diagnostic information is  necessary to determine patient infection status.  Positive results do  not rule out bacterial infection or co-infection with other viruses. If result is  PRESUMPTIVE POSTIVE SARS-CoV-2 nucleic acids  MAY BE PRESENT.   A presumptive positive result was obtained on the submitted specimen  and confirmed on repeat testing.  While 2019 novel coronavirus  (SARS-CoV-2) nucleic acids may be present in the submitted sample  additional confirmatory testing may be necessary for epidemiological  and / or clinical management purposes  to differentiate between  SARS-CoV-2 and other Sarbecovirus currently known to infect humans.  If clinically indicated additional testing with an alternate test  methodology [phone]) is advised. The SARS-CoV-2 RNA is generally  detectable in upper and lower respiratory sp ecimens during the acute  phase of infection. The expected result is Negative. Fact Sheet for Patients:  StrictlyIdeas.no Fact Sheet for Healthcare Providers: BankingDealers.co.za This test is not yet approved or cleared by the [place] FDA and has been authorized for detection and/or diagnosis of SARS-CoV-2 by FDA under an Emergency Use Authorization (EUA).  This EUA will remain in effect (meaning this test can be used) for the duration of the COVID-19 declaration under Section 564(b)(1) of the Act, 21 U.S.C. section 360bbb-3(b)(1), unless the authorization is terminated or revoked sooner. Performed at [facility], [place] [address], [place], [place] [address]   Blood Cultures x 2 sites     Status: None   Collection Time: 10/04/18  1:48 AM   Specimen: BLOOD  Result Value Ref Range Status   Specimen Description BLOOD LEFT ARM  Final   Special Requests   Final    BOTTLES DRAWN AEROBIC AND ANAEROBIC Blood Culture adequate volume   Culture   Final    NO GROWTH 5 DAYS Performed at [facility], [place] [address], [place], [place] [address]    Report Status 10/09/2018 FINAL  Final  Blood Cultures x 2 sites     Status: None   Collection Time: 10/04/18  3:28 AM   Specimen: BLOOD  Result Value Ref Range Status   Specimen Description  BLOOD LEFT ARM  Final   Special Requests   Final    BOTTLES DRAWN AEROBIC AND ANAEROBIC Blood Culture adequate volume   Culture   Final    NO GROWTH 5 DAYS Performed at [facility], [address], [place], [place] [address]    Report Status 10/09/2018 FINAL  Final  Surgical pcr screen     Status: None   Collection Time: 10/07/18 12:11 PM   Specimen: Nasal Mucosa; Nasal Swab  Result Value Ref Range Status   MRSA, PCR NEGATIVE NEGATIVE Final   Staphylococcus aureus NEGATIVE NEGATIVE Final    Comment: (NOTE) The Xpert SA Assay (FDA approved for NASAL specimens in patients 82 years of age and older), is one component of a comprehensive surveillance program. It is not intended to diagnose infection nor to guide or monitor treatment. Performed at [facility], [place] [address], [place], [place] [address]      SIGNED:   Cordelia Poche, MD [facility] 10/11/2018, 11:30 AM

## 2018-10-11 NOTE — Progress Notes (Signed)
Physical Therapy Treatment Patient Details Name: Jon House MRN: [id number] DOB: [birth year] Today's Date: 10/11/2018    History of Present Illness 48 y/o male admitted for further surgery on L foot, after initially having the great toe removed now presents with sx on 6/10 for removal of the second toe on L foot for nonhealing and osteomyelitis. Ortho noted abscess on L midfoot, recommending further procedure.  PMH includes DM, malnutrition, PN with poor sensation L foot    PT Comments    Pt was up to walk with PT on RW, then to stairs after a brief sitting rest to instruct pattern via interpreter and PT demonstrating.  Pt is able to mirror the teaching and is very motivated, did well with HEP.  Pt had a family member via facetime able to hear all instructions (daughter), and pt did not have any follow up questions.  Talked with daughter and pt via interpretation to call the hosp if he has questions after DC to home as pt may decline HHPT.  Daughter and pt give feedback of understanding, and pt will be continuing acute therapy as pt is not yet DC to home.     Follow Up Recommendations  Home health PT;Supervision for mobility/OOB     Equipment Recommendations  None recommended by PT    Recommendations for Other Services       Precautions / Restrictions Precautions Precautions: Fall Required Braces or Orthoses: Other Brace Other Brace: L cast shoe Restrictions Weight Bearing Restrictions: Yes LLE Weight Bearing: Non weight bearing    Mobility  Bed Mobility Overal bed mobility: Modified Independent                Transfers Overall transfer level: Needs assistance Equipment used: Rolling walker (2 wheeled);1 person hand held assist Transfers: Sit to/from Stand Sit to Stand: Min guard(for safety)         General transfer comment: pt could gain control of balance standing without PT using walker  Ambulation/Gait Ambulation/Gait assistance: Min guard Gait  Distance (Feet): 150 Feet(75 x 2 with short sitting rest to get on stairs) Assistive device: Rolling walker (2 wheeled)   Gait velocity: reduced Gait velocity interpretation: <1.31 ft/sec, indicative of household ambulator General Gait Details: hop on RLE and LLE in cast shoe   Stairs Stairs: Yes Stairs assistance: Min guard Stair Management: Two rails;Step to pattern(with interpretation for NWB) Number of Stairs: (4) General stair comments: pt is able to manage steps and via interpreter was told he has rails and 2 steps but yesterday had reported 4   Wheelchair Mobility    Modified Rankin (Stroke Patients Only)       Balance Overall balance assessment: Needs assistance Sitting-balance support: Single extremity supported Sitting balance-Leahy Scale: Good     Standing balance support: Bilateral upper extremity supported;During functional activity Standing balance-Leahy Scale: Fair Standing balance comment: fair with RW                            Cognition Arousal/Alertness: Awake/alert Behavior During Therapy: WFL for tasks assessed/performed Overall Cognitive Status: Within Functional Limits for tasks assessed                                 General Comments: interpretation for entire visit      Exercises General Exercises - Lower Extremity Ankle Circles/Pumps: AROM;Both;5 reps Quad Sets: AROM;Both;10 reps Gluteal Sets:  AROM;Both;10 reps Hip ABduction/ADduction: AROM;Both;10 reps Straight Leg Raises: AROM;Both;10 reps    General Comments        Pertinent Vitals/Pain Pain Assessment: 0-10 Pain Score: 6  Pain Location: L foot Pain Descriptors / Indicators: Operative site guarding Pain Intervention(s): Limited activity within patient's tolerance;Monitored during session;Premedicated before session;Repositioned    Home Living                      Prior Function            PT Goals (current goals can now be found in the  care plan section) Acute Rehab PT Goals Patient Stated Goal: to get home and feel better Progress towards PT goals: Progressing toward goals    Frequency    Min 4X/week      PT Plan Current plan remains appropriate    Co-evaluation              AM-PAC PT "6 Clicks" Mobility   Outcome Measure  Help needed turning from your back to your side while in a flat bed without using bedrails?: None Help needed moving from lying on your back to sitting on the side of a flat bed without using bedrails?: None Help needed moving to and from a bed to a chair (including a wheelchair)?: A Little Help needed standing up from a chair using your arms (e.g., wheelchair or bedside chair)?: A Little Help needed to walk in hospital room?: A Little Help needed climbing 3-5 steps with a railing? : A Little 6 Click Score: 20    End of Session Equipment Utilized During Treatment: Gait belt Activity Tolerance: Patient tolerated treatment well Patient left: in bed;with call bell/phone within reach;Other (comment)(LLE up on pillows) Nurse Communication: Mobility status PT Visit Diagnosis: Unsteadiness on feet (R26.81);Other (comment);Pain Pain - Right/Left: Left Pain - part of body: Ankle and joints of foot     Time: [id number] PT Time Calculation (min) (ACUTE ONLY): 31 min  Charges:  $Gait Training: 8-22 mins $Therapeutic Exercise: 8-22 mins                  Ivar DrapeRuth E Dylon Correa 10/11/2018, 11:29 AM  Samul Dadauth Chisom Muntean, PT MS Acute Rehab Dept. Number: [facility] [phone] and [facility] [phone]

## 2018-10-11 NOTE — Progress Notes (Signed)
DISCHARGE NOTE HOME Jon House to be discharged Home per MD order. Discussed prescriptions and follow up appointments with the patient. Prescriptions given to patient; medication list explained in detail. Patient verbalized understanding.  Skin clean, dry and intact without evidence of skin break down, no evidence of skin tears noted. IV catheter discontinued intact. Site without signs and symptoms of complications. Dressing and pressure applied. Pt denies pain at the site currently. No complaints noted.  Patient free of lines, drains, and wounds.   An After Visit Summary (AVS) was printed and given to the patient. Patient escorted via wheelchair, and discharged home via private auto.  Paulla Fore, RN

## 2018-10-16 ENCOUNTER — Encounter: Payer: Self-pay | Admitting: Orthopedic Surgery

## 2018-10-16 ENCOUNTER — Other Ambulatory Visit: Payer: Self-pay

## 2018-10-16 ENCOUNTER — Ambulatory Visit (INDEPENDENT_AMBULATORY_CARE_PROVIDER_SITE_OTHER): Payer: Self-pay | Admitting: Orthopedic Surgery

## 2018-10-16 VITALS — Ht 70.98 in | Wt 165.6 lb

## 2018-10-16 DIAGNOSIS — Z89422 Acquired absence of other left toe(s): Secondary | ICD-10-CM | POA: Insufficient documentation

## 2018-10-16 NOTE — Progress Notes (Signed)
   Post-Op Visit Note   Patient: Jon House           Date of Birth: May 08, 1970           MRN: [id number] Visit Date: 10/16/2018 PCP: Antony Blackbird, MD  Chief Complaint:  Chief Complaint  Patient presents with  . Left Foot - Routine Post Op    10/09/2018 left foot 2nd &3rd     HPI:  HPI The patient is a 48 today for left second toe amputation.  He is nonweightbearing with a walker.  Surgical dressing in place. Ortho Exam Incision well approximated sutures there is no gaping no drainage no erythema minimal swelling no sign of infection.  Visit Diagnoses:  1. History of amputation of lesser toe of left foot (HCC)     Plan: Begin daily Dial soap cleansing and dry dressing changes continue nonweightbearing he will follow-up in 2 weeks for suture removal.  Interpreter during the visit.  Follow-Up Instructions: No follow-ups on file.   Imaging: No results found.  Orders:  No orders of the defined types were placed in this encounter.  No orders of the defined types were placed in this encounter.    PMFS History: Patient Active Problem List   Diagnosis Date Noted  . History of amputation of lesser toe of left foot ([place]) 10/16/2018  . Diabetic infection of left foot ([place]) 10/04/2018  . Uncontrolled insulin dependent diabetes mellitus ([place]) 10/04/2018  . Diabetic polyneuropathy associated with type 2 diabetes mellitus ([place])    Past Medical History:  Diagnosis Date  . Diabetes mellitus without complication ([place])   . Infection 10/04/2018   LEFT FOOT  . Osteomyelitis of great toe of left foot ([place]) 07/29/2018  . Subacute osteomyelitis, left ankle and foot (HCC)     Family History  Problem Relation Age of Onset  . Diabetes Mother     Past Surgical History:  Procedure Laterality Date  . AMPUTATION Left 07/31/2018   Procedure: LEFT GREAT TOE AMPUTATION;  Surgeon: Newt Minion, MD;  Location: [facility];  Service: Orthopedics;  Laterality: Left;  . AMPUTATION Left  10/09/2018   Procedure: LEFT FOOT 2ND TOE AND POSSIBLE 3RD TOE AMPUTATION;  Surgeon: Newt Minion, MD;  Location: [facility];  Service: Orthopedics;  Laterality: Left;   Social History   Occupational History  . Not on file  Tobacco Use  . Smoking status: Former Research scientist (life sciences)  . Smokeless tobacco: Never Used  Substance and Sexual Activity  . Alcohol use: Yes    Comment: sometimes  . Drug use: Never  . Sexual activity: Not on file

## 2018-10-21 ENCOUNTER — Ambulatory Visit (INDEPENDENT_AMBULATORY_CARE_PROVIDER_SITE_OTHER): Payer: Self-pay | Admitting: Physician Assistant

## 2018-10-21 ENCOUNTER — Other Ambulatory Visit: Payer: Self-pay

## 2018-10-21 ENCOUNTER — Ambulatory Visit: Payer: Self-pay | Attending: Family Medicine | Admitting: Family Medicine

## 2018-10-21 ENCOUNTER — Encounter: Payer: Self-pay | Admitting: Orthopedic Surgery

## 2018-10-21 VITALS — Ht 70.0 in | Wt 165.0 lb

## 2018-10-21 DIAGNOSIS — E1142 Type 2 diabetes mellitus with diabetic polyneuropathy: Secondary | ICD-10-CM

## 2018-10-21 DIAGNOSIS — Z89422 Acquired absence of other left toe(s): Secondary | ICD-10-CM

## 2018-10-21 NOTE — Progress Notes (Signed)
Office Visit Note   Patient: Jon House           Date of Birth: 12-28-1970           MRN: [id number] Visit Date: 10/21/2018              Requested by: Cain SaupeFulp, Cammie, MD [address] [place],  [place] [id number] PCP: Cain SaupeFulp, Cammie, MD  Chief Complaint  Patient presents with  . Left Foot - Routine Post Op    10/08/2000 left foot 2nd and 3rd toe amputation       HPI: The patient is a 48 yo gentleman who is seen for post operative follow up following amputation of the left foot 2nd toe on 10/09/2018. He is seen today with a Spanish interpreter.  He is walking with a walker and post op shoe and non weight bearing on the left foot.  9 days post op:  He reports pain is controlled with Tylenol. He reports some phantom pain but the Tylenol is helping with this.   Assessment & Plan: Visit Diagnoses:  1. History of amputation of lesser toe of left foot (HCC)   2. Diabetic polyneuropathy associated with type 2 diabetes mellitus (HCC)     Plan: Continue to off load and elevate as much as possible. Continue non weight bearing as much as possible. Will leave sutures in place for another 2 weeks.  Follow up in 2 weeks.   Follow-Up Instructions: Return in about 2 weeks (around 11/04/2018).   Ortho Exam  Patient is alert, oriented, no adenopathy, well-dressed, normal affect, normal respiratory effort. The left foot 2nd toe amputation site is dry and intact. Minimal old dried blood over the area. No signs of infection or cellulitis. Sutures remain under moderate tension and will leave in place. Good palpable pedal pulses.   Imaging: No results found. No images are attached to the encounter.  Labs: Lab Results  Component Value Date   HGBA1C 9.6 (A) 10/02/2018   HGBA1C 11.1 (H) 07/29/2018   ESRSEDRATE 69 (H) 10/06/2018   ESRSEDRATE 74 (H) 10/05/2018   ESRSEDRATE 54 (H) 10/04/2018   CRP 13.7 (H) 10/06/2018   CRP 19.7 (H) 10/05/2018   CRP 19.7 (H) 10/04/2018   REPTSTATUS  10/09/2018 FINAL 10/04/2018   CULT  10/04/2018    NO GROWTH 5 DAYS Performed at [facility], [address] [place], [place] [address]      Lab Results  Component Value Date   ALBUMIN 3.9 10/03/2018   ALBUMIN 4.7 08/05/2018   ALBUMIN 3.9 07/29/2018   PREALBUMIN 9.6 (L) 10/04/2018    Body mass index is 23.68 kg/m.  Orders:  No orders of the defined types were placed in this encounter.  No orders of the defined types were placed in this encounter.    Procedures: No procedures performed  Clinical Data: No additional findings.  ROS:  All other systems negative, except as noted in the HPI. Review of Systems  Objective: Vital Signs: Ht 5\' 10"  (1.778 m)   Wt 165 lb (74.8 kg)   BMI 23.68 kg/m   Specialty Comments:  No specialty comments available.  PMFS History: Patient Active Problem List   Diagnosis Date Noted  . History of amputation of lesser toe of left foot (HCC) 10/16/2018  . Diabetic infection of left foot (HCC) 10/04/2018  . Uncontrolled insulin dependent diabetes mellitus (HCC) 10/04/2018  . Diabetic polyneuropathy associated with type 2 diabetes mellitus [facility](HCC)    Past Medical  History:  Diagnosis Date  . Diabetes mellitus without complication ([place])   . Infection 10/04/2018   LEFT FOOT  . Osteomyelitis of great toe of left foot ([place]) 07/29/2018  . Subacute osteomyelitis, left ankle and foot (HCC)     Family History  Problem Relation Age of Onset  . Diabetes Mother     Past Surgical History:  Procedure Laterality Date  . AMPUTATION Left 07/31/2018   Procedure: LEFT GREAT TOE AMPUTATION;  Surgeon: Newt Minion, MD;  Location: [facility];  Service: Orthopedics;  Laterality: Left;  . AMPUTATION Left 10/09/2018   Procedure: LEFT FOOT 2ND TOE AND POSSIBLE 3RD TOE AMPUTATION;  Surgeon: Newt Minion, MD;  Location: [place];  Service: Orthopedics;  Laterality: Left;   Social History   Occupational History  . Not on file  Tobacco Use  . Smoking  status: Former Research scientist (life sciences)  . Smokeless tobacco: Never Used  Substance and Sexual Activity  . Alcohol use: Yes    Comment: sometimes  . Drug use: Never  . Sexual activity: Not on file

## 2018-11-04 ENCOUNTER — Ambulatory Visit (INDEPENDENT_AMBULATORY_CARE_PROVIDER_SITE_OTHER): Payer: Self-pay | Admitting: Physician Assistant

## 2018-11-04 ENCOUNTER — Other Ambulatory Visit: Payer: Self-pay

## 2018-11-04 ENCOUNTER — Telehealth: Payer: Self-pay

## 2018-11-04 ENCOUNTER — Encounter: Payer: Self-pay | Admitting: Orthopedic Surgery

## 2018-11-04 VITALS — Ht 70.0 in | Wt 165.0 lb

## 2018-11-04 DIAGNOSIS — Z89422 Acquired absence of other left toe(s): Secondary | ICD-10-CM

## 2018-11-04 DIAGNOSIS — Z89412 Acquired absence of left great toe: Secondary | ICD-10-CM

## 2018-11-04 DIAGNOSIS — E1142 Type 2 diabetes mellitus with diabetic polyneuropathy: Secondary | ICD-10-CM

## 2018-11-04 DIAGNOSIS — S98112A Complete traumatic amputation of left great toe, initial encounter: Secondary | ICD-10-CM

## 2018-11-04 NOTE — Progress Notes (Signed)
Office Visit Note   Patient: Jon House           Date of Birth: 1970/12/11           MRN: [id number] Visit Date: 11/04/2018              Requested by: Antony Blackbird, MD [place],  [place] [address] PCP: Antony Blackbird, MD  Chief Complaint  Patient presents with  . Left Foot - Routine Post Op    10/09/18 left foot 2nd 3rd toe amp      HPI: The patient is a 48 yo gentleman who is seen for post operative follow up following left foot second toe amputation on 10/09/2018. He previously had a great toe amputation. He has been off load and using a medical compression sock. He reports some pain over the calf but feels it is due to walking on the heel when he has to walk.  Assessment & Plan: Visit Diagnoses:  1. Amputation of left great toe ([place])   2. History of amputation of lesser toe of left foot ([place])   3. Diabetic polyneuropathy associated with type 2 diabetes mellitus ([place])     Plan: Sutures were removed today. Continue medical compression sock. Continue to elevate and off load as much as possible.  Follow up in 2 weeks.   Follow-Up Instructions: Return in about 2 weeks (around 11/18/2018).   Ortho Exam  Patient is alert, oriented, no adenopathy, well-dressed, normal affect, normal respiratory effort. The left foot second toe amputation site is healing well and without signs of infection or cellulitis. Mild residual edema of the left foot. Good palpable pedal pulses. Sutures will be harvested this visit.   Imaging: No results found. No images are attached to the encounter.  Labs: Lab Results  Component Value Date   HGBA1C 9.6 (A) 10/02/2018   HGBA1C 11.1 (H) 07/29/2018   ESRSEDRATE 69 (H) 10/06/2018   ESRSEDRATE 74 (H) 10/05/2018   ESRSEDRATE 54 (H) 10/04/2018   CRP 13.7 (H) 10/06/2018   CRP 19.7 (H) 10/05/2018   CRP 19.7 (H) 10/04/2018   REPTSTATUS 10/09/2018 FINAL 10/04/2018   CULT  10/04/2018    NO GROWTH 5 DAYS Performed at [facility], [place] [address], [place], [place] [address]      Lab Results  Component Value Date   ALBUMIN 3.9 10/03/2018   ALBUMIN 4.7 08/05/2018   ALBUMIN 3.9 07/29/2018   PREALBUMIN 9.6 (L) 10/04/2018    No results found for: MG No results found for: VD25OH  Lab Results  Component Value Date   PREALBUMIN 9.6 (L) 10/04/2018   CBC EXTENDED Latest Ref Rng & Units 10/10/2018 10/08/2018 10/06/2018  WBC 4.0 - 10.5 K/uL 9.5 5.2 5.5  RBC 4.22 - 5.81 MIL/uL 3.74(L) 4.22 4.21(L)  HGB 13.0 - 17.0 g/dL 11.2(L) 12.5(L) 12.7(L)  HCT 39.0 - 52.0 % 32.7(L) 36.7(L) 36.6(L)  PLT 150 - 400 K/uL 305 272 221  NEUTROABS 1.7 - 7.7 K/uL - - -  LYMPHSABS 0.7 - 4.0 K/uL - - -     Body mass index is 23.68 kg/m.  Orders:  No orders of the defined types were placed in this encounter.  No orders of the defined types were placed in this encounter.    Procedures: No procedures performed  Clinical Data: No additional findings.  ROS:  All other systems negative, except as noted in the HPI. Review of Systems  Objective: Vital Signs: Ht 5\' 10"  (1.778  m)   Wt 165 lb (74.8 kg)   BMI 23.68 kg/m   Specialty Comments:  No specialty comments available.  PMFS History: Patient Active Problem List   Diagnosis Date Noted  . History of amputation of lesser toe of left foot (HCC) 10/16/2018  . Diabetic infection of left foot (HCC) 10/04/2018  . Uncontrolled insulin dependent diabetes mellitus (HCC) 10/04/2018  . Diabetic polyneuropathy associated with type 2 diabetes mellitus (HCC)    Past Medical History:  Diagnosis Date  . Diabetes mellitus without complication (HCC)   . Infection 10/04/2018   LEFT FOOT  . Osteomyelitis of great toe of left foot (HCC) 07/29/2018  . Subacute osteomyelitis, left ankle and foot (HCC)     Family History  Problem Relation Age of Onset  . Diabetes Mother     Past Surgical History:  Procedure Laterality Date  . AMPUTATION Left 07/31/2018   Procedure: LEFT GREAT  TOE AMPUTATION;  Surgeon: Nadara Mustarduda, Marcus V, MD;  [facility];  Service: Orthopedics;  Laterality: Left;  . AMPUTATION Left 10/09/2018   Procedure: LEFT FOOT 2ND TOE AND POSSIBLE 3RD TOE AMPUTATION;  Surgeon: Nadara Mustarduda, Marcus V, MD;  [facility];  Service: Orthopedics;  Laterality: Left;   Social History   Occupational History  . Not on file  Tobacco Use  . Smoking status: Former Games developermoker  . Smokeless tobacco: Never Used  Substance and Sexual Activity  . Alcohol use: Yes    Comment: sometimes  . Drug use: Never  . Sexual activity: Not on file

## 2018-11-04 NOTE — Telephone Encounter (Signed)
Signed orders for home health PT faxed to [facility]

## 2018-11-18 ENCOUNTER — Encounter: Payer: Self-pay | Admitting: Orthopedic Surgery

## 2018-11-18 ENCOUNTER — Ambulatory Visit (INDEPENDENT_AMBULATORY_CARE_PROVIDER_SITE_OTHER): Payer: Self-pay | Admitting: Orthopedic Surgery

## 2018-11-18 ENCOUNTER — Other Ambulatory Visit: Payer: Self-pay

## 2018-11-18 VITALS — Ht 70.0 in | Wt 165.0 lb

## 2018-11-18 DIAGNOSIS — Z89422 Acquired absence of other left toe(s): Secondary | ICD-10-CM

## 2018-11-20 ENCOUNTER — Encounter: Payer: Self-pay | Admitting: Orthopedic Surgery

## 2018-11-20 NOTE — Progress Notes (Signed)
Office Visit Note   Patient: Jon House           Date of Birth: [birth year]           MRN: [id number] Visit Date: 11/18/2018              Requested by: Cain SaupeFulp, Cammie, MD [address] [place],  [place] [id number] PCP: Cain SaupeFulp, Cammie, MD  Chief Complaint  Patient presents with  . Left Foot - Routine Post Op    10/09/2018 left foot 2nd & 3rd      HPI: Patient is a 48 year old gentleman who presents status post lesser toe amputations left foot.  Patient is 6 weeks out from surgery he states he has increased swelling when walking he is concerned that his foot may be infected.  Assessment & Plan: Visit Diagnoses:  1. History of amputation of lesser toe of left foot (HCC)     Plan: Continue increase activities as tolerated no restrictions recommended compression stockings.  Follow-Up Instructions: Return if symptoms worsen or fail to improve.   Ortho Exam  Patient is alert, oriented, no adenopathy, well-dressed, normal affect, normal respiratory effort. Examination patient's incision is well-healed there is no redness no warmth no cellulitis there is good pulses good dorsiflexion of the ankle.  Imaging: No results found. No images are attached to the encounter.  Labs: Lab Results  Component Value Date   HGBA1C 9.6 (A) 10/02/2018   HGBA1C 11.1 (H) 07/29/2018   ESRSEDRATE 69 (H) 10/06/2018   ESRSEDRATE 74 (H) 10/05/2018   ESRSEDRATE 54 (H) 10/04/2018   CRP 13.7 (H) 10/06/2018   CRP 19.7 (H) 10/05/2018   CRP 19.7 (H) 10/04/2018   REPTSTATUS 10/09/2018 FINAL 10/04/2018   CULT  10/04/2018    NO GROWTH 5 DAYS Performed at [facility], [address] [place], [place] [address]      Lab Results  Component Value Date   ALBUMIN 3.9 10/03/2018   ALBUMIN 4.7 08/05/2018   ALBUMIN 3.9 07/29/2018   PREALBUMIN 9.6 (L) 10/04/2018    No results found for: MG No results found for: VD25OH  Lab Results  Component Value Date   PREALBUMIN 9.6 (L)  10/04/2018   CBC EXTENDED Latest Ref Rng & Units 10/10/2018 10/08/2018 10/06/2018  WBC 4.0 - 10.5 K/uL 9.5 5.2 5.5  RBC 4.22 - 5.81 MIL/uL 3.74(L) 4.22 4.21(L)  HGB 13.0 - 17.0 g/dL 11.2(L) 12.5(L) 12.7(L)  HCT 39.0 - 52.0 % 32.7(L) 36.7(L) 36.6(L)  PLT 150 - 400 K/uL 305 272 221  NEUTROABS 1.7 - 7.7 K/uL - - -  LYMPHSABS 0.7 - 4.0 K/uL - - -     Body mass index is 23.68 kg/m.  Orders:  No orders of the defined types were placed in this encounter.  No orders of the defined types were placed in this encounter.    Procedures: No procedures performed  Clinical Data: No additional findings.  ROS:  All other systems negative, except as noted in the HPI. Review of Systems  Objective: Vital Signs: Ht 5\' 10"  (1.778 m)   Wt 165 lb (74.8 kg)   BMI 23.68 kg/m   Specialty Comments:  No specialty comments available.  PMFS History: Patient Active Problem List   Diagnosis Date Noted  . History of amputation of lesser toe of left foot (HCC) 10/16/2018  . Diabetic infection of left foot (HCC) 10/04/2018  . Uncontrolled insulin dependent diabetes mellitus (HCC) 10/04/2018  . Diabetic polyneuropathy associated with type 2  diabetes mellitus ([place])    Past Medical History:  Diagnosis Date  . Diabetes mellitus without complication ([place])   . Infection 10/04/2018   LEFT FOOT  . Osteomyelitis of great toe of left foot ([place]) 07/29/2018  . Subacute osteomyelitis, left ankle and foot (HCC)     Family History  Problem Relation Age of Onset  . Diabetes Mother     Past Surgical History:  Procedure Laterality Date  . AMPUTATION Left 07/31/2018   Procedure: LEFT GREAT TOE AMPUTATION;  Surgeon: Newt Minion, MD;  Location: [place];  Service: Orthopedics;  Laterality: Left;  . AMPUTATION Left 10/09/2018   Procedure: LEFT FOOT 2ND TOE AND POSSIBLE 3RD TOE AMPUTATION;  Surgeon: Newt Minion, MD;  Location: [facility];  Service: Orthopedics;  Laterality: Left;   Social History   Occupational  History  . Not on file  Tobacco Use  . Smoking status: Former Research scientist (life sciences)  . Smokeless tobacco: Never Used  Substance and Sexual Activity  . Alcohol use: Yes    Comment: sometimes  . Drug use: Never  . Sexual activity: Not on file

## 2018-12-20 ENCOUNTER — Telehealth: Payer: Self-pay | Admitting: *Deleted

## 2018-12-20 NOTE — Telephone Encounter (Signed)
Forms for patient Home Health Certification and plan of care was faxed to [facility] and will be sent to scan center to be scanned in patient chart.

## 2018-12-24 ENCOUNTER — Ambulatory Visit: Payer: Self-pay

## 2018-12-25 ENCOUNTER — Ambulatory Visit: Payer: Self-pay | Attending: Family Medicine

## 2018-12-25 ENCOUNTER — Other Ambulatory Visit: Payer: Self-pay

## 2019-01-08 ENCOUNTER — Other Ambulatory Visit: Payer: Self-pay

## 2019-01-08 ENCOUNTER — Ambulatory Visit: Payer: Self-pay | Attending: Family Medicine | Admitting: Family Medicine

## 2019-01-29 ENCOUNTER — Ambulatory Visit: Payer: Self-pay | Admitting: Family Medicine

## 2019-07-07 ENCOUNTER — Ambulatory Visit: Payer: Self-pay | Attending: Family | Admitting: Family

## 2019-07-07 ENCOUNTER — Other Ambulatory Visit: Payer: Self-pay

## 2019-07-07 ENCOUNTER — Ambulatory Visit (HOSPITAL_COMMUNITY)
Admission: RE | Admit: 2019-07-07 | Discharge: 2019-07-07 | Disposition: A | Discharge status: Home / Self Care | Payer: Self-pay | Source: Ambulatory Visit | Attending: Family | Admitting: Family

## 2019-07-07 ENCOUNTER — Encounter: Payer: Self-pay | Admitting: Family

## 2019-07-07 VITALS — BP 124/82 | HR 85 | Temp 97.2°F | Resp 16 | Wt 171.8 lb

## 2019-07-07 DIAGNOSIS — L97528 Non-pressure chronic ulcer of other part of left foot with other specified severity: Secondary | ICD-10-CM | POA: Insufficient documentation

## 2019-07-07 DIAGNOSIS — E11628 Type 2 diabetes mellitus with other skin complications: Secondary | ICD-10-CM

## 2019-07-07 DIAGNOSIS — E11621 Type 2 diabetes mellitus with foot ulcer: Secondary | ICD-10-CM

## 2019-07-07 DIAGNOSIS — E1165 Type 2 diabetes mellitus with hyperglycemia: Secondary | ICD-10-CM

## 2019-07-07 DIAGNOSIS — L089 Local infection of the skin and subcutaneous tissue, unspecified: Secondary | ICD-10-CM

## 2019-07-07 DIAGNOSIS — E118 Type 2 diabetes mellitus with unspecified complications: Secondary | ICD-10-CM

## 2019-07-07 DIAGNOSIS — E1142 Type 2 diabetes mellitus with diabetic polyneuropathy: Secondary | ICD-10-CM

## 2019-07-07 LAB — GLUCOSE, POCT (MANUAL RESULT ENTRY): POC Glucose: 203 mg/dl — AB (ref 70–99)

## 2019-07-07 LAB — POCT GLYCOSYLATED HEMOGLOBIN (HGB A1C): HbA1c, POC (controlled diabetic range): 10.2 % — AB (ref 0.0–7.0)

## 2019-07-07 MED ORDER — BD PEN NEEDLE MINI U/F 31G X 5 MM MISC: 99 refills | Status: DC

## 2019-07-07 MED ORDER — TRUE METRIX BLOOD GLUCOSE TEST VI STRP: ORAL_STRIP | 12 refills | Status: DC

## 2019-07-07 MED ORDER — BASAGLAR KWIKPEN 100 UNIT/ML ~~LOC~~ SOPN: 20.0000 [IU] | PEN_INJECTOR | Freq: Every day | SUBCUTANEOUS | 0 refills | Status: DC

## 2019-07-07 MED ORDER — METFORMIN HCL 1000 MG PO TABS: 1000.0000 mg | ORAL_TABLET | Freq: Two times a day (BID) | ORAL | 2 refills | Status: DC

## 2019-07-07 NOTE — Progress Notes (Signed)
Discussed medication management plan with patient today while in office.

## 2019-07-07 NOTE — Progress Notes (Signed)
I have reviewed result.

## 2019-07-07 NOTE — Patient Instructions (Addendum)
Referral to podiatry and ophthalmology. Return in 3 months for follow-up with attending physician. Informacin bsica sobre la diabetes Diabetes Basics  La diabetes (diabetes mellitus) es una enfermedad de larga duracin (crnica). Se produce cuando el cuerpo no utiliza Occupational hygienist (glucosa) que se libera de los alimentos despus de comer. La diabetes puede deberse a uno de [employer] o a ambos:  El pncreas no produce suficiente cantidad de una hormona llamada insulina.  El cuerpo no reacciona de forma normal a la insulina que produce. La insulina permite que ciertos azcares (glucosa) ingresen a las clulas del cuerpo. Esto le proporciona energa. Si tiene diabetes, los azcares no pueden ingresar a las clulas. Esto produce un aumento del nivel de Dispensing optician (hiperglucemia). Sigue estas instrucciones en tu casa: Cmo se trata la diabetes? Es posible que tenga que administrarse insulina u otros medicamentos para la diabetes todos los [place] para mantener el nivel de Location manager en la sangre equilibrado. Adminstrese los medicamentos para la diabetes todos los [place] se lo haya indicado el mdico. Haga una lista de los medicamentos para la diabetes aqu: Medicamentos para la diabetes  Nombre del medicamento: ______________________________ ? Cantidad (dosis): ________________ Mellody Drown (a.m./p.m.): _______________ Wilford Grist: ___________________________________  Micki Riley medicamento: ______________________________ ? Cantidad (dosis): ________________ Mellody Drown (a.m./p.m.): _______________ Wilford Grist: ___________________________________  Micki Riley medicamento: ______________________________ ? Cantidad (dosis): ________________ Mellody Drown (a.m./p.m.): _______________ Wilford Grist: ___________________________________ Si [place] insulina, aprender cmo aplicrsela con inyecciones. Es posible que deba ajustar la cantidad en funcin de los alimentos que coma. Haga una lista de los tipos de [place] aqu: Insulina  Tipo de insulina: ______________________________ ? Cantidad (dosis): ________________ Mellody Drown (a.m./p.m.): _______________ Wilford Grist: ___________________________________  Suezanne Jacquet: ______________________________ ? Cantidad (dosis): ________________ Mellody Drown (a.m./p.m.): _______________ Wilford Grist: ___________________________________  Suezanne Jacquet: ______________________________ ? Cantidad (dosis): ________________ Mellody Drown (a.m./p.m.): _______________ Wilford Grist: ___________________________________  Suezanne Jacquet: ______________________________ ? Cantidad (dosis): ________________ Mellody Drown (a.m./p.m.): _______________ Wilford Grist: ___________________________________  Suezanne Jacquet: ______________________________ ? Cantidad (dosis): ________________ Mellody Drown (a.m./p.m.): _______________ Wilford Grist: ___________________________________ Cmo me controlo el nivel de azcar en la sangre?  Controle sus niveles de azcar en la sangre con un medidor de glucemia segn las indicaciones del mdico. El mdico fijar los objetivos del tratamiento para usted. Generalmente, los resultados de los niveles de azcar en la sangre deben ser los siguientes:  Antes de las comidas (preprandial): de 80 a 130mg /dl (de 4,4 a 7,73mmol/l).  Despus de las comidas (posprandial): por debajo de 180mg /dl (67mmol/l).  Nivel de A1c: menos del 7%. Anote las veces que se controlar los niveles de azcar en la sangre: Controles de azcar en la sangre  Hora: _______________ Wilford Grist: ___________________________________  Mellody Drown: _______________ Notas: ___________________________________  Hora: _______________ Notas: ___________________________________  Hora: _______________ Notas: ___________________________________  Hora: _______________ Notas: ___________________________________  Hora: _______________ Notas: ___________________________________  Sander Nephew debo saber acerca del nivel bajo de azcar en la  sangre? Un nivel bajo de azcar en la sangre se denomina hipoglucemia. Este cuadro ocurre cuando el nivel de azcar en la sangre es igual o menor que 70mg /dl (3,6mmol/l). Entre los sntomas, se pueden incluir los siguientes:  Sentir: ? [place]. ? Preocupacin o nervios (ansiedad). ? Sudoracin y [employer]. ? Confusin. ? Mareos. ? Somnolencia. ? Ganas de vomitar (nuseas).  Tener: ? Latidos cardacos acelerados. ? Dolor de [place]. ? Cambios en la visin. ? Hormigueo y falta de sensibilidad (entumecimiento) alrededor de la boca, los labios o la [place]. ? Movimientos espasmdicos que no puede controlar (convulsiones).  Dificultades para hacer lo siguiente: ?  Moverse (coordinacin). ? [place]. ? Desmayos. ? Molestarse con facilidad (irritabilidad). Tratamiento del nivel bajo de azcar en la sangre Para tratar un nivel bajo de azcar en la sangre, ingiera un alimento o una bebida azucarada de inmediato. Si puede pensar con claridad y tragar de manera segura, siga la regla 15/15, que consiste en lo siguiente:  Consuma 15gramos de un hidrato de carbono de accin rpida (carbohidrato). Hable con su mdico acerca de cunto debera consumir.  Algunos hidratos de carbono de accin rpida son: ? Comprimidos de azcar (pastillas de glucosa). Consuma 3o 4pastillas de glucosa. ? De 6 a 8unidades de caramelos duros. ? De 4 a 6onzas (de 120 a ) de jugo de frutas. ? De 4 a 6onzas (de 120 a ) de refresco comn (no diettico). ? 1 cucharada (67ml) de miel o azcar.  Contrlese el nivel de azcar en la sangre despus de ingerir el hidrato de carbono.  Si el nivel de azcar en la sangre todava es igual o menor que 70mg /dl ( ), ingiera nuevamente 15gramos de un hidrato de carbono.  Si el nivel de azcar en la sangre no supera los 70mg /dl (4,0CXKG/Y) despus de 3intentos, solicite ayuda de inmediato.  Ingiera una comida o una colacin en el transcurso  de 1hora despus de que el nivel de azcar en la sangre se haya normalizado. Tratamiento del nivel muy bajo de azcar en la sangre Si el nivel de azcar en la sangre es igual o menor que 54mg /dl (54mmol/l), significa que est muy bajo (hipoglucemia grave). Esto es 1,8HUDJ/S. No espere a ver si los sntomas desaparecen. Solicite atencin mdica de inmediato. Comunquese con el servicio de emergencias de su localidad (911 en los [place]). No conduzca por sus propios medios hospital. Preguntas para hacerle al mdico  Es necesario que me rena con 1m en el cuidado de la diabetes?  Qu equipos necesitar para cuidarme en casa?  Qu medicamentos para la diabetes necesito? Cundo debo tomarlos?  Con qu frecuencia debo controlar mi nivel de azcar en la sangre?  A qu nmero puedo llamar si tengo preguntas?  Cundo es mi prxima cita con el mdico?  Dnde puedo encontrar un grupo de apoyo para las personas con diabetes? Dnde buscar ms informacin  American Diabetes Association (Asociacin Estadounidense de la Diabetes): www.diabetes.org  American Association of Diabetes Educators (Asociacin Estadounidense de Instructores para el Cuidado de la Diabetes): www.diabeteseducator.org/patient-resources Comunquese con un mdico si:  El nivel de azcar en la sangre es igual o mayor que 240mg /dl (Radio broadcast assistant) durante 2das seguidos.  Ha estado enfermo o ha tenido fiebre durante 2das o ms y no mejora.  Tiene alguno de estos problemas durante ms de 6horas: ? No puede comer ni beber. ? Siente malestar estomacal (nuseas). ? [place]omita. ? Presenta heces lquidas (diarrea). Solicite ayuda inmediatamente si:  El nivel de azcar en la sangre est por debajo de 54mg /dl (38mmol/l).  Se siente confundida.  Tiene dificultad para hacer lo siguiente: ? Pensar con claridad. ? La respiracin. Resumen  La diabetes (diabetes mellitus) es una enfermedad de  larga duracin (crnica). Se produce cuando el cuerpo no utiliza IT trainer (glucosa) que se libera de los alimentos despus de la digestin.  Aplquese la insulina y tome los medicamentos para la diabetes como se lo hayan indicado.  Contrlese el nivel de azcar en la sangre todos los [place], con la frecuencia que le hayan indicado.  Concurra a todas las visitas de 97,0YOVZ/CH se lo  haya indicado el mdico. Esto es importante. Esta informacin no tiene Theme park manager el consejo del mdico. Asegrese de hacerle al mdico cualquier pregunta que tenga. Document Revised: 06/12/2018 Document Reviewed: 08/24/2017 Elsevier Patient Education  2020 [employer].

## 2019-07-07 NOTE — Progress Notes (Addendum)
Patient ID: Jon House, male    DOB: 1970-09-19  MRN: [id number]  CC: Diabetes and Wound Check (left foot )  Subjective: Jon House is a 49 y.o. male with history of diabetic polyneuropathy associated with type 2 diabetes mellitus, diabetic infection of left foot, uncontrolled insulin-dependent diabetes mellitus, and history of amputation of lesser toe of left foot who presents for left foot diabetic ulcer. [address], Jon House ID# [id number], participated in this visit.  1. DIABETES TYPE 2 FOLLOW-UP:  Last A1C: 10.2, March 2021 Results for orders placed or performed in visit on 07/07/19  POCT glucose (manual entry)  Result Value Ref Range   POC Glucose 203 (A) 70 - 99 mg/dl  POCT glycosylated hemoglobin (Hb A1C)  Result Value Ref Range   Hemoglobin A1C     HbA1c POC (<> result, manual entry)     HbA1c, POC (prediabetic range)     HbA1c, POC (controlled diabetic range) 10.2 (A) 0.0 - 7.0 %    Med Adherence:  _0  Yes    _1  No Medication side effects:  _2  Yes, Metformin causes frequent bowel movements. Home Monitoring?  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 _11  Yes, left foot Retinopathy hx? _12  Yes    _13  No Last eye exam: Denies    Comments: 1 month ago he noticed a callus with a black spot in the center. Over the course of time the callous and black spot kept growing bigger and becoming more painful, rating 7/10.  Denies taking any medication for pain.  During the same time about 1 month ago patient started digging in foot with a nail cutter. Had yellow drainage with no smell. Says wearing compression stocking as needed. Not taking Metformin or Insulin Glargine since August 2020 because he ran out of insulin. Still taking Insulin  Aspart Protamine-Aspart 70/30 without missing doses. Denies health insurance. Has orange card since June 2020. Denies chest pain, palpitations, and shortness of breath.     Patient Active Problem List   Diagnosis Date Noted  . History of amputation of lesser toe of left foot ([place]) 10/16/2018  . Diabetic infection of left foot ([place]) 10/04/2018  . Uncontrolled insulin dependent diabetes mellitus 10/04/2018  . Diabetic polyneuropathy associated with type 2 diabetes mellitus ([place])      Current Outpatient Medications on File Prior to Visit  Medication Sig Dispense Refill  . Blood Glucose Monitoring Suppl (TRUE METRIX METER) w/Device KIT Use to check blood sugars up to  3 times daily 1 kit 0  . docusate sodium (COLACE) 100 MG capsule Take 1 capsule (100 mg total) by mouth 2 (two) times daily. 10 capsule 0  . glucose blood (TRUE METRIX BLOOD GLUCOSE TEST) test strip Use as instructed to check blood sugars up to 3 x daily 100 each 12  . insulin aspart protamine- aspart (NOVOLOG MIX 70/30) (70-30) 100 UNIT/ML injection Inject 0.1 mLs (10 Units total) into the skin 2 (two) times daily with a meal. 12 mL 5  . Insulin Glargine (BASAGLAR KWIKPEN) 100 UNIT/ML SOPN Inject 0.2 mLs (20 Units total) into the skin at bedtime. 5 pen 0  . Insulin Pen Needle (B-D UF III MINI PEN  NEEDLES) 31G X 5 MM MISC Use as instructed. Inject into the skin once nightly. 100 each prn  . metFORMIN (GLUCOPHAGE) 1000 MG tablet Take 1 tablet (1,000 mg total) by mouth 2 (two) times daily with a meal. (Patient taking differently: Take 800 mg by mouth 2 (two) times daily with a meal. ) 180 tablet 1  . oxyCODONE (OXY IR/ROXICODONE) 5 MG immediate release tablet Take 1 tablet (5 mg total) by mouth every 6 (six) hours as needed for moderate pain. (Patient not taking: Reported on 07/07/2019) 28 tablet 0  . TRUEplus Lancets 28G MISC Use to help check blood sugars up to 3 times per day 100 each 11   No current facility-administered medications  on file prior to visit.    Allergies  Allergen Reactions  . Bee Venom Hives    Social History   Socioeconomic History  . Marital status: Significant Other    Spouse name: Not on file  . Number of children: Not on file  . Years of education: Not on file  . Highest education level: Not on file  Occupational History  . Not on file  Tobacco Use  . Smoking status: Former Research scientist (life sciences)  . Smokeless tobacco: Never Used  Substance and Sexual Activity  . Alcohol use: Yes    Comment: sometimes  . Drug use: Never  . Sexual activity: Not on file  Other Topics Concern  . Not on file  Social History Narrative  . Not on file   Social Determinants of Health   Financial Resource Strain:   . Difficulty of Paying Living Expenses: Not on file  Food Insecurity:   . Worried About Charity fundraiser in the Last Year: Not on file  . Ran Out of Food in the Last Year: Not on file  Transportation Needs:   . Lack of Transportation (Medical): Not on file  . Lack of Transportation (Non-Medical): Not on file  Physical Activity:   . Days of Exercise per Week: Not on file  . Minutes of Exercise per Session: Not on file  Stress:   . Feeling of Stress : Not on file  Social Connections:   . Frequency of Communication with Friends and Family: Not on file  . Frequency of Social Gatherings with Friends and Family: Not on file  . Attends Religious Services: Not on file  . Active Member of Clubs or Organizations: Not on file  . Attends Archivist Meetings: Not on file  . Marital Status: Not on file  Intimate Partner Violence:   . Fear of Current or Ex-Partner: Not on file  . Emotionally Abused: Not on file  . Physically Abused: Not on file  . Sexually Abused: Not on file    Family History  Problem Relation Age of Onset  . Diabetes Mother     Past Surgical History:  Procedure Laterality Date  . AMPUTATION Left 07/31/2018   Procedure: LEFT GREAT TOE AMPUTATION;  Surgeon: Newt Minion,  MD;  Location: [facility];  Service: Orthopedics;  Laterality: Left;  . AMPUTATION Left 10/09/2018   Procedure: LEFT FOOT 2ND TOE AND POSSIBLE 3RD TOE AMPUTATION;  Surgeon: Newt Minion, MD;  Location: [facility];  Service: Orthopedics;  Laterality: Left;    ROS: Review of Systems Negative except as stated above  PHYSICAL EXAM: Temp (!) 97.2 F (36.2 C)   Wt 171 lb 12.8 oz (77.9 kg)   BMI 24.65 kg/m   Physical Exam General appearance - alert, well  appearing, and in no distress and oriented to person, place, and time Mental status - alert, oriented to person, place, and time, normal mood, behavior, speech, dress, motor activity, and thought processes Eyes - pupils equal and reactive, extraocular eye movements intact, funduscopic exam normal, discs flat and sharp Chest - clear to auscultation, no wheezes, rales or rhonchi, symmetric air entry, no tachypnea, retractions or cyanosis Heart - normal rate, regular rhythm, normal S1, S2, no murmurs, rubs, clicks or gallops Neurological - alert, oriented, normal speech, no focal findings or movement disorder noted, screening mental status exam normal, neck supple without rigidity, cranial nerves II through XII intact, funduscopic exam normal, discs flat and sharp, DTR's normal and symmetric, normal muscle tone, no tremors, strength 5/5, Romberg sign negative, normal gait and station, decreased sensation left foot  Musculoskeletal - no joint tenderness, deformity or swelling, no muscular tenderness noted, full range of motion without pain Extremities - peripheral pulses normal, 2+ pulse left foot, 3+ pulse right foot, no pedal edema, no clubbing or cyanosis, normal color, temperature and sensation (right foot only), monofilament sensory exam is normal in right foot; monofilament sensory exam decreased sensation left foot dorsum and sole of forefoot/toes  Skin - normal coloration and turgor, no rashes, no suspicious skin lesions noted  CMP Latest Ref Rng &  Units 10/10/2018 10/08/2018 10/06/2018  Glucose 70 - 99 mg/dL 198(H) 241(H) 331(H)  BUN 6 - 20 mg/dL _0 Creatinine 0.61 - 1.24 mg/dL 0.65 0.72 0.68  Sodium 135 - 145 mmol/L 138 137 136  Potassium 3.5 - 5.1 mmol/L 3.7 3.9 4.3  Chloride 98 - 111 mmol/L 104 99 99  CO2 22 - 32 mmol/L _1 Calcium 8.9 - 10.3 mg/dL 8.4(L) 8.8(L) 8.9  Total Protein 6.5 - 8.1 g/dL - - -  Total Bilirubin 0.3 - 1.2 mg/dL - - -  Alkaline Phos 38 - 126 U/L - - -  AST 15 - 41 U/L - - -  ALT 0 - 44 U/L - - -   Lipid Panel     Component Value Date/Time   CHOL 168 10/02/2018 1016   TRIG 89 10/02/2018 1016   HDL 42 10/02/2018 1016   CHOLHDL 4.0 10/02/2018 1016   LDLCALC 108 (H) 10/02/2018 1016    CBC    Component Value Date/Time   WBC 9.5 10/10/2018 0648   RBC 3.74 (L) 10/10/2018 0648   HGB 11.2 (L) 10/10/2018 0648   HGB 14.1 10/02/2018 1016   HCT 32.7 (L) 10/10/2018 0648   HCT 41.3 10/02/2018 1016   PLT 305 10/10/2018 0648   PLT 200 10/02/2018 1016   MCV 87.4 10/10/2018 0648   MCV 88 10/02/2018 1016   MCH 29.9 10/10/2018 0648   MCHC 34.3 10/10/2018 0648   RDW 11.9 10/10/2018 0648   RDW 12.9 10/02/2018 1016   LYMPHSABS 0.6 (L) 10/04/2018 0328   LYMPHSABS 1.7 08/05/2018 1133   MONOABS 0.7 10/04/2018 0328   EOSABS 0.0 10/04/2018 0328   EOSABS 0.1 08/05/2018 1133   BASOSABS 0.0 10/04/2018 0328   BASOSABS 0.0 08/05/2018 1133   Results for orders placed or performed in visit on 07/07/19  POCT glucose (manual entry)  Result Value Ref Range   POC Glucose 203 (A) 70 - 99 mg/dl  POCT glycosylated hemoglobin (Hb A1C)  Result Value Ref Range   Hemoglobin A1C     HbA1c POC (<> result, manual entry)     HbA1c, POC (prediabetic range)  HbA1c, POC (controlled diabetic range) 10.2 (A) 0.0 - 7.0 %   ASSESSMENT AND PLAN: 1. Poorly controlled type 2 diabetes mellitus with complication [facility]): - POCT glucose (manual entry) - POCT glycosylated hemoglobin (Hb A1C) - glucose blood (TRUE METRIX BLOOD  GLUCOSE TEST) test strip; Use as instructed to check blood sugars up to 3 x daily  Dispense: 100 each; Refill: 12 - Insulin Glargine (BASAGLAR KWIKPEN) 100 UNIT/ML; Inject 0.2 mLs (20 Units total) into the skin at bedtime.  Dispense: 5 pen; Refill: 0 - Insulin Pen Needle (B-D UF III MINI PEN NEEDLES) 31G X 5 MM MISC; Use as instructed. Inject into the skin once nightly.  Dispense: 100 each; Refill: prn - Microalbumin / creatinine urine ratio - Ambulatory referral to Ophthalmology -Discussed the importance of healthy eating habits, low-carbohydrate diet, low-sugar diet, regular aerobic exercise (at least 150 minutes a week as tolerated) and medication compliance to achieve or maintain control of diabetes. -Check blood sugars before meals and record results. -To achieve an A1C goal of less than or equal to 7.0 percent, a fasting blood sugar of 80 to 130 mg/dL and a postprandial glucose (90 to 120 minutes after a meal) less than 180 mg/dL. In the event of sugars less than 60 mg/dl or greater than 400 mg/dl please notify the clinic ASAP. It is recommended that you undergo annual eye exams and annual foot exams. -Follow-up in 3 months with attending physician for chronic disease management  2. Diabetic polyneuropathy associated with type 2 diabetes mellitus ([place]): - metFORMIN (GLUCOPHAGE) 1000 MG tablet; Take 1 tablet (1,000 mg total) by mouth 2 (two) times daily with a meal.  Dispense: 60 tablet; Refill: 2 -See # 1 above -Follow-up in 3 months with attending physician for chronic disease management.  3. Diabetic infection of left foot ([place]): - Sedimentation Rate - DG Foot Complete Left; Future - Ambulatory referral to [place] patient to apply for [facility] financial discount and to make an appointment with financial counselor  Patient was given the opportunity to ask questions.  Patient verbalized understanding of the plan and was able to repeat key elements of the plan. Patient was  given clear instructions to go to Emergency Department or return to medical center if symptoms don't improve, worsen, or new problems develop.The patient verbalized understanding.  Orders Placed This Encounter  Procedures  . POCT glucose (manual entry)  . POCT glycosylated hemoglobin (Hb A1C)     Requested Prescriptions    No prescriptions requested or ordered in this encounter    Hermela Hardt Zachery Dauer, NP

## 2019-07-08 ENCOUNTER — Telehealth: Payer: Self-pay

## 2019-07-08 LAB — MICROALBUMIN / CREATININE URINE RATIO
Creatinine, Urine: 147.6 mg/dL
Microalb/Creat Ratio: 33 mg/g creat — ABNORMAL HIGH (ref 0–29)
Microalbumin, Urine: 48.1 ug/mL

## 2019-07-08 LAB — SEDIMENTATION RATE: Sed Rate: 3 mm/hr (ref 0–30)

## 2019-07-08 NOTE — Telephone Encounter (Signed)
[employer] aaron [phone]   contacted pt to go over lab/xray results pt is aware and doesn't have any questions or concerns

## 2019-07-08 NOTE — Progress Notes (Signed)
Microalbumin/creatinine ratio elevated. Will reassess at follow-up visit.

## 2019-07-08 NOTE — Progress Notes (Signed)
Sed rate normal. Awaiting microalbumin/creatinine ratio results.

## 2019-07-22 ENCOUNTER — Other Ambulatory Visit: Payer: Self-pay

## 2019-07-22 ENCOUNTER — Ambulatory Visit (INDEPENDENT_AMBULATORY_CARE_PROVIDER_SITE_OTHER): Payer: Self-pay | Admitting: Physician Assistant

## 2019-07-22 ENCOUNTER — Encounter: Payer: Self-pay | Admitting: Physician Assistant

## 2019-07-22 VITALS — Ht 70.0 in | Wt 171.0 lb

## 2019-07-22 DIAGNOSIS — Z89422 Acquired absence of other left toe(s): Secondary | ICD-10-CM

## 2019-07-22 NOTE — Progress Notes (Signed)
Office Visit Note   Patient: Jon House           Date of Birth: 02-Apr-1971           MRN: [id number] Visit Date: 07/22/2019              Requested by: Antony Blackbird, MD [place],  [place] [address] PCP: Antony Blackbird, MD  Chief Complaint  Patient presents with  . Left Foot - Pain      HPI: This is a pleasant 49 year old gentleman who is accompanied by an interpreter.  He is status post great toe and second toe amputation on the left.  He has had about a 15-month history of a callus beneath his first metatarsal head.  He states about a month ago it did have some drainage and mild foul odor.  He did have x-rays which were negative for osteomyelitis.  He was placed on antibiotic and has had no drainage or foul odor recently.  He does look at his feet daily.  He only has a very small area that is open with a callus  Assessment & Plan: Visit Diagnoses: No diagnosis found.  Plan: I have made him a doughnut to try in his shoe to relieve pressure.  I think also he would benefit from Achilles stretching and I demonstrated this to him today.  He will follow-up in 1 month.  If he has a return of any drainage or redness he should follow-up immediately  Follow-Up Instructions: No follow-ups on file.   Ortho Exam  Patient is alert, oriented, no adenopathy, well-dressed, normal affect, normal respiratory effort. Focused examination demonstrates previous amputation incision well-healed.  No cellulitis no redness no swelling he does have a callus beneath the first metatarsal head that has a pinpoint central opening.  There is no drainage just a little bit of dried blood.  There is no foul odor does not probe deeply.  After obtaining verbal consent the callus was debrided to softer skin.  No evidence of any infection at this time  Imaging: No results found. No images are attached to the encounter.  Labs: Lab Results  Component Value Date   HGBA1C 10.2 (A) 07/07/2019   HGBA1C 9.6 (A) 10/02/2018   HGBA1C 11.1 (H) 07/29/2018   ESRSEDRATE 3 07/07/2019   ESRSEDRATE 69 (H) 10/06/2018   ESRSEDRATE 74 (H) 10/05/2018   CRP 13.7 (H) 10/06/2018   CRP 19.7 (H) 10/05/2018   CRP 19.7 (H) 10/04/2018   REPTSTATUS 10/09/2018 FINAL 10/04/2018   CULT  10/04/2018    NO GROWTH 5 DAYS Performed at [facility], [place] [address], [place], [place] [address]      Lab Results  Component Value Date   ALBUMIN 3.9 10/03/2018   ALBUMIN 4.7 08/05/2018   ALBUMIN 3.9 07/29/2018   PREALBUMIN 9.6 (L) 10/04/2018    No results found for: MG No results found for: VD25OH  Lab Results  Component Value Date   PREALBUMIN 9.6 (L) 10/04/2018   CBC EXTENDED Latest Ref Rng & Units 10/10/2018 10/08/2018 10/06/2018  WBC 4.0 - 10.5 K/uL 9.5 5.2 5.5  RBC 4.22 - 5.81 MIL/uL 3.74(L) 4.22 4.21(L)  HGB 13.0 - 17.0 g/dL 11.2(L) 12.5(L) 12.7(L)  HCT 39.0 - 52.0 % 32.7(L) 36.7(L) 36.6(L)  PLT 150 - 400 K/uL 305 272 221  NEUTROABS 1.7 - 7.7 K/uL - - -  LYMPHSABS 0.7 - 4.0 K/uL - - -     Body mass index is 24.54  kg/m.  Orders:  No orders of the defined types were placed in this encounter.  No orders of the defined types were placed in this encounter.    Procedures: No procedures performed  Clinical Data: No additional findings.  ROS:  All other systems negative, except as noted in the HPI. Review of Systems  Objective: Vital Signs: Ht 5\' 10"  (1.778 m)   Wt 171 lb (77.6 kg)   BMI 24.54 kg/m   Specialty Comments:  No specialty comments available.  PMFS History: Patient Active Problem List   Diagnosis Date Noted  . History of amputation of lesser toe of left foot (HCC) 10/16/2018  . Diabetic infection of left foot (HCC) 10/04/2018  . Uncontrolled insulin dependent diabetes mellitus 10/04/2018  . Diabetic polyneuropathy associated with type 2 diabetes mellitus (HCC)    Past Medical History:  Diagnosis Date  . Diabetes mellitus without complication (HCC)   .  Infection 10/04/2018   LEFT FOOT  . Osteomyelitis of great toe of left foot (HCC) 07/29/2018  . Subacute osteomyelitis, left ankle and foot (HCC)     Family History  Problem Relation Age of Onset  . Diabetes Mother     Past Surgical History:  Procedure Laterality Date  . AMPUTATION Left 07/31/2018   Procedure: LEFT GREAT TOE AMPUTATION;  Surgeon: 09/30/2018, MD;  Location: [facility] OR;  Service: Orthopedics;  Laterality: Left;  . AMPUTATION Left 10/09/2018   Procedure: LEFT FOOT 2ND TOE AND POSSIBLE 3RD TOE AMPUTATION;  Surgeon: 12/09/2018, MD;  Location: [facility] OR;  Service: Orthopedics;  Laterality: Left;   Social History   Occupational History  . Not on file  Tobacco Use  . Smoking status: Former [facility]  . Smokeless tobacco: Never Used  Substance and Sexual Activity  . Alcohol use: Yes    Comment: sometimes  . Drug use: Never  . Sexual activity: Not on file

## 2019-07-23 ENCOUNTER — Ambulatory Visit: Payer: Self-pay | Admitting: Family Medicine

## 2019-08-19 ENCOUNTER — Ambulatory Visit: Payer: Self-pay | Admitting: Physician Assistant

## 2019-08-20 ENCOUNTER — Ambulatory Visit: Payer: Self-pay

## 2019-09-02 ENCOUNTER — Ambulatory Visit: Payer: Self-pay

## 2019-09-02 ENCOUNTER — Other Ambulatory Visit: Payer: Self-pay

## 2019-10-08 ENCOUNTER — Ambulatory Visit: Payer: Self-pay | Admitting: Family Medicine

## 2020-01-27 ENCOUNTER — Emergency Department (HOSPITAL_COMMUNITY)
Admission: EM | Admit: 2020-01-27 | Discharge: 2020-01-27 | Disposition: A | Discharge status: Home / Self Care | Payer: Self-pay | Attending: Emergency Medicine | Admitting: Emergency Medicine

## 2020-01-27 ENCOUNTER — Encounter (HOSPITAL_COMMUNITY): Payer: Self-pay | Admitting: Emergency Medicine

## 2020-01-27 ENCOUNTER — Emergency Department (HOSPITAL_COMMUNITY): Payer: Self-pay

## 2020-01-27 DIAGNOSIS — T8189XA Other complications of procedures, not elsewhere classified, initial encounter: Secondary | ICD-10-CM | POA: Insufficient documentation

## 2020-01-27 DIAGNOSIS — E1140 Type 2 diabetes mellitus with diabetic neuropathy, unspecified: Secondary | ICD-10-CM | POA: Insufficient documentation

## 2020-01-27 DIAGNOSIS — L24A9 Irritant contact dermatitis due friction or contact with other specified body fluids: Secondary | ICD-10-CM

## 2020-01-27 DIAGNOSIS — Z87891 Personal history of nicotine dependence: Secondary | ICD-10-CM | POA: Insufficient documentation

## 2020-01-27 DIAGNOSIS — Z794 Long term (current) use of insulin: Secondary | ICD-10-CM | POA: Insufficient documentation

## 2020-01-27 LAB — BASIC METABOLIC PANEL
Anion gap: 10 (ref 5–15)
BUN: 14 mg/dL (ref 6–20)
CO2: 25 mmol/L (ref 22–32)
Calcium: 9.1 mg/dL (ref 8.9–10.3)
Chloride: 100 mmol/L (ref 98–111)
Creatinine, Ser: 0.75 mg/dL (ref 0.61–1.24)
GFR calc Af Amer: >60 mL/min (ref >60)
GFR calc non Af Amer: >60 mL/min (ref >60)
Glucose, Bld: 321 mg/dL — ABNORMAL HIGH (ref 70–99)
Potassium: 4.4 mmol/L (ref 3.5–5.1)
Sodium: 135 mmol/L (ref 135–145)

## 2020-01-27 LAB — CBC
HCT: 41.4 % (ref 39.0–52.0)
Hemoglobin: 13.8 g/dL (ref 13.0–17.0)
MCH: 29.4 pg (ref 26.0–34.0)
MCHC: 33.3 g/dL (ref 30.0–36.0)
MCV: 88.1 fL (ref 80.0–100.0)
Platelets: 343 10*3/uL (ref 150–400)
RBC: 4.7 MIL/uL (ref 4.22–5.81)
RDW: 11.8 % (ref 11.5–15.5)
WBC: 5.8 10*3/uL (ref 4.0–10.5)
nRBC: 0 % (ref 0.0–0.2)

## 2020-01-27 MED ORDER — DOXYCYCLINE HYCLATE 100 MG PO TABS: 100.0000 mg | ORAL_TABLET | Freq: Two times a day (BID) | ORAL | 0 refills | Status: DC

## 2020-01-27 NOTE — ED Provider Notes (Signed)
[facility] EMERGENCY DEPARTMENT Provider Note   CSN: [id number] Arrival date & time: 01/27/20  3546     History Chief Complaint  Patient presents with  . Toe Injury    Jon House is a 49 y.o. male.  Pt complains of infection in his right 1st toe.  Pt is diabetic.  Pt has had previous toe amputation on left by Dr. Sharol Given.  Pt stepped on a nail 3 weeks ago,  Pt reports now toe is dark.    The history is provided by the patient. No language interpreter was used.  Toe Pain This is a new problem. The problem occurs constantly. Nothing aggravates the symptoms. Nothing relieves the symptoms. He has tried nothing for the symptoms. The treatment provided no relief.       Past Medical History:  Diagnosis Date  . Diabetes mellitus without complication ([place])   . Infection 10/04/2018   LEFT FOOT  . Osteomyelitis of great toe of left foot ([place]) 07/29/2018  . Subacute osteomyelitis, left ankle and foot [facility])     Patient Active Problem List   Diagnosis Date Noted  . History of amputation of lesser toe of left foot ([place]) 10/16/2018  . Diabetic infection of left foot ([place]) 10/04/2018  . Uncontrolled insulin dependent diabetes mellitus 10/04/2018  . Diabetic polyneuropathy associated with type 2 diabetes mellitus [facility])     Past Surgical History:  Procedure Laterality Date  . AMPUTATION Left 07/31/2018   Procedure: LEFT GREAT TOE AMPUTATION;  Surgeon: Newt Minion, MD;  Location: [place];  Service: Orthopedics;  Laterality: Left;  . AMPUTATION Left 10/09/2018   Procedure: LEFT FOOT 2ND TOE AND POSSIBLE 3RD TOE AMPUTATION;  Surgeon: Newt Minion, MD;  Location: [place];  Service: Orthopedics;  Laterality: Left;       Family History  Problem Relation Age of Onset  . Diabetes Mother     Social History   Tobacco Use  . Smoking status: Former Research scientist (life sciences)  . Smokeless tobacco: Never Used  Vaping Use  . Vaping Use: Never used  Substance Use Topics  . Alcohol  use: Yes    Comment: sometimes  . Drug use: Never    Home Medications Prior to Admission medications   Medication Sig Start Date End Date Taking? Authorizing Provider  Blood Glucose Monitoring Suppl (TRUE METRIX METER) w/Device KIT Use to check blood sugars up to  3 times daily 08/05/18   Fulp, Cammie, MD  docusate sodium (COLACE) 100 MG capsule Take 1 capsule (100 mg total) by mouth 2 (two) times daily. 10/11/18   Mariel Aloe, MD  glucose blood (TRUE METRIX BLOOD GLUCOSE TEST) test strip Use as instructed to check blood sugars up to 3 x daily 07/07/19   Camillia Herter, NP  insulin aspart protamine- aspart (NOVOLOG MIX 70/30) (70-30) 100 UNIT/ML injection Inject 0.1 mLs (10 Units total) into the skin 2 (two) times daily with a meal. 08/05/18   Fulp, Cammie, MD  Insulin Glargine (BASAGLAR KWIKPEN) 100 UNIT/ML Inject 0.2 mLs (20 Units total) into the skin at bedtime. 07/07/19   Camillia Herter, NP  Insulin Pen Needle (B-D UF III MINI PEN NEEDLES) 31G X 5 MM MISC Use as instructed. Inject into the skin once nightly. 07/07/19   Camillia Herter, NP  metFORMIN (GLUCOPHAGE) 1000 MG tablet Take 1 tablet (1,000 mg total) by mouth 2 (two) times daily with a meal. 07/07/19 10/05/19  Camillia Herter, NP  oxyCODONE (OXY  IR/ROXICODONE) 5 MG immediate release tablet Take 1 tablet (5 mg total) by mouth every 6 (six) hours as needed for moderate pain. 08/05/18   Fulp, Cammie, MD  TRUEplus Lancets 28G MISC Use to help check blood sugars up to 3 times per day 08/05/18   Antony Blackbird, MD    Allergies    Bee venom  Review of Systems   Review of Systems  Constitutional: Negative for fever.  Musculoskeletal: Positive for joint swelling. Negative for myalgias.  All other systems reviewed and are negative.   Physical Exam Updated Vital Signs BP 105/69 (BP Location: Right Arm)   Pulse 88   Temp 99 F (37.2 C) (Oral)   Resp 14   SpO2 100%   Physical Exam Vitals and nursing note reviewed.  Constitutional:       Appearance: He is well-developed.  HENT:     Head: Normocephalic.  Cardiovascular:     Rate and Rhythm: Normal rate.  Pulmonary:     Effort: Pulmonary effort is normal.  Abdominal:     General: There is no distension.  Musculoskeletal:        General: Swelling and tenderness present. Normal range of motion.     Cervical back: Normal range of motion.     Comments: Swollen right 1st toe,  Dark area base of toe,   Left 1st toe amputated,  Ulcer base of food   Skin:    General: Skin is warm.  Neurological:     General: No focal deficit present.     Mental Status: He is alert and oriented to person, place, and time.  Psychiatric:        Mood and Affect: Mood normal.     ED Results / Procedures / Treatments   Labs (all labs ordered are listed, but only abnormal results are displayed) Labs Reviewed  BASIC METABOLIC PANEL - Abnormal; Notable for the following components:      Result Value   Glucose, Bld 321 (*)    All other components within normal limits  CBC    EKG None  Radiology DG Foot Complete Right  Result Date: 01/27/2020 CLINICAL DATA:  Stepped on a nail 2-3 weeks ago, developed an ulcer, draining wound EXAM: RIGHT FOOT COMPLETE - 3+ VIEW COMPARISON:  None FINDINGS: Soft tissue swelling and medial forefoot into toes. Osseous mineralization normal. Joint spaces preserved. No definite acute fracture or dislocation. Slight cortical irregularity at the plantar aspect of the base of the proximal phalanx great toe on lateral view, unable to exclude subtle bone destruction/osteomyelitis changes. Questionable foci of soft tissue gas be bases LEFT proximal phalanges of the great and second toes. IMPRESSION: Questionable tiny foci of soft tissue gas between the bases of the proximal phalanges of the RIGHT great and second toes. Cortical irregularity at plantar aspect base of proximal phalanx great toe, unable to exclude osteomyelitis; consider further assessment by MR imaging.  Electronically Signed   By: Lavonia Dana M.D.   On: 01/27/2020 08:09    Procedures Procedures (including critical care time)  Medications Ordered in ED Medications - No data to display  ED Course  I have reviewed the triage vital signs and the nursing notes.  Pertinent labs & imaging results that were available during my care of the patient were reviewed by me and considered in my medical decision making (see chart for details).    MDM Rules/Calculators/A&P  MDM:  I spoke with [facility] Person,  She advised Doxycycline.  Dr. Sharol Given will se in office this week. Doxycycline rx given   Final Clinical Impression(s) / ED Diagnoses Final diagnoses:  Wound drainage    Rx / DC Orders ED Discharge Orders         Ordered    doxycycline (VIBRA-TABS) 100 MG tablet  2 times daily        01/27/20 1654        An After Visit Summary was printed and given to the patient.    Fransico Meadow, Hershal Coria 01/27/20 2049    Pattricia Boss, MD 01/30/20 1330

## 2020-01-27 NOTE — Discharge Instructions (Signed)
Return if any problems.

## 2020-01-27 NOTE — ED Notes (Signed)
AVS reviewed with patient who verbalized understanding.

## 2020-01-27 NOTE — ED Triage Notes (Signed)
Pt sent up from family medicine clinic, pt had injury from a nail to his R great toe two weeks ago and has ulcer present that now has purulent drainage. Pt has hx of L great toe amputation in the past, sent here for further eval osteomyelitis, possible IV abx.

## 2020-01-28 ENCOUNTER — Ambulatory Visit (INDEPENDENT_AMBULATORY_CARE_PROVIDER_SITE_OTHER): Payer: Self-pay | Admitting: Orthopedic Surgery

## 2020-01-28 ENCOUNTER — Encounter: Payer: Self-pay | Admitting: Orthopedic Surgery

## 2020-01-28 ENCOUNTER — Other Ambulatory Visit: Payer: Self-pay | Admitting: Physician Assistant

## 2020-01-28 DIAGNOSIS — L02611 Cutaneous abscess of right foot: Secondary | ICD-10-CM

## 2020-01-28 NOTE — Progress Notes (Signed)
Office Visit Note   Patient: Jon House           Date of Birth: 26-Mar-1971           MRN: [id number] Visit Date: 01/28/2020              Requested by: Cain Saupe, MD [address] [place],  [place] [address] PCP: Cain Saupe, MD  Chief Complaint  Patient presents with   Right Foot - Wound Check      HPI: Patient is a 49 year old gentleman who is seen for initial evaluation for abscess right foot.  Patient states that he stepped on a roofing nail about 3 weeks ago was seen at [facility] emergency room yesterday was started on oral antibiotics and was referred for evaluation today.  Patient complains of drainage swelling discoloration and pain in the first webspace.  Assessment & Plan: Visit Diagnoses:  1. Cutaneous abscess of right foot     Plan: We will plan for surgical debridement of the abscess with both a plantar and dorsal incision.  We will place a Penrose drain send tissue for cultures and adjust antibiotics pending culture sensitivity.  Plan for outpatient surgery risks and benefits are discussed including need for additional surgery.  Patient states he understands wished to proceed at this time we will place him in a postoperative shoe today.  Interpretation with Marisue Ivan.  Follow-Up Instructions: Return in about 1 week (around 02/04/2020).   Ortho Exam  Patient is alert, oriented, no adenopathy, well-dressed, normal affect, normal respiratory effort. Examination patient has a strong dorsalis pedis pulse with good hair growth.  He has brawny swelling over the dorsum of the first webspace with a draining ulcer over the plantar aspect of the first webspace.  Review of the radiographs does not show any air in the soft tissue does not show any bony abnormalities.  Patient has no tenderness to palpation of the calf no swelling past the midfoot.  Patient's hemoglobin A1c in March was 10.2.  Imaging: No results found. No images are attached to the  encounter.  Labs: Lab Results  Component Value Date   HGBA1C 10.2 (A) 07/07/2019   HGBA1C 9.6 (A) 10/02/2018   HGBA1C 11.1 (H) 07/29/2018   ESRSEDRATE 3 07/07/2019   ESRSEDRATE 69 (H) 10/06/2018   ESRSEDRATE 74 (H) 10/05/2018   CRP 13.7 (H) 10/06/2018   CRP 19.7 (H) 10/05/2018   CRP 19.7 (H) 10/04/2018   REPTSTATUS 10/09/2018 FINAL 10/04/2018   CULT  10/04/2018    NO GROWTH 5 DAYS Performed at [facility], [address], [place], [place] [address]      Lab Results  Component Value Date   ALBUMIN 3.9 10/03/2018   ALBUMIN 4.7 08/05/2018   ALBUMIN 3.9 07/29/2018   PREALBUMIN 9.6 (L) 10/04/2018    No results found for: MG No results found for: VD25OH  Lab Results  Component Value Date   PREALBUMIN 9.6 (L) 10/04/2018   CBC EXTENDED Latest Ref Rng & Units 01/27/2020 10/10/2018 10/08/2018  WBC 4.0 - 10.5 K/uL 5.8 9.5 5.2  RBC 4.22 - 5.81 MIL/uL 4.70 3.74(L) 4.22  HGB 13.0 - 17.0 g/dL 02.5 11.2(L) 12.5(L)  HCT 39 - 52 % 41.4 32.7(L) 36.7(L)  PLT 150 - 400 K/uL 343 305 272  NEUTROABS 1.7 - 7.7 K/uL - - -  LYMPHSABS 0.7 - 4.0 K/uL - - -     There is no height or weight on file to calculate BMI.  Orders:  No orders of the defined types were placed in this encounter.  No orders of the defined types were placed in this encounter.    Procedures: No procedures performed  Clinical Data: No additional findings.  ROS:  All other systems negative, except as noted in the HPI. Review of Systems  Objective: Vital Signs: There were no vitals taken for this visit.  Specialty Comments:  No specialty comments available.  PMFS History: Patient Active Problem List   Diagnosis Date Noted   History of amputation of lesser toe of left foot (HCC) 10/16/2018   Diabetic infection of left foot (HCC) 10/04/2018   Uncontrolled insulin dependent diabetes mellitus 10/04/2018   Diabetic polyneuropathy associated with type 2 diabetes mellitus (HCC)    Past Medical  History:  Diagnosis Date   Diabetes mellitus without complication (HCC)    Infection 10/04/2018   LEFT FOOT   Osteomyelitis of great toe of left foot (HCC) 07/29/2018   Subacute osteomyelitis, left ankle and foot (HCC)     Family History  Problem Relation Age of Onset   Diabetes Mother     Past Surgical History:  Procedure Laterality Date   AMPUTATION Left 07/31/2018   Procedure: LEFT GREAT TOE AMPUTATION;  Surgeon: [name], MD;  Location: [facility] OR;  Service: Orthopedics;  Laterality: Left;   AMPUTATION Left 10/09/2018   Procedure: LEFT FOOT 2ND TOE AND POSSIBLE 3RD TOE AMPUTATION;  Surgeon: [name], MD;  Location: [facility] OR;  Service: Orthopedics;  Laterality: Left;   Social History   Occupational History   Not on file  Tobacco Use   Smoking status: Former Smoker   Smokeless tobacco: Never Used  Building services engineer Use: Never used  Substance and Sexual Activity   Alcohol use: Yes    Comment: sometimes   Drug use: Never   Sexual activity: Not on file

## 2020-01-29 ENCOUNTER — Other Ambulatory Visit (HOSPITAL_COMMUNITY)
Admission: RE | Admit: 2020-01-29 | Discharge: 2020-01-29 | Disposition: A | Discharge status: Home / Self Care | Payer: HRSA Program | Source: Ambulatory Visit | Attending: Orthopedic Surgery | Admitting: Orthopedic Surgery

## 2020-01-29 ENCOUNTER — Other Ambulatory Visit (HOSPITAL_COMMUNITY): Payer: Self-pay

## 2020-01-29 ENCOUNTER — Other Ambulatory Visit: Payer: Self-pay

## 2020-01-29 ENCOUNTER — Encounter (HOSPITAL_COMMUNITY): Payer: Self-pay | Admitting: Orthopedic Surgery

## 2020-01-29 DIAGNOSIS — Z20822 Contact with and (suspected) exposure to covid-19: Secondary | ICD-10-CM | POA: Insufficient documentation

## 2020-01-29 DIAGNOSIS — Z01812 Encounter for preprocedural laboratory examination: Secondary | ICD-10-CM | POA: Insufficient documentation

## 2020-01-29 LAB — SARS CORONAVIRUS 2 (TAT 6-24 HRS): SARS Coronavirus 2: NEGATIVE

## 2020-01-29 NOTE — Progress Notes (Signed)
PCP - pt denies Cardiologist - pt denies  Chest x-ray - n/a EKG - n/a Stress Test - n/a ECHO - n/a Cardiac Cath - n/a    Fasting Blood Sugar - 120-220 Checks Blood Sugar 2 times a day  Blood Thinner Instructions: n/a Aspirin Instructions: n/a  ERAS Protcol - yes   COVID TEST- 01/29/20   Anesthesia review: n/a   -------------  SDW INSTRUCTIONS:  Your procedure is scheduled on Friday October 1.  Report to [facility] Main Entrance "A" at 11:30 A.M., and check in at the Admitting office.  Call this number if you have problems the morning of surgery: [phone]   Remember: Do not eat after midnight the night before your surgery  You may drink clear liquids until 11:00 A.M. the morning of your surgery.   Clear liquids allowed are: Water, Non-Citrus Juices (without pulp), Carbonated Beverages, Clear Tea, Black Coffee Only, and Gatorade   Take these medicines the morning of surgery with A SIP OF WATER: Doxycycline  If needed: oxycodone for pain   Diabetic medication:  Thursday 9/30  The day before surgery  --(NOVOLOG MIX 70/30)   AM dose - 10 units   PM dose - 7 units  --Insuline Glargine   AM dose - 20 units   PM dose - 10 units  --Metformin - take as usual   Friday 10/1 Day of surgery   --(NOVOLOG MIX 70/30)   AM dose - 7 untis  --Insuline Glargine   AM dose - 10 units  --Metformin - none   ** PLEASE check your blood sugar the morning of your surgery when you wake up and every 2 hours until you get to the Short Stay unit.  If your blood sugar is less than 70 mg/dL, you will need to treat for low blood sugar: - Do not take insulin. - Treat a low blood sugar (less than 70 mg/dL) with  cup of clear juice (cranberry or apple), 4 glucose tablets, OR glucose gel. - Recheck blood sugar in 15 minutes after treatment (to make sure it is greater than 70 mg/dL). If your blood sugar is not greater than 70 mg/dL on recheck, call [phone] for further  instructions.   As of today, STOP taking any Aspirin (unless otherwise instructed by your surgeon), Aleve, Naproxen, Ibuprofen, Motrin, Advil, Goody's, BC's, all herbal medications, fish oil, and all vitamins.    The Morning of Surgery  Do not wear jewelry  Do not wear lotions, powders, colognes, or deodorant  Do not shave 48 hours prior to surgery.    Do not bring valuables to the hospital.  [facility] is not responsible for any belongings or valuables.  If you are a smoker, DO NOT Smoke 24 hours prior to surgery  If you wear a CPAP at night please bring your mask the morning of surgery   Remember that you must have someone to transport you home after your surgery, and remain with you for 24 hours if you are discharged the same day.   Please bring cases for contacts, glasses, hearing aids, dentures or bridgework because it cannot be worn into surgery.    Leave your suitcase in the car.  After surgery it may be brought to your room.  For patients admitted to the hospital, discharge time will be determined by your treatment team.  Patients discharged the day of surgery will not be allowed to drive home.    Special instructions:   [place]- Preparing For Surgery  Oral Hygiene is also important to reduce your risk of infection.  Remember - BRUSH YOUR TEETH THE MORNING OF SURGERY WITH YOUR REGULAR TOOTHPASTE  Please follow these instructions carefully.   1. Shower the NIGHT BEFORE SURGERY and the MORNING OF SURGERY with DIAL Soap.   2. Wash thoroughly, paying special attention to the area where your surgery will be performed.  3. Thoroughly rinse your body with warm water from the neck down.  4. Pat yourself dry with a CLEAN TOWEL.  5. Wear CLEAN PAJAMAS to bed the night before surgery  6. Place CLEAN SHEETS on your bed the night of your first shower and DO NOT SLEEP WITH PETS.  7. Wear comfortable clothes the morning of surgery.    Day of Surgery:  Please shower  the morning of surgery with the CHG soap Do not apply any deodorants/lotions. Please wear clean clothes to the hospital/surgery center.   Remember to brush your teeth WITH YOUR REGULAR TOOTHPASTE.   Please read over the following fact sheets that you were given.  Patient denies shortness of breath, fever, cough and chest pain.

## 2020-01-30 ENCOUNTER — Encounter (HOSPITAL_COMMUNITY): Payer: Self-pay | Admitting: Orthopedic Surgery

## 2020-01-30 ENCOUNTER — Ambulatory Visit (HOSPITAL_COMMUNITY)
Admission: RE | Admit: 2020-01-30 | Discharge: 2020-01-30 | Disposition: A | Discharge status: Home / Self Care | Payer: Self-pay | Source: Ambulatory Visit | Attending: Orthopedic Surgery | Admitting: Orthopedic Surgery

## 2020-01-30 ENCOUNTER — Ambulatory Visit (HOSPITAL_COMMUNITY): Payer: Self-pay | Admitting: Anesthesiology

## 2020-01-30 ENCOUNTER — Encounter (HOSPITAL_COMMUNITY)
Admission: RE | Disposition: A | Discharge status: Home / Self Care | Payer: Self-pay | Source: Ambulatory Visit | Attending: Orthopedic Surgery

## 2020-01-30 DIAGNOSIS — Z833 Family history of diabetes mellitus: Secondary | ICD-10-CM | POA: Insufficient documentation

## 2020-01-30 DIAGNOSIS — Z89412 Acquired absence of left great toe: Secondary | ICD-10-CM | POA: Insufficient documentation

## 2020-01-30 DIAGNOSIS — L089 Local infection of the skin and subcutaneous tissue, unspecified: Secondary | ICD-10-CM

## 2020-01-30 DIAGNOSIS — L02611 Cutaneous abscess of right foot: Secondary | ICD-10-CM | POA: Insufficient documentation

## 2020-01-30 DIAGNOSIS — Z89422 Acquired absence of other left toe(s): Secondary | ICD-10-CM | POA: Insufficient documentation

## 2020-01-30 DIAGNOSIS — Z794 Long term (current) use of insulin: Secondary | ICD-10-CM | POA: Insufficient documentation

## 2020-01-30 DIAGNOSIS — B952 Enterococcus as the cause of diseases classified elsewhere: Secondary | ICD-10-CM | POA: Insufficient documentation

## 2020-01-30 DIAGNOSIS — E11628 Type 2 diabetes mellitus with other skin complications: Secondary | ICD-10-CM

## 2020-01-30 DIAGNOSIS — I4510 Unspecified right bundle-branch block: Secondary | ICD-10-CM | POA: Insufficient documentation

## 2020-01-30 DIAGNOSIS — Z1619 Resistance to other specified beta lactam antibiotics: Secondary | ICD-10-CM | POA: Insufficient documentation

## 2020-01-30 DIAGNOSIS — B9689 Other specified bacterial agents as the cause of diseases classified elsewhere: Secondary | ICD-10-CM | POA: Insufficient documentation

## 2020-01-30 DIAGNOSIS — Z91030 Bee allergy status: Secondary | ICD-10-CM | POA: Insufficient documentation

## 2020-01-30 DIAGNOSIS — Z87891 Personal history of nicotine dependence: Secondary | ICD-10-CM | POA: Insufficient documentation

## 2020-01-30 DIAGNOSIS — E119 Type 2 diabetes mellitus without complications: Secondary | ICD-10-CM | POA: Insufficient documentation

## 2020-01-30 HISTORY — PX: I & D EXTREMITY: SHX5045

## 2020-01-30 LAB — GLUCOSE, CAPILLARY
Glucose-Capillary: 183 mg/dL — ABNORMAL HIGH (ref 70–99)
Glucose-Capillary: 176 mg/dL — ABNORMAL HIGH (ref 70–99)

## 2020-01-30 LAB — SURGICAL PCR SCREEN
MRSA, PCR: NEGATIVE
Staphylococcus aureus: NEGATIVE

## 2020-01-30 SURGERY — IRRIGATION AND DEBRIDEMENT EXTREMITY
Anesthesia: General | Site: Foot | Laterality: Right

## 2020-01-30 MED ORDER — CEFAZOLIN SODIUM-DEXTROSE 2-4 GM/100ML-% IV SOLN
2.0000 g | INTRAVENOUS | Status: AC
  Administered 2020-01-30: 2 g via INTRAVENOUS

## 2020-01-30 MED ORDER — MIDAZOLAM HCL 5 MG/5ML IJ SOLN
INTRAMUSCULAR | Status: DC | PRN
  Administered 2020-01-30: 2 mg via INTRAVENOUS

## 2020-01-30 MED ORDER — PHENYLEPHRINE 40 MCG/ML (10ML) SYRINGE FOR IV PUSH (FOR BLOOD PRESSURE SUPPORT)
PREFILLED_SYRINGE | INTRAVENOUS | Status: AC
  Filled 2020-01-30: qty 10

## 2020-01-30 MED ORDER — CHLORHEXIDINE GLUCONATE 0.12 % MT SOLN
OROMUCOSAL | Status: AC
  Administered 2020-01-30: 15 mL via OROMUCOSAL
  Filled 2020-01-30: qty 15

## 2020-01-30 MED ORDER — FENTANYL CITRATE (PF) 250 MCG/5ML IJ SOLN
INTRAMUSCULAR | Status: AC
  Filled 2020-01-30: qty 5

## 2020-01-30 MED ORDER — MIDAZOLAM HCL 2 MG/2ML IJ SOLN
INTRAMUSCULAR | Status: AC
  Filled 2020-01-30: qty 2

## 2020-01-30 MED ORDER — LIDOCAINE 2% (20 MG/ML) 5 ML SYRINGE
INTRAMUSCULAR | Status: DC | PRN
  Administered 2020-01-30: 80 mg via INTRAVENOUS

## 2020-01-30 MED ORDER — LACTATED RINGERS IV SOLN: INTRAVENOUS | Status: DC

## 2020-01-30 MED ORDER — DEXAMETHASONE SODIUM PHOSPHATE 4 MG/ML IJ SOLN
INTRAMUSCULAR | Status: DC | PRN
  Administered 2020-01-30: 5 mg via INTRAVENOUS

## 2020-01-30 MED ORDER — ONDANSETRON HCL 4 MG/2ML IJ SOLN
INTRAMUSCULAR | Status: DC | PRN
  Administered 2020-01-30: 4 mg via INTRAVENOUS

## 2020-01-30 MED ORDER — PROPOFOL 10 MG/ML IV BOLUS
INTRAVENOUS | Status: AC
  Filled 2020-01-30: qty 20

## 2020-01-30 MED ORDER — LIDOCAINE 2% (20 MG/ML) 5 ML SYRINGE
INTRAMUSCULAR | Status: AC
  Filled 2020-01-30: qty 5

## 2020-01-30 MED ORDER — EPHEDRINE 5 MG/ML INJ
INTRAVENOUS | Status: AC
  Filled 2020-01-30: qty 10

## 2020-01-30 MED ORDER — PROPOFOL 10 MG/ML IV BOLUS
INTRAVENOUS | Status: DC | PRN
  Administered 2020-01-30: 200 mg via INTRAVENOUS

## 2020-01-30 MED ORDER — 0.9 % SODIUM CHLORIDE (POUR BTL) OPTIME
TOPICAL | Status: DC | PRN
  Administered 2020-01-30: 1000 mL

## 2020-01-30 MED ORDER — POVIDONE-IODINE 10 % EX SWAB: 2.0000 "application " | Freq: Once | CUTANEOUS | Status: DC

## 2020-01-30 MED ORDER — ORAL CARE MOUTH RINSE: 15.0000 mL | Freq: Once | OROMUCOSAL | Status: AC

## 2020-01-30 MED ORDER — CHLORHEXIDINE GLUCONATE 0.12 % MT SOLN: 15.0000 mL | Freq: Once | OROMUCOSAL | Status: AC

## 2020-01-30 MED ORDER — OXYCODONE-ACETAMINOPHEN 5-325 MG PO TABS: 1.0000 | ORAL_TABLET | ORAL | 0 refills | Status: DC | PRN

## 2020-01-30 MED ORDER — EPHEDRINE SULFATE-NACL 50-0.9 MG/10ML-% IV SOSY
PREFILLED_SYRINGE | INTRAVENOUS | Status: DC | PRN
  Administered 2020-01-30: 5 mg via INTRAVENOUS

## 2020-01-30 MED ORDER — PHENYLEPHRINE 40 MCG/ML (10ML) SYRINGE FOR IV PUSH (FOR BLOOD PRESSURE SUPPORT)
PREFILLED_SYRINGE | INTRAVENOUS | Status: DC | PRN
  Administered 2020-01-30 (×3): 80 ug via INTRAVENOUS
  Administered 2020-01-30: 120 ug via INTRAVENOUS

## 2020-01-30 MED ORDER — CEFAZOLIN SODIUM-DEXTROSE 2-4 GM/100ML-% IV SOLN
INTRAVENOUS | Status: AC
  Filled 2020-01-30: qty 100

## 2020-01-30 MED ORDER — CHLORHEXIDINE GLUCONATE 4 % EX LIQD: 60.0000 mL | Freq: Once | CUTANEOUS | Status: DC

## 2020-01-30 SURGICAL SUPPLY — 37 items
BLADE SURG 21 STRL SS (BLADE) ×3 IMPLANT
BNDG COHESIVE 4X5 TAN STRL (GAUZE/BANDAGES/DRESSINGS) ×3 IMPLANT
BNDG COHESIVE 6X5 TAN STRL LF (GAUZE/BANDAGES/DRESSINGS) ×3 IMPLANT
BNDG GAUZE ELAST 4 BULKY (GAUZE/BANDAGES/DRESSINGS) ×3 IMPLANT
COVER SURGICAL LIGHT HANDLE (MISCELLANEOUS) ×3 IMPLANT
COVER WAND RF STERILE (DRAPES) IMPLANT
DRAIN PENROSE 0.75X12 (DRAIN) ×3 IMPLANT
DRAPE U-SHAPE 47X51 STRL (DRAPES) ×3 IMPLANT
DRSG ADAPTIC 3X8 NADH LF (GAUZE/BANDAGES/DRESSINGS) ×3 IMPLANT
DURAPREP 26ML APPLICATOR (WOUND CARE) ×3 IMPLANT
ELECT REM PT RETURN 9FT ADLT (ELECTROSURGICAL)
ELECTRODE REM PT RTRN 9FT ADLT (ELECTROSURGICAL) IMPLANT
GAUZE SPONGE 4X4 12PLY STRL (GAUZE/BANDAGES/DRESSINGS) ×3 IMPLANT
GAUZE SPONGE 4X4 12PLY STRL LF (GAUZE/BANDAGES/DRESSINGS) ×3 IMPLANT
GAUZE XEROFORM 5X9 LF (GAUZE/BANDAGES/DRESSINGS) IMPLANT
GLOVE BIOGEL PI IND STRL 9 (GLOVE) ×1 IMPLANT
GLOVE BIOGEL PI INDICATOR 9 (GLOVE) ×2
GLOVE SURG ORTHO 9.0 STRL STRW (GLOVE) ×3 IMPLANT
GOWN STRL REUS W/ TWL XL LVL3 (GOWN DISPOSABLE) ×2 IMPLANT
GOWN STRL REUS W/TWL XL LVL3 (GOWN DISPOSABLE) ×6
HANDPIECE INTERPULSE COAX TIP (DISPOSABLE)
KIT BASIN OR (CUSTOM PROCEDURE TRAY) ×3 IMPLANT
KIT TURNOVER KIT B (KITS) ×3 IMPLANT
MANIFOLD NEPTUNE II (INSTRUMENTS) IMPLANT
NS IRRIG 1000ML POUR BTL (IV SOLUTION) ×3 IMPLANT
PACK ORTHO EXTREMITY (CUSTOM PROCEDURE TRAY) ×3 IMPLANT
PAD ABD 8X10 STRL (GAUZE/BANDAGES/DRESSINGS) ×3 IMPLANT
PAD ARMBOARD 7.5X6 YLW CONV (MISCELLANEOUS) IMPLANT
SET HNDPC FAN SPRY TIP SCT (DISPOSABLE) IMPLANT
STOCKINETTE IMPERVIOUS 9X36 MD (GAUZE/BANDAGES/DRESSINGS) IMPLANT
SUT ETHILON 2 0 PSLX (SUTURE) ×3 IMPLANT
SWAB COLLECTION DEVICE MRSA (MISCELLANEOUS) IMPLANT
SWAB CULTURE ESWAB REG 1ML (MISCELLANEOUS) IMPLANT
TOWEL GREEN STERILE (TOWEL DISPOSABLE) ×3 IMPLANT
TUBE CONNECTING 12'X1/4 (SUCTIONS) ×1
TUBE CONNECTING 12X1/4 (SUCTIONS) ×2 IMPLANT
YANKAUER SUCT BULB TIP NO VENT (SUCTIONS) ×3 IMPLANT

## 2020-01-30 NOTE — Anesthesia Procedure Notes (Signed)
Procedure Name: Intubation Date/Time: 01/30/2020 2:02 PM Performed by: Lelon Perla, CRNA Pre-anesthesia Checklist: Patient identified, Emergency Drugs available, Suction available and Patient being monitored Patient Re-evaluated:Patient Re-evaluated prior to induction Oxygen Delivery Method: Circle system utilized Preoxygenation: Pre-oxygenation with 100% oxygen Induction Type: IV induction Ventilation: Mask ventilation without difficulty LMA: LMA inserted LMA Size: 4.0 Tube type: Oral Number of attempts: 1 Placement Confirmation: ETT inserted through vocal cords under direct vision,  positive ETCO2 and breath sounds checked- equal and bilateral Tube secured with: Tape Dental Injury: Teeth and Oropharynx as per pre-operative assessment

## 2020-01-30 NOTE — Op Note (Signed)
01/30/2020  2:37 PM  PATIENT:  Jon House    PRE-OPERATIVE DIAGNOSIS:  Abscess right Foot  POST-OPERATIVE DIAGNOSIS:  Same  PROCEDURE:  DEBRIDEMENT ABSCESS RIGHT FOOT  SURGEON:  [name], MD  PHYSICIAN ASSISTANT:None ANESTHESIA:   General  PREOPERATIVE INDICATIONS:  [name] is a  49 y.o. male with a diagnosis of Abscess right Foot who failed conservative measures and elected for surgical management.    The risks benefits and alternatives were discussed with the patient preoperatively including but not limited to the risks of infection, bleeding, nerve injury, cardiopulmonary complications, the need for revision surgery, among others, and the patient was willing to proceed.  OPERATIVE IMPLANTS: Penrose drain quarter-inch  @ENCIMAGES @  OPERATIVE FINDINGS: Infection extended down to the lateral sesamoid partial resection of the sesamoid was sent with skin and soft tissue and fascia for cultures.  OPERATIVE PROCEDURE: Patient brought the operating room and underwent a general anesthetic.  After adequate levels anesthesia were obtained patient's right lower extremity was prepped using DuraPrep draped into a sterile field a timeout was called.  Elliptical plantar incision was made around the ulcerative tissue plantarly where the swelling and discoloration and fluctuance was dorsally an incision was made dorsally as well.  These 2 communicated a Cobb elevator was placed through the open area.  The plantar ulcer extended down to the lateral sesamoid that had destructive bony changes a rondure was used to partially remove the lateral sesamoid the soft tissue and bone was sent for cultures.  The wound was irrigated with normal saline a Penrose drain was placed through and through the wound the dorsal and plantar incision were closed using 2-0 nylon sterile dressing was applied patient was extubated taken the PACU in stable condition.   DISCHARGE PLANNING:  Antibiotic  duration: Continue doxycycline adjust antibiotics pending cultures  Weightbearing: Touchdown weightbearing on the right  Pain medication: Prescription for Percocet  Dressing care/ Wound VAC: Follow-up in the office 1 week to change the dressing  Ambulatory devices: Crutches  Discharge to: Home.  Follow-up: In the office 1 week post operative.

## 2020-01-30 NOTE — Transfer of Care (Signed)
Immediate Anesthesia Transfer of Care Note  Patient: Jon House  Procedure(s) Performed: DEBRIDEMENT ABSCESS RIGHT FOOT (Right Foot)  Patient Location: PACU  Anesthesia Type:General  Level of Consciousness: drowsy  Airway & Oxygen Therapy: Patient Spontanous Breathing  Post-op Assessment: Report given to RN and Post -op Vital signs reviewed and stable  Post vital signs: Reviewed and stable  Last Vitals:  Vitals Value Taken Time  BP 111/64 01/30/20 1436  Temp    Pulse 78 01/30/20 1438  Resp 20 01/30/20 1438  SpO2 100 % 01/30/20 1438  Vitals shown include unvalidated device data.  Last Pain:  Vitals:   01/30/20 1244  TempSrc:   PainSc: 0-No pain      Patients Stated Pain Goal: 3 (01/30/20 1244)  Complications: No complications documented.

## 2020-01-30 NOTE — H&P (Signed)
Jon House is an 49 y.o. male.   Chief Complaint: Abscess Right foot HPI: Patient is a 49 year old gentleman who is seen for initial evaluation for abscess right foot.  Patient states that he stepped on a roofing nail about 3 weeks ago was seen at [facility] emergency room yesterday was started on oral antibiotics and was referred for evaluation today.  Patient complains of drainage swelling discoloration and pain in the first webspace.  Past Medical History:  Diagnosis Date   Diabetes mellitus without complication (HCC)    Infection 10/04/2018   LEFT FOOT   Osteomyelitis of great toe of left foot (HCC) 07/29/2018   Subacute osteomyelitis, left ankle and foot [facility])     Past Surgical History:  Procedure Laterality Date   AMPUTATION Left 07/31/2018   Procedure: LEFT GREAT TOE AMPUTATION;  Surgeon: [name], MD;  Location: [facility] OR;  Service: Orthopedics;  Laterality: Left;   AMPUTATION Left 10/09/2018   Procedure: LEFT FOOT 2ND TOE AND POSSIBLE 3RD TOE AMPUTATION;  Surgeon: [name], MD;  Location: [facility] OR;  Service: Orthopedics;  Laterality: Left;    Family History  Problem Relation Age of Onset   Diabetes Mother    Social History:  reports that he has quit smoking. He has never used smokeless tobacco. He reports current alcohol use. He reports that he does not use drugs.  Allergies:  Allergies  Allergen Reactions   Bee Venom Hives    No medications prior to admission.    Results for orders placed or performed during the hospital encounter of 01/29/20 (from the past 48 hour(s))  SARS CORONAVIRUS 2 (TAT 6-24 HRS) Nasopharyngeal Nasopharyngeal Swab     Status: None   Collection Time: 01/29/20 12:04 PM   Specimen: Nasopharyngeal Swab  Result Value Ref Range   SARS Coronavirus 2 NEGATIVE NEGATIVE    Comment: (NOTE) SARS-CoV-2 target nucleic acids are NOT DETECTED.  The SARS-CoV-2 RNA is generally detectable in upper and lower respiratory specimens during  the acute phase of infection. Negative results do not preclude SARS-CoV-2 infection, do not rule out co-infections with other pathogens, and should not be used as the sole basis for treatment or other patient management decisions. Negative results must be combined with clinical observations, patient history, and epidemiological information. The expected result is Negative.  Fact Sheet for Patients: HairSlick.no  Fact Sheet for Healthcare Providers: quierodirigir.com  This test is not yet approved or cleared by the [place] FDA and  has been authorized for detection and/or diagnosis of SARS-CoV-2 by FDA under an Emergency Use Authorization (EUA). This EUA will remain  in effect (meaning this test can be used) for the duration of the COVID-19 declaration under Se ction 564(b)(1) of the Act, 21 U.S.C. section 360bbb-3(b)(1), unless the authorization is terminated or revoked sooner.  Performed at [facility], [address], [place], [place] [address]    No results found.  Review of Systems  All other systems reviewed and are negative.   Height 5' 9.69" (1.77 m), weight 72 kg. Physical Exam  Patient is alert, oriented, no adenopathy, well-dressed, normal affect, normal respiratory effort. Examination patient has a strong dorsalis pedis pulse with good hair growth.  He has brawny swelling over the dorsum of the first webspace with a draining ulcer over the plantar aspect of the first webspace.  Review of the radiographs does not show any air in the soft tissue does not show any bony abnormalities.  Patient has  no tenderness to palpation of the calf no swelling past the midfoot.  Patient's hemoglobin A1c in March was 10.2 Heart RRR Lungs Clear. Assessment/Plan 1. Cutaneous abscess of right foot     Plan: We will plan for surgical debridement of the abscess with both a plantar and dorsal incision.  We will place a  Penrose drain send tissue for cultures and adjust antibiotics pending culture sensitivity.  Plan for outpatient surgery risks and benefits are discussed including need for additional surgery.  Patient states he understands wished to proceed at this time we will place him in a postoperative shoe today.  Interpretation with Marisue Ivan.   [place] Christiana Gurevich, PA 01/30/2020, 6:55 AM

## 2020-01-30 NOTE — Anesthesia Preprocedure Evaluation (Addendum)
Anesthesia Evaluation  Patient identified by MRN, date of birth, ID band Patient awake    Reviewed: Allergy & Precautions, NPO status , Patient's Chart, lab work & pertinent test results  History of Anesthesia Complications Negative for: history of anesthetic complications  Airway Mallampati: II  TM Distance: >3 FB Neck ROM: Full    Dental  (+) Dental Advisory Given, Chipped,    Pulmonary former smoker,    Pulmonary exam normal        Cardiovascular negative cardio ROS Normal cardiovascular exam     Neuro/Psych  Neuromuscular disease (polyneuropathy) negative psych ROS   GI/Hepatic negative GI ROS, Neg liver ROS,   Endo/Other  diabetes, Poorly Controlled, Type 2, Insulin Dependent  Renal/GU negative Renal ROS     Musculoskeletal negative musculoskeletal ROS (+)   Abdominal   Peds  Hematology negative hematology ROS (+)   Anesthesia Other Findings Covid test negative   Reproductive/Obstetrics                            Anesthesia Physical Anesthesia Plan  ASA: III  Anesthesia Plan: General   Post-op Pain Management:    Induction: Intravenous  PONV Risk Score and Plan: 2 and Treatment may vary due to age or medical condition, Ondansetron and Midazolam  Airway Management Planned: LMA  Additional Equipment: None  Intra-op Plan:   Post-operative Plan: Extubation in OR  Informed Consent: I have reviewed the patients History and Physical, chart, labs and discussed the procedure including the risks, benefits and alternatives for the proposed anesthesia with the patient or authorized representative who has indicated his/her understanding and acceptance.     Dental advisory given and Interpreter used for interveiw  Plan Discussed with: CRNA and Anesthesiologist  Anesthesia Plan Comments:        Anesthesia Quick Evaluation

## 2020-01-30 NOTE — Anesthesia Postprocedure Evaluation (Signed)
Anesthesia Post Note  Patient: Jon House  Procedure(s) Performed: DEBRIDEMENT ABSCESS RIGHT FOOT (Right Foot)     Patient location during evaluation: PACU Anesthesia Type: General Level of consciousness: awake and alert Pain management: pain level controlled Vital Signs Assessment: post-procedure vital signs reviewed and stable Respiratory status: spontaneous breathing, nonlabored ventilation and respiratory function stable Cardiovascular status: blood pressure returned to baseline and stable Postop Assessment: no apparent nausea or vomiting Anesthetic complications: no   No complications documented.  Last Vitals:  Vitals:   01/30/20 1506 01/30/20 1515  BP: 125/79   Pulse: 84 87  Resp: 20 19  Temp: 36.5 C   SpO2: 100% 100%    Last Pain:  Vitals:   01/30/20 1506  TempSrc:   PainSc: 0-No pain                 Beryle Lathe

## 2020-01-30 NOTE — Progress Notes (Signed)
Orthopedic Tech Progress Note Patient Details:  Jon House 1971/02/18 [id number]  Ortho Devices Type of Ortho Device: Crutches, Postop shoe/boot Ortho Device/Splint Location: RLE Ortho Device/Splint Interventions: Application, Adjustment   Post Interventions Patient Tolerated: Well Instructions Provided: Care of device   Donald Pore 01/30/2020, 3:47 PM

## 2020-01-31 ENCOUNTER — Encounter (HOSPITAL_COMMUNITY): Payer: Self-pay | Admitting: Orthopedic Surgery

## 2020-02-02 ENCOUNTER — Other Ambulatory Visit: Payer: Self-pay | Admitting: Orthopedic Surgery

## 2020-02-02 ENCOUNTER — Encounter: Payer: Self-pay | Admitting: Physician Assistant

## 2020-02-02 ENCOUNTER — Ambulatory Visit (INDEPENDENT_AMBULATORY_CARE_PROVIDER_SITE_OTHER): Payer: Self-pay | Admitting: Physician Assistant

## 2020-02-02 VITALS — Ht 69.0 in | Wt 158.0 lb

## 2020-02-02 DIAGNOSIS — E11628 Type 2 diabetes mellitus with other skin complications: Secondary | ICD-10-CM

## 2020-02-02 DIAGNOSIS — L089 Local infection of the skin and subcutaneous tissue, unspecified: Secondary | ICD-10-CM

## 2020-02-02 MED ORDER — CIPROFLOXACIN HCL 500 MG PO TABS: 500.0000 mg | ORAL_TABLET | Freq: Two times a day (BID) | ORAL | 0 refills | Status: DC

## 2020-02-02 NOTE — Progress Notes (Signed)
Office Visit Note   Patient: Jon House           Date of Birth: 09/19/1970           MRN: [id number] Visit Date: 02/02/2020              Requested by: Cain Saupe, MD [address] [place],  [place] [address] PCP: Cain Saupe, MD  Chief Complaint  Patient presents with  . Right Foot - Routine Post Op    Right foot abscess debridement 01/30/2020      HPI The patient is a 49 year old gentleman who is 3 days status post irrigation and debridement of a right foot abscess.  He comes in today for removal of the drain.  He has been in a postop shoe using crutches  Assessment & Plan: Visit Diagnoses: No diagnosis found.  Plan: Through interpreter I told him that he should change his antibiotic.  He will discontinue the doxycycline and begin Cipro.  We will call this into his pharmacy.  He may wash his foot daily with mild antibacterial soap and water and apply dry dressing.  Follow-up in 1 week.  Follow-Up Instructions: No follow-ups on file.   Ortho Exam  Patient is alert, oriented, no adenopathy, well-dressed, normal affect, normal respiratory effort. Wound is well controlled no foul odor surgical stitches in place Penrose drain in place and forefoot.  He does have moderate amount of tissue maceration on the bottom of the wound.  No cellulitis no purulent drainage.  After cutting the bottom of the Penrose drain it was removed to the top incision without any difficulty  Imaging: No results found. No images are attached to the encounter.  Labs: Lab Results  Component Value Date   HGBA1C 10.2 (A) 07/07/2019   HGBA1C 9.6 (A) 10/02/2018   HGBA1C 11.1 (H) 07/29/2018   ESRSEDRATE 3 07/07/2019   ESRSEDRATE 69 (H) 10/06/2018   ESRSEDRATE 74 (H) 10/05/2018   CRP 13.7 (H) 10/06/2018   CRP 19.7 (H) 10/05/2018   CRP 19.7 (H) 10/04/2018   REPTSTATUS PENDING 01/30/2020   GRAMSTAIN  01/30/2020    RARE WBC PRESENT, PREDOMINANTLY PMN RARE GRAM POSITIVE COCCI Performed  at [facility], [address], [place], [place] [address]    CULT  01/30/2020    FEW ENTEROBACTER CLOACAE FEW ENTEROCOCCUS FAECALIS    LABORGA ENTEROBACTER CLOACAE 01/30/2020     Lab Results  Component Value Date   ALBUMIN 3.9 10/03/2018   ALBUMIN 4.7 08/05/2018   ALBUMIN 3.9 07/29/2018   PREALBUMIN 9.6 (L) 10/04/2018    No results found for: MG No results found for: VD25OH  Lab Results  Component Value Date   PREALBUMIN 9.6 (L) 10/04/2018   CBC EXTENDED Latest Ref Rng & Units 01/27/2020 10/10/2018 10/08/2018  WBC 4.0 - 10.5 K/uL 5.8 9.5 5.2  RBC 4.22 - 5.81 MIL/uL 4.70 3.74(L) 4.22  HGB 13.0 - 17.0 g/dL 86.7 11.2(L) 12.5(L)  HCT 39 - 52 % 41.4 32.7(L) 36.7(L)  PLT 150 - 400 K/uL 343 305 272  NEUTROABS 1.7 - 7.7 K/uL - - -  LYMPHSABS 0.7 - 4.0 K/uL - - -     Body mass index is 23.33 kg/m.  Orders:  No orders of the defined types were placed in this encounter.  No orders of the defined types were placed in this encounter.    Procedures: No procedures performed  Clinical Data: No additional findings.  ROS:  All other systems negative, except as  noted in the HPI. Review of Systems  Objective: Vital Signs: Ht 5\' 9"  (1.753 m)   Wt 158 lb (71.7 kg)   BMI 23.33 kg/m   Specialty Comments:  No specialty comments available.  PMFS History: Patient Active Problem List   Diagnosis Date Noted  . History of amputation of lesser toe of left foot (HCC) 10/16/2018  . Diabetic infection of left foot (HCC) 10/04/2018  . Diabetic infection of right foot (HCC) 10/04/2018  . Diabetic polyneuropathy associated with type 2 diabetes mellitus (HCC)    Past Medical History:  Diagnosis Date  . Diabetes mellitus without complication (HCC)    Type II  . Infection 10/04/2018   LEFT FOOT  . Osteomyelitis of great toe of left foot (HCC) 07/29/2018  . Subacute osteomyelitis, left ankle and foot (HCC)     Family History  Problem Relation Age of Onset  . Diabetes  Mother     Past Surgical History:  Procedure Laterality Date  . AMPUTATION Left 07/31/2018   Procedure: LEFT GREAT TOE AMPUTATION;  Surgeon: 09/30/2018, MD;  Location: [facility] OR;  Service: Orthopedics;  Laterality: Left;  . AMPUTATION Left 10/09/2018   Procedure: LEFT FOOT 2ND TOE AND POSSIBLE 3RD TOE AMPUTATION;  Surgeon: 12/09/2018, MD;  Location: [facility] OR;  Service: Orthopedics;  Laterality: Left;  . I & D EXTREMITY Right 01/30/2020   Procedure: DEBRIDEMENT ABSCESS RIGHT FOOT;  Surgeon: 03/31/2020, MD;  Location: [facility] OR;  Service: Orthopedics;  Laterality: Right;   Social History   Occupational History  . Not on file  Tobacco Use  . Smoking status: Former [facility]  . Smokeless tobacco: Never Used  Vaping Use  . Vaping Use: Never used  Substance and Sexual Activity  . Alcohol use: Yes    Comment: sometimes  . Drug use: Never  . Sexual activity: Not on file

## 2020-02-04 LAB — AEROBIC/ANAEROBIC CULTURE W GRAM STAIN (SURGICAL/DEEP WOUND)

## 2020-02-09 ENCOUNTER — Ambulatory Visit (INDEPENDENT_AMBULATORY_CARE_PROVIDER_SITE_OTHER): Payer: Self-pay | Admitting: Physician Assistant

## 2020-02-09 ENCOUNTER — Encounter: Payer: Self-pay | Admitting: Orthopedic Surgery

## 2020-02-09 VITALS — Ht 69.0 in | Wt 158.0 lb

## 2020-02-09 DIAGNOSIS — E11628 Type 2 diabetes mellitus with other skin complications: Secondary | ICD-10-CM

## 2020-02-09 DIAGNOSIS — L089 Local infection of the skin and subcutaneous tissue, unspecified: Secondary | ICD-10-CM

## 2020-02-09 NOTE — Progress Notes (Signed)
Office Visit Note   Patient: Jon House           Date of Birth: [birth year]           MRN: [id number] Visit Date: 02/09/2020              Requested by: Cain Saupe, MD [address] [place],  [place] [address] PCP: Cain Saupe, MD  Chief Complaint  Patient presents with  . Right Foot - Follow-up    Right foot abscess debridement 01/30/2020      HPI: Patient is 10 days status post debridement of abscess around his great toe.  He is currently on ciprofloxacin.  He is here for routine follow-up he does say that his foot is hurting less.  He has been weightbearing on his foot because he locked himself out of his house where his crutches were.  Other than that he states he normally does use his crutches  Assessment & Plan: Visit Diagnoses: No diagnosis found.  Plan: Emphasized the importance of elevating and limiting weightbearing.  He may wash this with antibacterial soap and water apply clean dressing every day.  Follow-up in 1 week.  Follow-Up Instructions: No follow-ups on file.   Ortho Exam  Patient is alert, oriented, no adenopathy, well-dressed, normal affect, normal respiratory effort. Focused examination demonstrates healing surgical incisions.  No erythema or cellulitis.  No foul odor.  No purulent drainage.  He does have a moderate amount of soft tissue swelling  Imaging: No results found. No images are attached to the encounter.  Labs: Lab Results  Component Value Date   HGBA1C 10.2 (A) 07/07/2019   HGBA1C 9.6 (A) 10/02/2018   HGBA1C 11.1 (H) 07/29/2018   ESRSEDRATE 3 07/07/2019   ESRSEDRATE 69 (H) 10/06/2018   ESRSEDRATE 74 (H) 10/05/2018   CRP 13.7 (H) 10/06/2018   CRP 19.7 (H) 10/05/2018   CRP 19.7 (H) 10/04/2018   REPTSTATUS 02/04/2020 FINAL 01/30/2020   GRAMSTAIN  01/30/2020    RARE WBC PRESENT, PREDOMINANTLY PMN RARE GRAM POSITIVE COCCI    CULT  01/30/2020    FEW ENTEROBACTER CLOACAE FEW ENTEROCOCCUS FAECALIS NO ANAEROBES  ISOLATED Performed at [facility], [address], [place], [place] [address]    LABORGA ENTEROBACTER CLOACAE 01/30/2020   LABORGA ENTEROCOCCUS FAECALIS 01/30/2020     Lab Results  Component Value Date   ALBUMIN 3.9 10/03/2018   ALBUMIN 4.7 08/05/2018   ALBUMIN 3.9 07/29/2018   PREALBUMIN 9.6 (L) 10/04/2018    No results found for: MG No results found for: VD25OH  Lab Results  Component Value Date   PREALBUMIN 9.6 (L) 10/04/2018   CBC EXTENDED Latest Ref Rng & Units 01/27/2020 10/10/2018 10/08/2018  WBC 4.0 - 10.5 K/uL 5.8 9.5 5.2  RBC 4.22 - 5.81 MIL/uL 4.70 3.74(L) 4.22  HGB 13.0 - 17.0 g/dL 77.8 11.2(L) 12.5(L)  HCT 39 - 52 % 41.4 32.7(L) 36.7(L)  PLT 150 - 400 K/uL 343 305 272  NEUTROABS 1.7 - 7.7 K/uL - - -  LYMPHSABS 0.7 - 4.0 K/uL - - -     Body mass index is 23.33 kg/m.  Orders:  No orders of the defined types were placed in this encounter.  No orders of the defined types were placed in this encounter.    Procedures: No procedures performed  Clinical Data: No additional findings.  ROS:  All other systems negative, except as noted in the HPI. Review of Systems  Objective: Vital Signs: Ht 5\' 9"  (  1.753 m)   Wt 158 lb (71.7 kg)   BMI 23.33 kg/m   Specialty Comments:  No specialty comments available.  PMFS History: Patient Active Problem List   Diagnosis Date Noted  . History of amputation of lesser toe of left foot (HCC) 10/16/2018  . Diabetic infection of left foot (HCC) 10/04/2018  . Diabetic infection of right foot (HCC) 10/04/2018  . Diabetic polyneuropathy associated with type 2 diabetes mellitus (HCC)    Past Medical History:  Diagnosis Date  . Diabetes mellitus without complication (HCC)    Type II  . Infection 10/04/2018   LEFT FOOT  . Osteomyelitis of great toe of left foot (HCC) 07/29/2018  . Subacute osteomyelitis, left ankle and foot (HCC)     Family History  Problem Relation Age of Onset  . Diabetes Mother     Past  Surgical History:  Procedure Laterality Date  . AMPUTATION Left 07/31/2018   Procedure: LEFT GREAT TOE AMPUTATION;  Surgeon: [name], MD;  Location: [facility] OR;  Service: Orthopedics;  Laterality: Left;  . AMPUTATION Left 10/09/2018   Procedure: LEFT FOOT 2ND TOE AND POSSIBLE 3RD TOE AMPUTATION;  Surgeon: [name], MD;  Location: [facility] OR;  Service: Orthopedics;  Laterality: Left;  . I & D EXTREMITY Right 01/30/2020   Procedure: DEBRIDEMENT ABSCESS RIGHT FOOT;  Surgeon: [name], MD;  Location: [facility] OR;  Service: Orthopedics;  Laterality: Right;   Social History   Occupational History  . Not on file  Tobacco Use  . Smoking status: Former [employer]  . Smokeless tobacco: Never Used  Vaping Use  . Vaping Use: Never used  Substance and Sexual Activity  . Alcohol use: Yes    Comment: sometimes  . Drug use: Never  . Sexual activity: Not on file

## 2020-02-16 ENCOUNTER — Ambulatory Visit (INDEPENDENT_AMBULATORY_CARE_PROVIDER_SITE_OTHER): Payer: Self-pay | Admitting: Physician Assistant

## 2020-02-16 ENCOUNTER — Encounter: Payer: Self-pay | Admitting: Orthopedic Surgery

## 2020-02-16 VITALS — Ht 69.0 in | Wt 158.0 lb

## 2020-02-16 DIAGNOSIS — L089 Local infection of the skin and subcutaneous tissue, unspecified: Secondary | ICD-10-CM

## 2020-02-16 DIAGNOSIS — E11628 Type 2 diabetes mellitus with other skin complications: Secondary | ICD-10-CM

## 2020-02-16 NOTE — Progress Notes (Signed)
Office Visit Note   Patient: Jon House           Date of Birth: 1971-01-13           MRN: [id number] Visit Date: 02/16/2020              Requested by: Cain Saupe, MD [address] [place],  [place] [address] PCP: Cain Saupe, MD  Chief Complaint  Patient presents with  . Right Foot - Follow-up    01/30/2020 Debridement abscess right foot      HPI: This is a pleasant 49 year old Spanish-speaking gentleman who was seen with an interpreter today.  He is 3 weeks status post irrigation and debridement of right foot abscess.  Overall the wound is healing well but he notices in the webspace he still gets serous drainage.  Not requesting any pain medication.  He also states he was doing exercises with both of his knees last week.  Following this he had some Soreness in both of his calves both operative and nonoperative this seems to be getting better  Assessment & Plan: Visit Diagnoses: No diagnosis found.  Plan: Placed him back on his antibiotics for a week we will follow up at that time Follow-Up Instructions: No follow-ups on file.   Ortho Exam  Patient is alert, oriented, no adenopathy, well-dressed, normal affect, normal respiratory effort. Wound is overall healed except in the web space and on the dorsum where there is a little bit of wound dehiscence with some bloody drainage.  No foul odor no cellulitis no swelling that is significant.  Compartments of both calves are soft and nontender negative Homans' sign no ascending cellulitis  Imaging: No results found. No images are attached to the encounter.  Labs: Lab Results  Component Value Date   HGBA1C 10.2 (A) 07/07/2019   HGBA1C 9.6 (A) 10/02/2018   HGBA1C 11.1 (H) 07/29/2018   ESRSEDRATE 3 07/07/2019   ESRSEDRATE 69 (H) 10/06/2018   ESRSEDRATE 74 (H) 10/05/2018   CRP 13.7 (H) 10/06/2018   CRP 19.7 (H) 10/05/2018   CRP 19.7 (H) 10/04/2018   REPTSTATUS 02/04/2020 FINAL 01/30/2020   GRAMSTAIN   01/30/2020    RARE WBC PRESENT, PREDOMINANTLY PMN RARE GRAM POSITIVE COCCI    CULT  01/30/2020    FEW ENTEROBACTER CLOACAE FEW ENTEROCOCCUS FAECALIS NO ANAEROBES ISOLATED Performed at [facility], [address], [place], [place] [address]    LABORGA ENTEROBACTER CLOACAE 01/30/2020   LABORGA ENTEROCOCCUS FAECALIS 01/30/2020     Lab Results  Component Value Date   ALBUMIN 3.9 10/03/2018   ALBUMIN 4.7 08/05/2018   ALBUMIN 3.9 07/29/2018   PREALBUMIN 9.6 (L) 10/04/2018    No results found for: MG No results found for: VD25OH  Lab Results  Component Value Date   PREALBUMIN 9.6 (L) 10/04/2018   CBC EXTENDED Latest Ref Rng & Units 01/27/2020 10/10/2018 10/08/2018  WBC 4.0 - 10.5 K/uL 5.8 9.5 5.2  RBC 4.22 - 5.81 MIL/uL 4.70 3.74(L) 4.22  HGB 13.0 - 17.0 g/dL 26.2 11.2(L) 12.5(L)  HCT 39 - 52 % 41.4 32.7(L) 36.7(L)  PLT 150 - 400 K/uL 343 305 272  NEUTROABS 1.7 - 7.7 K/uL - - -  LYMPHSABS 0.7 - 4.0 K/uL - - -     Body mass index is 23.33 kg/m.  Orders:  No orders of the defined types were placed in this encounter.  No orders of the defined types were placed in this encounter.    Procedures: No  procedures performed  Clinical Data: No additional findings.  ROS:  All other systems negative, except as noted in the HPI. Review of Systems  Objective: Vital Signs: Ht 5\' 9"  (1.753 m)   Wt 158 lb (71.7 kg)   BMI 23.33 kg/m   Specialty Comments:  No specialty comments available.  PMFS History: Patient Active Problem List   Diagnosis Date Noted  . History of amputation of lesser toe of left foot (HCC) 10/16/2018  . Diabetic infection of left foot (HCC) 10/04/2018  . Diabetic infection of right foot (HCC) 10/04/2018  . Diabetic polyneuropathy associated with type 2 diabetes mellitus (HCC)    Past Medical History:  Diagnosis Date  . Diabetes mellitus without complication (HCC)    Type II  . Infection 10/04/2018   LEFT FOOT  . Osteomyelitis of great  toe of left foot (HCC) 07/29/2018  . Subacute osteomyelitis, left ankle and foot (HCC)     Family History  Problem Relation Age of Onset  . Diabetes Mother     Past Surgical History:  Procedure Laterality Date  . AMPUTATION Left 07/31/2018   Procedure: LEFT GREAT TOE AMPUTATION;  Surgeon: 09/30/2018, MD;  Location: [facility] OR;  Service: Orthopedics;  Laterality: Left;  . AMPUTATION Left 10/09/2018   Procedure: LEFT FOOT 2ND TOE AND POSSIBLE 3RD TOE AMPUTATION;  Surgeon: 12/09/2018, MD;  Location: [facility] OR;  Service: Orthopedics;  Laterality: Left;  . I & D EXTREMITY Right 01/30/2020   Procedure: DEBRIDEMENT ABSCESS RIGHT FOOT;  Surgeon: 03/31/2020, MD;  Location: [facility] OR;  Service: Orthopedics;  Laterality: Right;   Social History   Occupational History  . Not on file  Tobacco Use  . Smoking status: Former [facility]  . Smokeless tobacco: Never Used  Vaping Use  . Vaping Use: Never used  Substance and Sexual Activity  . Alcohol use: Yes    Comment: sometimes  . Drug use: Never  . Sexual activity: Not on file

## 2020-02-23 ENCOUNTER — Ambulatory Visit (INDEPENDENT_AMBULATORY_CARE_PROVIDER_SITE_OTHER): Payer: Self-pay | Admitting: Physician Assistant

## 2020-02-23 ENCOUNTER — Encounter: Payer: Self-pay | Admitting: Orthopedic Surgery

## 2020-02-23 DIAGNOSIS — Z89422 Acquired absence of other left toe(s): Secondary | ICD-10-CM

## 2020-02-23 NOTE — Progress Notes (Signed)
Office Visit Note   Patient: Jon House           Date of Birth: 02-09-1971           MRN: [id number] Visit Date: 02/23/2020              Requested by: Cain Saupe, MD [address] [place],  [place] [address] PCP: Cain Saupe, MD  Chief Complaint  Patient presents with  . Right Foot - Pain, Follow-up      HPI: Presents today almost 4 weeks status post irrigation and debridement of right foot abscess.  He is gradually improving.  He has been nonweightbearing and finishing his antibiotics.  He is concerned because the second toe seems to drift downward.  He is using hydrogen peroxide on the wound.  Assessment & Plan: Visit Diagnoses: No diagnosis found.  Plan: Patient was seen today with the use of an interpreter.  I have asked that he not use any hydrogen peroxide on the wound but just cleanse it daily with soap and water and place a dry dressing.  Follow-up in 2 weeks.  Sooner if he has any issues  Follow-Up Instructions: No follow-ups on file.   Ortho Exam  Patient is alert, oriented, no adenopathy, well-dressed, normal affect, normal respiratory effort. Focused examination no cellulitis mild soft tissue swelling he still has some wound healing on the plantar surface but no surrounding cellulitis no fluctuance or purulent discharge.  On the dorsal surface there is an area of 1 cm that is still healing.  This has good vascular fibrinous tissue.  There is no foul odor or drainage.  Mostly all healed with just some mild skin maceration in the web space.  Imaging: No results found. No images are attached to the encounter.  Labs: Lab Results  Component Value Date   HGBA1C 10.2 (A) 07/07/2019   HGBA1C 9.6 (A) 10/02/2018   HGBA1C 11.1 (H) 07/29/2018   ESRSEDRATE 3 07/07/2019   ESRSEDRATE 69 (H) 10/06/2018   ESRSEDRATE 74 (H) 10/05/2018   CRP 13.7 (H) 10/06/2018   CRP 19.7 (H) 10/05/2018   CRP 19.7 (H) 10/04/2018   REPTSTATUS 02/04/2020 FINAL 01/30/2020    GRAMSTAIN  01/30/2020    RARE WBC PRESENT, PREDOMINANTLY PMN RARE GRAM POSITIVE COCCI    CULT  01/30/2020    FEW ENTEROBACTER CLOACAE FEW ENTEROCOCCUS FAECALIS NO ANAEROBES ISOLATED Performed at [facility], [address], [place], [place] [address]    LABORGA ENTEROBACTER CLOACAE 01/30/2020   LABORGA ENTEROCOCCUS FAECALIS 01/30/2020     Lab Results  Component Value Date   ALBUMIN 3.9 10/03/2018   ALBUMIN 4.7 08/05/2018   ALBUMIN 3.9 07/29/2018   PREALBUMIN 9.6 (L) 10/04/2018    No results found for: MG No results found for: VD25OH  Lab Results  Component Value Date   PREALBUMIN 9.6 (L) 10/04/2018   CBC EXTENDED Latest Ref Rng & Units 01/27/2020 10/10/2018 10/08/2018  WBC 4.0 - 10.5 K/uL 5.8 9.5 5.2  RBC 4.22 - 5.81 MIL/uL 4.70 3.74(L) 4.22  HGB 13.0 - 17.0 g/dL 11.9 11.2(L) 12.5(L)  HCT 39 - 52 % 41.4 32.7(L) 36.7(L)  PLT 150 - 400 K/uL 343 305 272  NEUTROABS 1.7 - 7.7 K/uL - - -  LYMPHSABS 0.7 - 4.0 K/uL - - -     There is no height or weight on file to calculate BMI.  Orders:  No orders of the defined types were placed in this encounter.  No orders of  the defined types were placed in this encounter.    Procedures: No procedures performed  Clinical Data: No additional findings.  ROS:  All other systems negative, except as noted in the HPI. Review of Systems  Objective: Vital Signs: There were no vitals taken for this visit.  Specialty Comments:  No specialty comments available.  PMFS History: Patient Active Problem List   Diagnosis Date Noted  . History of amputation of lesser toe of left foot (HCC) 10/16/2018  . Diabetic infection of left foot (HCC) 10/04/2018  . Diabetic infection of right foot (HCC) 10/04/2018  . Diabetic polyneuropathy associated with type 2 diabetes mellitus (HCC)    Past Medical History:  Diagnosis Date  . Diabetes mellitus without complication (HCC)    Type II  . Infection 10/04/2018   LEFT FOOT  .  Osteomyelitis of great toe of left foot (HCC) 07/29/2018  . Subacute osteomyelitis, left ankle and foot (HCC)     Family History  Problem Relation Age of Onset  . Diabetes Mother     Past Surgical History:  Procedure Laterality Date  . AMPUTATION Left 07/31/2018   Procedure: LEFT GREAT TOE AMPUTATION;  Surgeon: [name], MD;  Location: [facility] OR;  Service: Orthopedics;  Laterality: Left;  . AMPUTATION Left 10/09/2018   Procedure: LEFT FOOT 2ND TOE AND POSSIBLE 3RD TOE AMPUTATION;  Surgeon: [name], MD;  Location: [facility] OR;  Service: Orthopedics;  Laterality: Left;  . I & D EXTREMITY Right 01/30/2020   Procedure: DEBRIDEMENT ABSCESS RIGHT FOOT;  Surgeon: [name], MD;  Location: [facility] OR;  Service: Orthopedics;  Laterality: Right;   Social History   Occupational History  . Not on file  Tobacco Use  . Smoking status: Former Games developer  . Smokeless tobacco: Never Used  Vaping Use  . Vaping Use: Never used  Substance and Sexual Activity  . Alcohol use: Yes    Comment: sometimes  . Drug use: Never  . Sexual activity: Not on file

## 2020-03-02 LAB — FUNGUS CULTURE RESULT

## 2020-03-02 LAB — FUNGUS CULTURE WITH STAIN

## 2020-03-02 LAB — FUNGAL ORGANISM REFLEX

## 2020-03-05 ENCOUNTER — Encounter: Payer: Self-pay | Admitting: Physician Assistant

## 2020-03-05 ENCOUNTER — Ambulatory Visit (INDEPENDENT_AMBULATORY_CARE_PROVIDER_SITE_OTHER): Payer: Self-pay | Admitting: Physician Assistant

## 2020-03-05 VITALS — Ht 69.0 in | Wt 158.0 lb

## 2020-03-05 DIAGNOSIS — E11628 Type 2 diabetes mellitus with other skin complications: Secondary | ICD-10-CM

## 2020-03-05 DIAGNOSIS — L089 Local infection of the skin and subcutaneous tissue, unspecified: Secondary | ICD-10-CM

## 2020-03-05 NOTE — Progress Notes (Signed)
Office Visit Note   Patient: Jon House           Date of Birth: [birth year]           MRN: [id number] Visit Date: 03/05/2020              Requested by: Cain Saupe, MD [address] [place],  [place] [address] PCP: Cain Saupe, MD  Chief Complaint  Patient presents with  . Right Foot - Routine Post Op    01/30/20 I&D foot abscess       HPI: Patient presents today 5 weeks status post irrigation and debridement of right foot abscess.  His biggest complaint is of swelling.  He states when he is off his feet or when he gets up in the morning he really does not have much swelling but when he gets up and walks about the swelling increases.  He does not really complain of any pain.  Assessment & Plan: Visit Diagnoses: No diagnosis found.  Plan: I think overall he is doing well we talked about different ways of getting his swelling down.  He will follow-up in 2 weeks.  Follow-Up Instructions: No follow-ups on file.   Ortho Exam  Patient is alert, oriented, no adenopathy, well-dressed, normal affect, normal respiratory effort. He does have quite a bit of swelling.  No tenderness to palpation no palpable fluctuance.  No pain.  He has a strong dorsalis pedis pulse.  He has just one very small area at the proximal end of the incision with a little hyper granulation tissue that is not quite healed yet.  No surrounding cellulitis  Imaging: No results found. No images are attached to the encounter.  Labs: Lab Results  Component Value Date   HGBA1C 10.2 (A) 07/07/2019   HGBA1C 9.6 (A) 10/02/2018   HGBA1C 11.1 (H) 07/29/2018   ESRSEDRATE 3 07/07/2019   ESRSEDRATE 69 (H) 10/06/2018   ESRSEDRATE 74 (H) 10/05/2018   CRP 13.7 (H) 10/06/2018   CRP 19.7 (H) 10/05/2018   CRP 19.7 (H) 10/04/2018   REPTSTATUS 02/04/2020 FINAL 01/30/2020   GRAMSTAIN  01/30/2020    RARE WBC PRESENT, PREDOMINANTLY PMN RARE GRAM POSITIVE COCCI    CULT  01/30/2020    FEW ENTEROBACTER  CLOACAE FEW ENTEROCOCCUS FAECALIS NO ANAEROBES ISOLATED Performed at [facility], [address] [place], [place] [address]    LABORGA ENTEROBACTER CLOACAE 01/30/2020   LABORGA ENTEROCOCCUS FAECALIS 01/30/2020     Lab Results  Component Value Date   ALBUMIN 3.9 10/03/2018   ALBUMIN 4.7 08/05/2018   ALBUMIN 3.9 07/29/2018   PREALBUMIN 9.6 (L) 10/04/2018    No results found for: MG No results found for: VD25OH  Lab Results  Component Value Date   PREALBUMIN 9.6 (L) 10/04/2018   CBC EXTENDED Latest Ref Rng & Units 01/27/2020 10/10/2018 10/08/2018  WBC 4.0 - 10.5 K/uL 5.8 9.5 5.2  RBC 4.22 - 5.81 MIL/uL 4.70 3.74(L) 4.22  HGB 13.0 - 17.0 g/dL 86.5 11.2(L) 12.5(L)  HCT 39 - 52 % 41.4 32.7(L) 36.7(L)  PLT 150 - 400 K/uL 343 305 272  NEUTROABS 1.7 - 7.7 K/uL - - -  LYMPHSABS 0.7 - 4.0 K/uL - - -     Body mass index is 23.33 kg/m.  Orders:  No orders of the defined types were placed in this encounter.  No orders of the defined types were placed in this encounter.    Procedures: No procedures performed  Clinical Data: No additional  findings.  ROS:  All other systems negative, except as noted in the HPI. Review of Systems  Objective: Vital Signs: Ht 5\' 9"  (1.753 m)   Wt 158 lb (71.7 kg)   BMI 23.33 kg/m   Specialty Comments:  No specialty comments available.  PMFS History: Patient Active Problem List   Diagnosis Date Noted  . History of amputation of lesser toe of left foot (HCC) 10/16/2018  . Diabetic infection of left foot (HCC) 10/04/2018  . Diabetic infection of right foot (HCC) 10/04/2018  . Diabetic polyneuropathy associated with type 2 diabetes mellitus (HCC)    Past Medical History:  Diagnosis Date  . Diabetes mellitus without complication (HCC)    Type II  . Infection 10/04/2018   LEFT FOOT  . Osteomyelitis of great toe of left foot (HCC) 07/29/2018  . Subacute osteomyelitis, left ankle and foot (HCC)     Family History  Problem  Relation Age of Onset  . Diabetes Mother     Past Surgical History:  Procedure Laterality Date  . AMPUTATION Left 07/31/2018   Procedure: LEFT GREAT TOE AMPUTATION;  Surgeon: 09/30/2018, MD;  Location: [facility] OR;  Service: Orthopedics;  Laterality: Left;  . AMPUTATION Left 10/09/2018   Procedure: LEFT FOOT 2ND TOE AND POSSIBLE 3RD TOE AMPUTATION;  Surgeon: 12/09/2018, MD;  Location: [facility] OR;  Service: Orthopedics;  Laterality: Left;  . I & D EXTREMITY Right 01/30/2020   Procedure: DEBRIDEMENT ABSCESS RIGHT FOOT;  Surgeon: 03/31/2020, MD;  Location: [facility] OR;  Service: Orthopedics;  Laterality: Right;   Social History   Occupational History  . Not on file  Tobacco Use  . Smoking status: Former [facility]  . Smokeless tobacco: Never Used  Vaping Use  . Vaping Use: Never used  Substance and Sexual Activity  . Alcohol use: Yes    Comment: sometimes  . Drug use: Never  . Sexual activity: Not on file

## 2020-03-15 ENCOUNTER — Ambulatory Visit: Payer: Self-pay | Admitting: Orthopedic Surgery

## 2020-03-15 ENCOUNTER — Ambulatory Visit (INDEPENDENT_AMBULATORY_CARE_PROVIDER_SITE_OTHER): Payer: Self-pay

## 2020-03-15 ENCOUNTER — Ambulatory Visit (INDEPENDENT_AMBULATORY_CARE_PROVIDER_SITE_OTHER): Payer: Self-pay | Admitting: Orthopedic Surgery

## 2020-03-15 ENCOUNTER — Encounter: Payer: Self-pay | Admitting: Orthopedic Surgery

## 2020-03-15 ENCOUNTER — Other Ambulatory Visit: Payer: Self-pay | Admitting: Physician Assistant

## 2020-03-15 VITALS — Ht 69.0 in | Wt 158.0 lb

## 2020-03-15 DIAGNOSIS — M869 Osteomyelitis, unspecified: Secondary | ICD-10-CM

## 2020-03-15 DIAGNOSIS — L02611 Cutaneous abscess of right foot: Secondary | ICD-10-CM

## 2020-03-15 NOTE — Progress Notes (Signed)
-------------  SDW INSTRUCTIONS:  Your procedure is scheduled on 03/17/20.  Report to [facility] Main Entrance "A" at 0800 A.M., and check in at the Admitting office.  Call this number if you have problems the morning of surgery: [phone]   Remember: Do not eat midnight the night before your surgery  You may drink clear liquids until 0730 the morning of your surgery.   Clear liquids allowed are: Water, Non-Citrus Juices (without pulp), Carbonated Beverages, Clear Tea, Black Coffee Only, and Gatorade   Take these medicines the morning of surgery with A SIP OF WATER: -- n/a   insulin NPH-regular Human (70-30) Day before surgery - take AM dose as usual, take 70% of PM dose (14-21 units) Morning of surgery - NONE  ** PLEASE check your blood sugar the morning of your surgery when you wake up and every 2 hours until you get to the Short Stay unit.  If your blood sugar is less than 70 mg/dL, you will need to treat for low blood sugar: - Do not take insulin. - Treat a low blood sugar (less than 70 mg/dL) with  cup of clear juice (cranberry or apple), 4 glucose tablets, OR glucose gel. - Recheck blood sugar in 15 minutes after treatment (to make sure it is greater than 70 mg/dL). If your blood sugar is not greater than 70 mg/dL on recheck, call [phone] for further instructions.  As of today, STOP taking any Aspirin (unless otherwise instructed by your surgeon), Aleve, Naproxen, Ibuprofen, Motrin, Advil, Goody's, BC's, all herbal medications, fish oil, and all vitamins.    The Morning of Surgery  Do not wear jewelry  Do not wear lotions, powders, colognes, or deodorant Men may shave face and neck.  Do not bring valuables to the hospital.  [facility] is not responsible for any belongings or valuables.  If you are a smoker, DO NOT Smoke 24 hours prior to surgery  If you wear a CPAP at night please bring your mask the morning of surgery   Remember that you must have  someone to transport you home after your surgery, and remain with you for 24 hours if you are discharged the same day.  Please bring cases for contacts, glasses, hearing aids, dentures or bridgework because it cannot be worn into surgery.   Leave your suitcase in the car.  After surgery it may be brought to your room.  For patients admitted to the hospital, discharge time will be determined by your treatment team.  Patients discharged the day of surgery will not be allowed to drive home.    Special instructions:   [facility]- Preparing For Surgery  Oral Hygiene is also important to reduce your risk of infection.  Remember - BRUSH YOUR TEETH THE MORNING OF SURGERY WITH YOUR REGULAR TOOTHPASTE  Please follow these instructions carefully.   1. Shower the NIGHT BEFORE SURGERY and the MORNING OF SURGERY with DIAL Soap.   2. Wash thoroughly, paying special attention to the area where your surgery will be performed.  3. Thoroughly rinse your body with warm water from the neck down.  4. Pat yourself dry with a CLEAN TOWEL.  5. Wear CLEAN PAJAMAS to bed the night before surgery  6. Place CLEAN SHEETS on your bed the night of your first shower and DO NOT SLEEP WITH PETS.  7. Wear comfortable clothes the morning of surgery.    Day of Surgery:  Please shower the morning of surgery with the DIAL soap  Do not apply any deodorants/lotions. Please wear clean clothes to the hospital/surgery center.   Remember to brush your teeth WITH YOUR REGULAR TOOTHPASTE.   Please read over the following fact sheets that you were given.  Patient denies shortness of breath, fever, cough and chest pain.

## 2020-03-15 NOTE — Progress Notes (Signed)
Office Visit Note   Patient: Jon House           Date of Birth: 05-20-1970           MRN: [id number] Visit Date: 03/15/2020              Requested by: Cain Saupe, MD [address] [place],  [place] [address] PCP: Cain Saupe, MD  Chief Complaint  Patient presents with  . Right Foot - Routine Post Op    01/30/20 I&D foot abscess       HPI: Patient is a 49 year old gentleman who is status post irrigation and debridement of an abscess of the right foot he presents at this time with a ulcer over the first webspace.  He is weightbearing with a postoperative shoe 1 crutch states he has had recent swelling denies any drainage.  Assessment & Plan: Visit Diagnoses:  1. Cutaneous abscess of right foot     Plan: We will plan for amputation of the right great toe just proximal to the MTP joint.  With the destruction through the MTP joint we will have to amputate just proximal to the first metatarsal head.  Risk and benefits were discussed including persistent infection patient states he understands wished to proceed at this time we will set up surgery for Wednesday.  Follow-Up Instructions: No follow-ups on file.   Ortho Exam  Patient is alert, oriented, no adenopathy, well-dressed, normal affect, normal respiratory effort. Examination patient has a good dorsalis pedis pulse good hair growth.  He has swelling of the right great toe MTP joint.  The plantar ulcers were pared there is no open wounds no purulent drainage.  Patient does have an open wound in the first webspace that probes down to the MTP joint there is no purulent drainage there is good granulation tissue.  No ascending cellulitis.  Patient's last hemoglobin A1c was 10.2.  Imaging: No results found. No images are attached to the encounter.  Labs: Lab Results  Component Value Date   HGBA1C 10.2 (A) 07/07/2019   HGBA1C 9.6 (A) 10/02/2018   HGBA1C 11.1 (H) 07/29/2018   ESRSEDRATE 3 07/07/2019   ESRSEDRATE  69 (H) 10/06/2018   ESRSEDRATE 74 (H) 10/05/2018   CRP 13.7 (H) 10/06/2018   CRP 19.7 (H) 10/05/2018   CRP 19.7 (H) 10/04/2018   REPTSTATUS 02/04/2020 FINAL 01/30/2020   GRAMSTAIN  01/30/2020    RARE WBC PRESENT, PREDOMINANTLY PMN RARE GRAM POSITIVE COCCI    CULT  01/30/2020    FEW ENTEROBACTER CLOACAE FEW ENTEROCOCCUS FAECALIS NO ANAEROBES ISOLATED Performed at [facility], [address], [place], [place] [address]    LABORGA ENTEROBACTER CLOACAE 01/30/2020   LABORGA ENTEROCOCCUS FAECALIS 01/30/2020     Lab Results  Component Value Date   ALBUMIN 3.9 10/03/2018   ALBUMIN 4.7 08/05/2018   ALBUMIN 3.9 07/29/2018   PREALBUMIN 9.6 (L) 10/04/2018    No results found for: MG No results found for: VD25OH  Lab Results  Component Value Date   PREALBUMIN 9.6 (L) 10/04/2018   CBC EXTENDED Latest Ref Rng & Units 01/27/2020 10/10/2018 10/08/2018  WBC 4.0 - 10.5 K/uL 5.8 9.5 5.2  RBC 4.22 - 5.81 MIL/uL 4.70 3.74(L) 4.22  HGB 13.0 - 17.0 g/dL 63.1 11.2(L) 12.5(L)  HCT 39 - 52 % 41.4 32.7(L) 36.7(L)  PLT 150 - 400 K/uL 343 305 272  NEUTROABS 1.7 - 7.7 K/uL - - -  LYMPHSABS 0.7 - 4.0 K/uL - - -  Body mass index is 23.33 kg/m.  Orders:  Orders Placed This Encounter  Procedures  . XR Foot 2 Views Right   No orders of the defined types were placed in this encounter.    Procedures: No procedures performed  Clinical Data: No additional findings.  ROS:  All other systems negative, except as noted in the HPI. Review of Systems  Objective: Vital Signs: Ht 5\' 9"  (1.753 m)   Wt 158 lb (71.7 kg)   BMI 23.33 kg/m   Specialty Comments:  No specialty comments available.  PMFS History: Patient Active Problem List   Diagnosis Date Noted  . History of amputation of lesser toe of left foot (HCC) 10/16/2018  . Diabetic infection of left foot (HCC) 10/04/2018  . Diabetic infection of right foot (HCC) 10/04/2018  . Diabetic polyneuropathy associated with type 2  diabetes mellitus (HCC)    Past Medical History:  Diagnosis Date  . Diabetes mellitus without complication (HCC)    Type II  . Infection 10/04/2018   LEFT FOOT  . Osteomyelitis of great toe of left foot (HCC) 07/29/2018  . Subacute osteomyelitis, left ankle and foot (HCC)     Family History  Problem Relation Age of Onset  . Diabetes Mother     Past Surgical History:  Procedure Laterality Date  . AMPUTATION Left 07/31/2018   Procedure: LEFT GREAT TOE AMPUTATION;  Surgeon: 09/30/2018, MD;  Location: [facility] OR;  Service: Orthopedics;  Laterality: Left;  . AMPUTATION Left 10/09/2018   Procedure: LEFT FOOT 2ND TOE AND POSSIBLE 3RD TOE AMPUTATION;  Surgeon: 12/09/2018, MD;  Location: [facility] OR;  Service: Orthopedics;  Laterality: Left;  . I & D EXTREMITY Right 01/30/2020   Procedure: DEBRIDEMENT ABSCESS RIGHT FOOT;  Surgeon: 03/31/2020, MD;  Location: [facility] OR;  Service: Orthopedics;  Laterality: Right;   Social History   Occupational History  . Not on file  Tobacco Use  . Smoking status: Former [facility]  . Smokeless tobacco: Never Used  Vaping Use  . Vaping Use: Never used  Substance and Sexual Activity  . Alcohol use: Yes    Comment: sometimes  . Drug use: Never  . Sexual activity: Not on file

## 2020-03-16 ENCOUNTER — Other Ambulatory Visit (HOSPITAL_COMMUNITY)
Admission: RE | Admit: 2020-03-16 | Discharge: 2020-03-16 | Disposition: A | Discharge status: Home / Self Care | Payer: HRSA Program | Source: Ambulatory Visit | Attending: Orthopedic Surgery | Admitting: Orthopedic Surgery

## 2020-03-16 ENCOUNTER — Other Ambulatory Visit: Payer: Self-pay

## 2020-03-16 ENCOUNTER — Encounter (HOSPITAL_COMMUNITY): Payer: Self-pay | Admitting: Orthopedic Surgery

## 2020-03-16 DIAGNOSIS — Z20822 Contact with and (suspected) exposure to covid-19: Secondary | ICD-10-CM | POA: Insufficient documentation

## 2020-03-16 DIAGNOSIS — Z01812 Encounter for preprocedural laboratory examination: Secondary | ICD-10-CM | POA: Insufficient documentation

## 2020-03-16 LAB — SARS CORONAVIRUS 2 (TAT 6-24 HRS): SARS Coronavirus 2: NEGATIVE

## 2020-03-16 NOTE — Progress Notes (Signed)
I spoke to Mr Jon House using pacific interpreter, [place]- ID# [id number]. Patient denies any chest pain or shortness of breath, no s/s of C ovis; patient is scheduled for Covid test today at 1230 and is aware that he needs to quarantine after tested.]  Mr. Jon House has type II diabetes, patient reports that CBGs run 120-200. (120 in am).  I instructed patient to take 70% of 70/30 Insulin this evening - 21 units and to not take insulin in am. I instructed patient to check CBG after awaking and every 2 hours until arrival  to the hospital.  I Instructed patient if CBG is less than 70 to  Tablets or drink  1/2 cup of a clear juice. Recheck CBG in 15 minutes then call pre- op desk at [phone] for further instructions.

## 2020-03-17 ENCOUNTER — Ambulatory Visit (HOSPITAL_COMMUNITY): Payer: Self-pay | Admitting: Certified Registered"

## 2020-03-17 ENCOUNTER — Ambulatory Visit (HOSPITAL_COMMUNITY)
Admission: RE | Admit: 2020-03-17 | Discharge: 2020-03-17 | Disposition: A | Discharge status: Home / Self Care | Payer: Self-pay | Source: Ambulatory Visit | Attending: Orthopedic Surgery | Admitting: Orthopedic Surgery

## 2020-03-17 ENCOUNTER — Encounter (HOSPITAL_COMMUNITY): Payer: Self-pay | Admitting: Orthopedic Surgery

## 2020-03-17 ENCOUNTER — Encounter (HOSPITAL_COMMUNITY)
Admission: RE | Disposition: A | Discharge status: Home / Self Care | Payer: Self-pay | Source: Ambulatory Visit | Attending: Orthopedic Surgery

## 2020-03-17 DIAGNOSIS — Z87891 Personal history of nicotine dependence: Secondary | ICD-10-CM | POA: Insufficient documentation

## 2020-03-17 DIAGNOSIS — Z888 Allergy status to other drugs, medicaments and biological substances status: Secondary | ICD-10-CM | POA: Insufficient documentation

## 2020-03-17 DIAGNOSIS — Z89412 Acquired absence of left great toe: Secondary | ICD-10-CM | POA: Insufficient documentation

## 2020-03-17 DIAGNOSIS — Z89422 Acquired absence of other left toe(s): Secondary | ICD-10-CM | POA: Insufficient documentation

## 2020-03-17 DIAGNOSIS — M869 Osteomyelitis, unspecified: Secondary | ICD-10-CM

## 2020-03-17 DIAGNOSIS — E1169 Type 2 diabetes mellitus with other specified complication: Secondary | ICD-10-CM | POA: Insufficient documentation

## 2020-03-17 DIAGNOSIS — Z91030 Bee allergy status: Secondary | ICD-10-CM | POA: Insufficient documentation

## 2020-03-17 HISTORY — PX: APPLICATION OF WOUND VAC: SHX5189

## 2020-03-17 HISTORY — PX: AMPUTATION: SHX166

## 2020-03-17 LAB — BASIC METABOLIC PANEL
Anion gap: 8 (ref 5–15)
BUN: 15 mg/dL (ref 6–20)
CO2: 26 mmol/L (ref 22–32)
Calcium: 9 mg/dL (ref 8.9–10.3)
Chloride: 103 mmol/L (ref 98–111)
Creatinine, Ser: 0.74 mg/dL (ref 0.61–1.24)
GFR, Estimated: >60 mL/min (ref >60)
Glucose, Bld: 160 mg/dL — ABNORMAL HIGH (ref 70–99)
Potassium: 4.4 mmol/L (ref 3.5–5.1)
Sodium: 137 mmol/L (ref 135–145)

## 2020-03-17 LAB — GLUCOSE, CAPILLARY
Glucose-Capillary: 163 mg/dL — ABNORMAL HIGH (ref 70–99)
Glucose-Capillary: 179 mg/dL — ABNORMAL HIGH (ref 70–99)

## 2020-03-17 SURGERY — AMPUTATION DIGIT
Anesthesia: General | Site: Foot | Laterality: Right

## 2020-03-17 MED ORDER — OXYCODONE-ACETAMINOPHEN 5-325 MG PO TABS: 1.0000 | ORAL_TABLET | ORAL | 0 refills | Status: DC | PRN

## 2020-03-17 MED ORDER — MIDAZOLAM HCL 2 MG/2ML IJ SOLN
INTRAMUSCULAR | Status: AC
  Filled 2020-03-17: qty 2

## 2020-03-17 MED ORDER — DEXAMETHASONE SODIUM PHOSPHATE 10 MG/ML IJ SOLN
INTRAMUSCULAR | Status: AC
  Filled 2020-03-17: qty 1

## 2020-03-17 MED ORDER — ONDANSETRON HCL 4 MG/2ML IJ SOLN
INTRAMUSCULAR | Status: DC | PRN
  Administered 2020-03-17: 4 mg via INTRAVENOUS

## 2020-03-17 MED ORDER — FENTANYL CITRATE (PF) 100 MCG/2ML IJ SOLN: 25.0000 ug | INTRAMUSCULAR | Status: DC | PRN

## 2020-03-17 MED ORDER — 0.9 % SODIUM CHLORIDE (POUR BTL) OPTIME
TOPICAL | Status: DC | PRN
  Administered 2020-03-17: 1000 mL

## 2020-03-17 MED ORDER — CHLORHEXIDINE GLUCONATE 0.12 % MT SOLN
OROMUCOSAL | Status: AC
  Administered 2020-03-17: 15 mL via OROMUCOSAL
  Filled 2020-03-17: qty 15

## 2020-03-17 MED ORDER — FENTANYL CITRATE (PF) 100 MCG/2ML IJ SOLN
INTRAMUSCULAR | Status: DC | PRN
  Administered 2020-03-17: 50 ug via INTRAVENOUS

## 2020-03-17 MED ORDER — CEFAZOLIN SODIUM-DEXTROSE 2-4 GM/100ML-% IV SOLN
INTRAVENOUS | Status: AC
  Filled 2020-03-17: qty 100

## 2020-03-17 MED ORDER — ORAL CARE MOUTH RINSE: 15.0000 mL | Freq: Once | OROMUCOSAL | Status: AC

## 2020-03-17 MED ORDER — LIDOCAINE 2% (20 MG/ML) 5 ML SYRINGE
INTRAMUSCULAR | Status: DC | PRN
  Administered 2020-03-17: 60 mg via INTRAVENOUS

## 2020-03-17 MED ORDER — FENTANYL CITRATE (PF) 250 MCG/5ML IJ SOLN
INTRAMUSCULAR | Status: AC
  Filled 2020-03-17: qty 5

## 2020-03-17 MED ORDER — LACTATED RINGERS IV SOLN: INTRAVENOUS | Status: DC

## 2020-03-17 MED ORDER — LIDOCAINE 2% (20 MG/ML) 5 ML SYRINGE
INTRAMUSCULAR | Status: AC
  Filled 2020-03-17: qty 5

## 2020-03-17 MED ORDER — ONDANSETRON HCL 4 MG/2ML IJ SOLN
INTRAMUSCULAR | Status: AC
  Filled 2020-03-17: qty 2

## 2020-03-17 MED ORDER — PROPOFOL 10 MG/ML IV BOLUS
INTRAVENOUS | Status: AC
  Filled 2020-03-17: qty 20

## 2020-03-17 MED ORDER — PHENYLEPHRINE 40 MCG/ML (10ML) SYRINGE FOR IV PUSH (FOR BLOOD PRESSURE SUPPORT)
PREFILLED_SYRINGE | INTRAVENOUS | Status: DC | PRN
  Administered 2020-03-17 (×2): 40 ug via INTRAVENOUS

## 2020-03-17 MED ORDER — PROPOFOL 10 MG/ML IV BOLUS
INTRAVENOUS | Status: DC | PRN
  Administered 2020-03-17: 40 mg via INTRAVENOUS
  Administered 2020-03-17: 160 mg via INTRAVENOUS

## 2020-03-17 MED ORDER — DEXAMETHASONE SODIUM PHOSPHATE 10 MG/ML IJ SOLN
INTRAMUSCULAR | Status: DC | PRN
  Administered 2020-03-17: 5 mg via INTRAVENOUS

## 2020-03-17 MED ORDER — CHLORHEXIDINE GLUCONATE 0.12 % MT SOLN: 15.0000 mL | Freq: Once | OROMUCOSAL | Status: AC

## 2020-03-17 MED ORDER — EPHEDRINE SULFATE-NACL 50-0.9 MG/10ML-% IV SOSY
PREFILLED_SYRINGE | INTRAVENOUS | Status: DC | PRN
  Administered 2020-03-17: 5 mg via INTRAVENOUS

## 2020-03-17 MED ORDER — MIDAZOLAM HCL 5 MG/5ML IJ SOLN
INTRAMUSCULAR | Status: DC | PRN
  Administered 2020-03-17: 2 mg via INTRAVENOUS

## 2020-03-17 MED ORDER — CEFAZOLIN SODIUM-DEXTROSE 2-4 GM/100ML-% IV SOLN
2.0000 g | INTRAVENOUS | Status: AC
  Administered 2020-03-17: 2 g via INTRAVENOUS

## 2020-03-17 SURGICAL SUPPLY — 36 items
BLADE AVERAGE 25MMX9MM (BLADE) ×1
BLADE AVERAGE 25X9 (BLADE) ×2 IMPLANT
BLADE SURG 21 STRL SS (BLADE) ×3 IMPLANT
BNDG COHESIVE 4X5 TAN STRL (GAUZE/BANDAGES/DRESSINGS) ×3 IMPLANT
BNDG ESMARK 4X9 LF (GAUZE/BANDAGES/DRESSINGS) IMPLANT
BNDG GAUZE ELAST 4 BULKY (GAUZE/BANDAGES/DRESSINGS) ×3 IMPLANT
COVER SURGICAL LIGHT HANDLE (MISCELLANEOUS) ×6 IMPLANT
COVER WAND RF STERILE (DRAPES) ×3 IMPLANT
DRAPE DERMATAC (DRAPES) ×3 IMPLANT
DRAPE U-SHAPE 47X51 STRL (DRAPES) ×3 IMPLANT
DRSG ADAPTIC 3X8 NADH LF (GAUZE/BANDAGES/DRESSINGS) IMPLANT
DRSG PAD ABDOMINAL 8X10 ST (GAUZE/BANDAGES/DRESSINGS) ×3 IMPLANT
DURAPREP 26ML APPLICATOR (WOUND CARE) ×3 IMPLANT
ELECT REM PT RETURN 9FT ADLT (ELECTROSURGICAL) ×3
ELECTRODE REM PT RTRN 9FT ADLT (ELECTROSURGICAL) ×1 IMPLANT
GAUZE SPONGE 4X4 12PLY STRL (GAUZE/BANDAGES/DRESSINGS) IMPLANT
GLOVE BIOGEL PI IND STRL 9 (GLOVE) ×1 IMPLANT
GLOVE BIOGEL PI INDICATOR 9 (GLOVE) ×2
GLOVE SURG ORTHO 9.0 STRL STRW (GLOVE) ×3 IMPLANT
GOWN STRL REUS W/ TWL XL LVL3 (GOWN DISPOSABLE) ×2 IMPLANT
GOWN STRL REUS W/TWL XL LVL3 (GOWN DISPOSABLE) ×6
KIT BASIN OR (CUSTOM PROCEDURE TRAY) ×3 IMPLANT
KIT DRSG PREVENA PLUS 7DAY 125 (MISCELLANEOUS) ×3 IMPLANT
KIT PREVENA INCISION MGT 13 (CANNISTER) ×3 IMPLANT
KIT TURNOVER KIT B (KITS) ×3 IMPLANT
MANIFOLD NEPTUNE II (INSTRUMENTS) ×3 IMPLANT
NEEDLE 22X1 1/2 (OR ONLY) (NEEDLE) IMPLANT
NS IRRIG 1000ML POUR BTL (IV SOLUTION) ×3 IMPLANT
PACK ORTHO EXTREMITY (CUSTOM PROCEDURE TRAY) ×3 IMPLANT
PAD ARMBOARD 7.5X6 YLW CONV (MISCELLANEOUS) ×6 IMPLANT
SUT ETHILON 2 0 PSLX (SUTURE) ×3 IMPLANT
SYR CONTROL 10ML LL (SYRINGE) IMPLANT
TOWEL GREEN STERILE (TOWEL DISPOSABLE) ×3 IMPLANT
TUBE CONNECTING 12'X1/4 (SUCTIONS) ×1
TUBE CONNECTING 12X1/4 (SUCTIONS) ×2 IMPLANT
YANKAUER SUCT BULB TIP NO VENT (SUCTIONS) ×3 IMPLANT

## 2020-03-17 NOTE — Transfer of Care (Signed)
Immediate Anesthesia Transfer of Care Note  Patient: Jon House  Procedure(s) Performed: RIGHT GREAT TOE AMPUTATION THROUGH METATARSAL (Right Foot) APPLICATION OF WOUND VAC (Right Foot)  Patient Location: PACU  Anesthesia Type:General  Level of Consciousness: drowsy and patient cooperative  Airway & Oxygen Therapy: Patient Spontanous Breathing and Patient connected to face mask oxygen  Post-op Assessment: Report given to RN and Post -op Vital signs reviewed and stable  Post vital signs: Reviewed and stable  Last Vitals:  Vitals Value Taken Time  BP 109/61 03/17/20 1053  Temp    Pulse 87 03/17/20 1054  Resp 16 03/17/20 1054  SpO2 100 % 03/17/20 1054  Vitals shown include unvalidated device data.  Last Pain:  Vitals:   03/17/20 0912  TempSrc:   PainSc: 0-No pain         Complications: No complications documented.

## 2020-03-17 NOTE — Anesthesia Postprocedure Evaluation (Signed)
Anesthesia Post Note  Patient: Jon House  Procedure(s) Performed: RIGHT GREAT TOE AMPUTATION THROUGH METATARSAL (Right Foot) APPLICATION OF WOUND VAC (Right Foot)     Patient location during evaluation: PACU Anesthesia Type: General Level of consciousness: awake Pain management: pain level controlled Vital Signs Assessment: post-procedure vital signs reviewed and stable Respiratory status: spontaneous breathing Cardiovascular status: stable Postop Assessment: no apparent nausea or vomiting Anesthetic complications: no   No complications documented.  Last Vitals:  Vitals:   03/17/20 1108 03/17/20 1122  BP: 125/79 (!) 143/89  Pulse: 89 92  Resp: 13 13  Temp:  36.7 C  SpO2: 99% 99%    Last Pain:  Vitals:   03/17/20 1122  TempSrc:   PainSc: 0-No pain                 Jevante Hollibaugh

## 2020-03-17 NOTE — Anesthesia Procedure Notes (Signed)
Procedure Name: LMA Insertion Date/Time: 03/17/2020 10:30 AM Performed by: Marny Lowenstein, CRNA Pre-anesthesia Checklist: Patient identified, Emergency Drugs available, Suction available and Patient being monitored Patient Re-evaluated:Patient Re-evaluated prior to induction Oxygen Delivery Method: Circle system utilized Preoxygenation: Pre-oxygenation with 100% oxygen Induction Type: IV induction Ventilation: Mask ventilation without difficulty LMA: LMA inserted LMA Size: 4.0 Number of attempts: 1 Placement Confirmation: positive ETCO2 and breath sounds checked- equal and bilateral Tube secured with: Tape Dental Injury: Teeth and Oropharynx as per pre-operative assessment

## 2020-03-17 NOTE — Op Note (Signed)
03/17/2020  11:04 AM  PATIENT:  Jon House    PRE-OPERATIVE DIAGNOSIS:  Osteomyelitis Right Great Toe  POST-OPERATIVE DIAGNOSIS:  Same  PROCEDURE:  RIGHT GREAT TOE AMPUTATION THROUGH METATARSAL, APPLICATION OF WOUND VAC  Local tissue rearrangement for wound closure 9 x 5 cm.  SURGEON:  [name], MD  PHYSICIAN ASSISTANT:None ANESTHESIA:   General  PREOPERATIVE INDICATIONS:  Jon House is a  49 y.o. male with a diagnosis of Osteomyelitis Right Great Toe who failed conservative measures and elected for surgical management.    The risks benefits and alternatives were discussed with the patient preoperatively including but not limited to the risks of infection, bleeding, nerve injury, cardiopulmonary complications, the need for revision surgery, among others, and the patient was willing to proceed.  OPERATIVE IMPLANTS: vac 13 cm  @ENCIMAGES @  OPERATIVE FINDINGS: Resection margins clear healthy soft tissue at the level of amputation  OPERATIVE PROCEDURE: Patient was brought the operating room and underwent general anesthetic.  After adequate levels anesthesia obtained patient's right lower extremity was prepped using DuraPrep draped into a sterile field a timeout was called.  A racquet incision was made around the ulcerative tissue of the great toe and the first ray was resected through the midshaft beveled plantarly and the abscess necrotic tissue and infected bone was resected in 1 block of tissue.  Electrocautery was used hemostasis the wound was irrigated with normal saline.  Local tissue rearrangement was used to close the wound that was 9 x 5 cm.  A 13 cm Prevena wound VAC was applied this had a good suction fit patient was extubated taken the PACU in stable condition   DISCHARGE PLANNING:  Antibiotic duration: Preoperative antibiotics  Weightbearing: Nonweightbearing on the right  Pain medication: Prescription for Percocet  Dressing care/ Wound VAC:  Continue wound VAC for 1 week  Ambulatory devices: Crutches  Discharge to: Home.  Follow-up: In the office 1 week post operative.

## 2020-03-17 NOTE — H&P (Signed)
Jon House is an 49 y.o. male.   Chief Complaint: osteomyelitis Right Great Toe HPI: Patient is a 49 year old gentleman who is status post irrigation and debridement of an abscess of the right foot he presents at this time with a ulcer over the first webspace.  He is weightbearing with a postoperative shoe 1 crutch states he has had recent swelling denies any drainage.  Past Medical History:  Diagnosis Date  . Diabetes mellitus without complication (HCC)    Type II  . Infection 10/04/2018   LEFT FOOT  . Osteomyelitis of great toe of left foot (HCC) 07/29/2018  . Subacute osteomyelitis, left ankle and foot [facility])     Past Surgical History:  Procedure Laterality Date  . AMPUTATION Left 07/31/2018   Procedure: LEFT GREAT TOE AMPUTATION;  Surgeon: [name], MD;  Location: [facility] OR;  Service: Orthopedics;  Laterality: Left;  . AMPUTATION Left 10/09/2018   Procedure: LEFT FOOT 2ND TOE AND POSSIBLE 3RD TOE AMPUTATION;  Surgeon: [name], MD;  Location: [facility] OR;  Service: Orthopedics;  Laterality: Left;  . I & D EXTREMITY Right 01/30/2020   Procedure: DEBRIDEMENT ABSCESS RIGHT FOOT;  Surgeon: [name], MD;  Location: [facility] OR;  Service: Orthopedics;  Laterality: Right;    Family History  Problem Relation Age of Onset  . Diabetes Mother    Social History:  reports that he has quit smoking. He quit after 15.00 years of use. He has never used smokeless tobacco. He reports current alcohol use. He reports that he does not use drugs.  Allergies:  Allergies  Allergen Reactions  . Bee Venom Hives  . Metformin And Related Diarrhea    No medications prior to admission.    Results for orders placed or performed during the hospital encounter of 03/16/20 (from the past 48 hour(s))  SARS CORONAVIRUS 2 (TAT 6-24 HRS) Nasopharyngeal Nasopharyngeal Swab     Status: None   Collection Time: 03/16/20 12:29 PM   Specimen: Nasopharyngeal Swab  Result Value Ref Range   SARS Coronavirus 2  NEGATIVE NEGATIVE    Comment: (NOTE) SARS-CoV-2 target nucleic acids are NOT DETECTED.  The SARS-CoV-2 RNA is generally detectable in upper and lower respiratory specimens during the acute phase of infection. Negative results do not preclude SARS-CoV-2 infection, do not rule out co-infections with other pathogens, and should not be used as the sole basis for treatment or other patient management decisions. Negative results must be combined with clinical observations, patient history, and epidemiological information. The expected result is Negative.  Fact Sheet for Patients: HairSlick.no  Fact Sheet for Healthcare Providers: quierodirigir.com  This test is not yet approved or cleared by the [place] FDA and  has been authorized for detection and/or diagnosis of SARS-CoV-2 by FDA under an Emergency Use Authorization (EUA). This EUA will remain  in effect (meaning this test can be used) for the duration of the COVID-19 declaration under Se ction 564(b)(1) of the Act, 21 U.S.C. section 360bbb-3(b)(1), unless the authorization is terminated or revoked sooner.  Performed at [facility], [address], [place], [place] [address]    XR Foot 2 Views Right  Result Date: 03/15/2020 2 view radiographs of the right foot shows extensive destruction at the base of the proximal phalanx right great toe consistent with osteomyelitis.  No periarticular cystic changes no clinical signs of gout.   Review of Systems  All other systems reviewed and are negative.   There were no vitals taken  for this visit. Physical Exam  Patient is alert, oriented, no adenopathy, well-dressed, normal affect, normal respiratory effort. Examination patient has a good dorsalis pedis pulse good hair growth.  He has swelling of the right great toe MTP joint.  The plantar ulcers were pared there is no open wounds no purulent drainage.  Patient does have  an open wound in the first webspace that probes down to the MTP joint there is no purulent drainage there is good granulation tissue.  No ascending cellulitis.  Patient's last hemoglobin A1c was 10.2.Heart RRRLungs clear Assessment/Plan Visit Diagnoses:  1. Cutaneous abscess of right foot     Plan: We will plan for amputation of the right great toe just proximal to the MTP joint.  With the destruction through the MTP joint we will have to amputate just proximal to the first metatarsal head.  Risk and benefits were discussed including persistent infection patient states he understands wished to proceed at this time we will set up surgery for Wednesday.   [place] Olanda Boughner, PA 03/17/2020, 6:38 AM

## 2020-03-17 NOTE — Anesthesia Preprocedure Evaluation (Addendum)
Anesthesia Evaluation  Patient identified by MRN, date of birth, ID band Patient awake    Reviewed: Allergy & Precautions, NPO status , Patient's Chart, lab work & pertinent test results  Airway Mallampati: II  TM Distance: >3 FB     Dental   Pulmonary former smoker,    breath sounds clear to auscultation       Cardiovascular negative cardio ROS   Rhythm:Regular Rate:Normal     Neuro/Psych  Neuromuscular disease    GI/Hepatic negative GI ROS, Neg liver ROS,   Endo/Other  diabetes  Renal/GU      Musculoskeletal   Abdominal   Peds  Hematology   Anesthesia Other Findings   Reproductive/Obstetrics                             Anesthesia Physical Anesthesia Plan  ASA: III  Anesthesia Plan: General   Post-op Pain Management:    Induction: Intravenous  PONV Risk Score and Plan: 2 and Ondansetron, Dexamethasone and Midazolam  Airway Management Planned: LMA  Additional Equipment:   Intra-op Plan:   Post-operative Plan: Extubation in OR  Informed Consent: I have reviewed the patients History and Physical, chart, labs and discussed the procedure including the risks, benefits and alternatives for the proposed anesthesia with the patient or authorized representative who has indicated his/her understanding and acceptance.     Dental advisory given  Plan Discussed with: CRNA and Anesthesiologist  Anesthesia Plan Comments:        Anesthesia Quick Evaluation

## 2020-03-17 NOTE — Interval H&P Note (Signed)
History and Physical Interval Note:  03/17/2020 10:08 AM  Jon House  has presented today for surgery, with the diagnosis of Osteomyelitis Right Great Toe.  The various methods of treatment have been discussed with the patient and family. After consideration of risks, benefits and other options for treatment, the patient has consented to  Procedure(s): RIGHT GREAT TOE AMPUTATION THROUGH METATARSAL (Right) as a surgical intervention.  The patient's history has been reviewed, patient examined, no change in status, stable for surgery.  I have reviewed the patient's chart and labs.  Questions were answered to the patient's satisfaction.     [name]

## 2020-03-18 ENCOUNTER — Encounter (HOSPITAL_COMMUNITY): Payer: Self-pay | Admitting: Orthopedic Surgery

## 2020-03-18 ENCOUNTER — Ambulatory Visit: Payer: Self-pay | Admitting: Orthopedic Surgery

## 2020-03-23 ENCOUNTER — Encounter: Payer: Self-pay | Admitting: Physician Assistant

## 2020-03-23 ENCOUNTER — Ambulatory Visit (INDEPENDENT_AMBULATORY_CARE_PROVIDER_SITE_OTHER): Payer: Self-pay | Admitting: Physician Assistant

## 2020-03-23 DIAGNOSIS — M869 Osteomyelitis, unspecified: Secondary | ICD-10-CM

## 2020-03-23 NOTE — Progress Notes (Signed)
Office Visit Note   Patient: Jon House           Date of Birth: 1970/07/06           MRN: [id number] Visit Date: 03/23/2020              Requested by: Cain Saupe, MD [address] [place],  [place] [address] PCP: Patient, No Pcp Per  Chief Complaint  Patient presents with  . Right Foot - Routine Post Op, Wound Check    Right great toe amp with wound vac.       HPI: This is a pleasant 49 year old gentleman who is 1 week status post great toe amputation.  He still has a wound VAC.  Assessment & Plan: Visit Diagnoses: No diagnosis found.  Plan: I encourage patient to elevate his foot.  He and I discussed with his interpreter he that he should be drinking to low-carb shakes such as Glucerna twice daily.  Also will begin daily washing of his foot with antibacterial soap.  Also emphasized glucose control.  We will follow up in 1 week.  Follow-Up Instructions: No follow-ups on file.   Ortho Exam  Patient is alert, oriented, no adenopathy, well-dressed, normal affect, normal respiratory effort. Moderate soft tissue swelling no cellulitis wound edges are well opposed.  Distally he has some skin maceration after the VAC was removed.  No signs of infection  Imaging: No results found. No images are attached to the encounter.  Labs: Lab Results  Component Value Date   HGBA1C 10.2 (A) 07/07/2019   HGBA1C 9.6 (A) 10/02/2018   HGBA1C 11.1 (H) 07/29/2018   ESRSEDRATE 3 07/07/2019   ESRSEDRATE 69 (H) 10/06/2018   ESRSEDRATE 74 (H) 10/05/2018   CRP 13.7 (H) 10/06/2018   CRP 19.7 (H) 10/05/2018   CRP 19.7 (H) 10/04/2018   REPTSTATUS 02/04/2020 FINAL 01/30/2020   GRAMSTAIN  01/30/2020    RARE WBC PRESENT, PREDOMINANTLY PMN RARE GRAM POSITIVE COCCI    CULT  01/30/2020    FEW ENTEROBACTER CLOACAE FEW ENTEROCOCCUS FAECALIS NO ANAEROBES ISOLATED Performed at [facility], [address] [place], [place] [address]    LABORGA ENTEROBACTER CLOACAE 01/30/2020     LABORGA ENTEROCOCCUS FAECALIS 01/30/2020     Lab Results  Component Value Date   ALBUMIN 3.9 10/03/2018   ALBUMIN 4.7 08/05/2018   ALBUMIN 3.9 07/29/2018   PREALBUMIN 9.6 (L) 10/04/2018    No results found for: MG No results found for: VD25OH  Lab Results  Component Value Date   PREALBUMIN 9.6 (L) 10/04/2018   CBC EXTENDED Latest Ref Rng & Units 01/27/2020 10/10/2018 10/08/2018  WBC 4.0 - 10.5 K/uL 5.8 9.5 5.2  RBC 4.22 - 5.81 MIL/uL 4.70 3.74(L) 4.22  HGB 13.0 - 17.0 g/dL 86.5 11.2(L) 12.5(L)  HCT 39 - 52 % 41.4 32.7(L) 36.7(L)  PLT 150 - 400 K/uL 343 305 272  NEUTROABS 1.7 - 7.7 K/uL - - -  LYMPHSABS 0.7 - 4.0 K/uL - - -     There is no height or weight on file to calculate BMI.  Orders:  No orders of the defined types were placed in this encounter.  No orders of the defined types were placed in this encounter.    Procedures: No procedures performed  Clinical Data: No additional findings.  ROS:  All other systems negative, except as noted in the HPI. Review of Systems  Objective: Vital Signs: There were no vitals taken for this visit.  Specialty  Comments:  No specialty comments available.  PMFS History: Patient Active Problem List   Diagnosis Date Noted  . Osteomyelitis of great toe of right foot (HCC)   . History of amputation of lesser toe of left foot (HCC) 10/16/2018  . Diabetic infection of left foot (HCC) 10/04/2018  . Diabetic infection of right foot (HCC) 10/04/2018  . Diabetic polyneuropathy associated with type 2 diabetes mellitus (HCC)    Past Medical History:  Diagnosis Date  . Diabetes mellitus without complication (HCC)    Type II  . Infection 10/04/2018   LEFT FOOT  . Osteomyelitis of great toe of left foot (HCC) 07/29/2018  . Subacute osteomyelitis, left ankle and foot (HCC)     Family History  Problem Relation Age of Onset  . Diabetes Mother     Past Surgical History:  Procedure Laterality Date  . AMPUTATION Left 07/31/2018    Procedure: LEFT GREAT TOE AMPUTATION;  Surgeon: [name], MD;  Location: [facility] OR;  Service: Orthopedics;  Laterality: Left;  . AMPUTATION Left 10/09/2018   Procedure: LEFT FOOT 2ND TOE AND POSSIBLE 3RD TOE AMPUTATION;  Surgeon: [name], MD;  Location: [facility] OR;  Service: Orthopedics;  Laterality: Left;  . AMPUTATION Right 03/17/2020   Procedure: RIGHT GREAT TOE AMPUTATION THROUGH METATARSAL;  Surgeon: [name], MD;  Location: [facility] OR;  Service: Orthopedics;  Laterality: Right;  . APPLICATION OF WOUND VAC Right 03/17/2020   Procedure: APPLICATION OF WOUND VAC;  Surgeon: [name], MD;  Location: [facility] OR;  Service: Orthopedics;  Laterality: Right;  . I & D EXTREMITY Right 01/30/2020   Procedure: DEBRIDEMENT ABSCESS RIGHT FOOT;  Surgeon: [name], MD;  Location: [facility] OR;  Service: Orthopedics;  Laterality: Right;   Social History   Occupational History  . Not on file  Tobacco Use  . Smoking status: Former Smoker    Years: 15.00  . Smokeless tobacco: Never Used  . Tobacco comment: 03/16/20- quit 12- 15 years ago  Vaping Use  . Vaping Use: Never used  Substance and Sexual Activity  . Alcohol use: Yes    Comment: sometimes  . Drug use: Never  . Sexual activity: Not on file

## 2020-03-30 ENCOUNTER — Ambulatory Visit (INDEPENDENT_AMBULATORY_CARE_PROVIDER_SITE_OTHER): Payer: Self-pay | Admitting: Physician Assistant

## 2020-03-30 ENCOUNTER — Encounter: Payer: Self-pay | Admitting: Family

## 2020-03-30 VITALS — Ht 69.0 in | Wt 158.0 lb

## 2020-03-30 DIAGNOSIS — M869 Osteomyelitis, unspecified: Secondary | ICD-10-CM

## 2020-03-30 NOTE — Progress Notes (Signed)
Office Visit Note   Patient: Jon House           Date of Birth: [birth year]           MRN: [id number] Visit Date: 03/30/2020              Requested by: No referring provider defined for this encounter. PCP: Patient, No Pcp Per  Chief Complaint  Patient presents with  . Right Foot - Routine Post Op    03/17/20 right GT amputation       HPI: The patient presents today 2 weeks status post right great toe amputation.  He has no complaints.  Assessment & Plan: Visit Diagnoses: No diagnosis found.  Plan: Continue offload weightbearing will follow up in 1 week for suture removal Follow-Up Instructions: No follow-ups on file.   Ortho Exam  Patient is alert, oriented, no adenopathy, well-dressed, normal affect, normal respiratory effort. Examination of the wound overall with some well apposed wound edges there is an area of dehiscence with a suture across.  No cellulitis no foul odor no signs of infection at this point  Imaging: No results found. No images are attached to the encounter.  Labs: Lab Results  Component Value Date   HGBA1C 10.2 (A) 07/07/2019   HGBA1C 9.6 (A) 10/02/2018   HGBA1C 11.1 (H) 07/29/2018   ESRSEDRATE 3 07/07/2019   ESRSEDRATE 69 (H) 10/06/2018   ESRSEDRATE 74 (H) 10/05/2018   CRP 13.7 (H) 10/06/2018   CRP 19.7 (H) 10/05/2018   CRP 19.7 (H) 10/04/2018   REPTSTATUS 02/04/2020 FINAL 01/30/2020   GRAMSTAIN  01/30/2020    RARE WBC PRESENT, PREDOMINANTLY PMN RARE GRAM POSITIVE COCCI    CULT  01/30/2020    FEW ENTEROBACTER CLOACAE FEW ENTEROCOCCUS FAECALIS NO ANAEROBES ISOLATED Performed at [facility], [address] [place], [place] [address]    LABORGA ENTEROBACTER CLOACAE 01/30/2020   LABORGA ENTEROCOCCUS FAECALIS 01/30/2020     Lab Results  Component Value Date   ALBUMIN 3.9 10/03/2018   ALBUMIN 4.7 08/05/2018   ALBUMIN 3.9 07/29/2018   PREALBUMIN 9.6 (L) 10/04/2018    No results found for: MG No results found  for: VD25OH  Lab Results  Component Value Date   PREALBUMIN 9.6 (L) 10/04/2018   CBC EXTENDED Latest Ref Rng & Units 01/27/2020 10/10/2018 10/08/2018  WBC 4.0 - 10.5 K/uL 5.8 9.5 5.2  RBC 4.22 - 5.81 MIL/uL 4.70 3.74(L) 4.22  HGB 13.0 - 17.0 g/dL 47.0 11.2(L) 12.5(L)  HCT 39 - 52 % 41.4 32.7(L) 36.7(L)  PLT 150 - 400 K/uL 343 305 272  NEUTROABS 1.7 - 7.7 K/uL - - -  LYMPHSABS 0.7 - 4.0 K/uL - - -     Body mass index is 23.33 kg/m.  Orders:  No orders of the defined types were placed in this encounter.  No orders of the defined types were placed in this encounter.    Procedures: No procedures performed  Clinical Data: No additional findings.  ROS:  All other systems negative, except as noted in the HPI. Review of Systems  Objective: Vital Signs: Ht 5\' 9"  (1.753 m)   Wt 158 lb (71.7 kg)   BMI 23.33 kg/m   Specialty Comments:  No specialty comments available.  PMFS History: Patient Active Problem List   Diagnosis Date Noted  . Osteomyelitis of great toe of right foot (HCC)   . History of amputation of lesser toe of left foot (HCC) 10/16/2018  . Diabetic infection of  left foot (HCC) 10/04/2018  . Diabetic infection of right foot (HCC) 10/04/2018  . Diabetic polyneuropathy associated with type 2 diabetes mellitus (HCC)    Past Medical History:  Diagnosis Date  . Diabetes mellitus without complication (HCC)    Type II  . Infection 10/04/2018   LEFT FOOT  . Osteomyelitis of great toe of left foot (HCC) 07/29/2018  . Subacute osteomyelitis, left ankle and foot (HCC)     Family History  Problem Relation Age of Onset  . Diabetes Mother     Past Surgical History:  Procedure Laterality Date  . AMPUTATION Left 07/31/2018   Procedure: LEFT GREAT TOE AMPUTATION;  Surgeon: [name], MD;  Location: [facility] OR;  Service: Orthopedics;  Laterality: Left;  . AMPUTATION Left 10/09/2018   Procedure: LEFT FOOT 2ND TOE AND POSSIBLE 3RD TOE AMPUTATION;  Surgeon: [name], MD;  Location: [facility] OR;  Service: Orthopedics;  Laterality: Left;  . AMPUTATION Right 03/17/2020   Procedure: RIGHT GREAT TOE AMPUTATION THROUGH METATARSAL;  Surgeon: [name], MD;  Location: [facility] OR;  Service: Orthopedics;  Laterality: Right;  . APPLICATION OF WOUND VAC Right 03/17/2020   Procedure: APPLICATION OF WOUND VAC;  Surgeon: [name], MD;  Location: [facility] OR;  Service: Orthopedics;  Laterality: Right;  . I & D EXTREMITY Right 01/30/2020   Procedure: DEBRIDEMENT ABSCESS RIGHT FOOT;  Surgeon: [name], MD;  Location: [facility] OR;  Service: Orthopedics;  Laterality: Right;   Social History   Occupational History  . Not on file  Tobacco Use  . Smoking status: Former Smoker    Years: 15.00  . Smokeless tobacco: Never Used  . Tobacco comment: 03/16/20- quit 12- 15 years ago  Vaping Use  . Vaping Use: Never used  Substance and Sexual Activity  . Alcohol use: Yes    Comment: sometimes  . Drug use: Never  . Sexual activity: Not on file

## 2020-04-06 ENCOUNTER — Other Ambulatory Visit (HOSPITAL_COMMUNITY)
Admission: RE | Admit: 2020-04-06 | Discharge: 2020-04-06 | Disposition: A | Discharge status: Home / Self Care | Payer: Self-pay | Source: Ambulatory Visit | Attending: Orthopedic Surgery | Admitting: Orthopedic Surgery

## 2020-04-06 ENCOUNTER — Other Ambulatory Visit: Payer: Self-pay | Admitting: Physician Assistant

## 2020-04-06 ENCOUNTER — Ambulatory Visit (INDEPENDENT_AMBULATORY_CARE_PROVIDER_SITE_OTHER): Payer: Self-pay | Admitting: Family

## 2020-04-06 ENCOUNTER — Encounter: Payer: Self-pay | Admitting: Family

## 2020-04-06 VITALS — Wt 158.0 lb

## 2020-04-06 DIAGNOSIS — Z01812 Encounter for preprocedural laboratory examination: Secondary | ICD-10-CM | POA: Insufficient documentation

## 2020-04-06 DIAGNOSIS — M869 Osteomyelitis, unspecified: Secondary | ICD-10-CM

## 2020-04-06 DIAGNOSIS — Z20822 Contact with and (suspected) exposure to covid-19: Secondary | ICD-10-CM | POA: Insufficient documentation

## 2020-04-06 DIAGNOSIS — T8781 Dehiscence of amputation stump: Secondary | ICD-10-CM

## 2020-04-06 LAB — SARS CORONAVIRUS 2 (TAT 6-24 HRS): SARS Coronavirus 2: NEGATIVE

## 2020-04-06 NOTE — Progress Notes (Addendum)
I spoke to Mr. Jon House [address] [place], [place] # [id number]. Patient denies chest pain or shortness of breath.  Jon House- Jon House has type II diabetes. Patient checked CBG , result 136, Patient said he will not take any Insulin for that unless I eat, if I eat I would take 10 units.  I instructed patient to take take 7 units tonight,  70 % of scheduled Insulin. Do not take any Insulin in am.  Patient repeated instructions. I instructed patient to check CBG after awaking and every 2 hours until arrival  to the hospital.  I Instructed patient if CBG is less than 70 to drink  1/2 cup of a clear juice. Recheck CBG in 15 minutes then call pre- op desk at [phone] for further instructions.

## 2020-04-06 NOTE — Progress Notes (Signed)
   Post-Op Visit Note   Patient: Jon House           Date of Birth: Jan 30, 1971           MRN: [id number] Visit Date: 04/06/2020 PCP: Patient, No Pcp Per  Chief Complaint:  Chief Complaint  Patient presents with  . Right Foot - Routine Post Op    03/17/20 right GT amputation     HPI:  HPI Patient is a 49 year old gentleman seen status post right first ray amputation.    Ortho Exam Incision approximated with sutures. Proximally an area has dehisced there is opaque white drainage. No odor. Mild surrounding erythema.  Unfortunately has exposed bone in ulcer.  Visit Diagnoses: No diagnosis found.  Plan: will proceed with excision of cuniform and revision surgery. Dr. Lajoyce Corners to bedside. Patient and dr duda in agreement with plan. Patient would like to proceed asap.   Cheryl to post for tomorrow.  Follow-Up Instructions: Return postop.   Imaging: No results found.  Orders:  No orders of the defined types were placed in this encounter.  No orders of the defined types were placed in this encounter.    PMFS History: Patient Active Problem List   Diagnosis Date Noted  . Osteomyelitis of great toe of right foot (HCC)   . History of amputation of lesser toe of left foot (HCC) 10/16/2018  . Diabetic infection of left foot (HCC) 10/04/2018  . Diabetic infection of right foot (HCC) 10/04/2018  . Diabetic polyneuropathy associated with type 2 diabetes mellitus (HCC)    Past Medical History:  Diagnosis Date  . Diabetes mellitus without complication (HCC)    Type II  . Infection 10/04/2018   LEFT FOOT  . Osteomyelitis of great toe of left foot (HCC) 07/29/2018  . Subacute osteomyelitis, left ankle and foot (HCC)     Family History  Problem Relation Age of Onset  . Diabetes Mother     Past Surgical History:  Procedure Laterality Date  . AMPUTATION Left 07/31/2018   Procedure: LEFT GREAT TOE AMPUTATION;  Surgeon: [name], MD;  Location: [facility] OR;  Service:  Orthopedics;  Laterality: Left;  . AMPUTATION Left 10/09/2018   Procedure: LEFT FOOT 2ND TOE AND POSSIBLE 3RD TOE AMPUTATION;  Surgeon: [name], MD;  Location: [facility] OR;  Service: Orthopedics;  Laterality: Left;  . AMPUTATION Right 03/17/2020   Procedure: RIGHT GREAT TOE AMPUTATION THROUGH METATARSAL;  Surgeon: [name], MD;  Location: [facility] OR;  Service: Orthopedics;  Laterality: Right;  . APPLICATION OF WOUND VAC Right 03/17/2020   Procedure: APPLICATION OF WOUND VAC;  Surgeon: [name], MD;  Location: [facility] OR;  Service: Orthopedics;  Laterality: Right;  . I & D EXTREMITY Right 01/30/2020   Procedure: DEBRIDEMENT ABSCESS RIGHT FOOT;  Surgeon: [name], MD;  Location: [facility] OR;  Service: Orthopedics;  Laterality: Right;   Social History   Occupational History  . Not on file  Tobacco Use  . Smoking status: Former Smoker    Years: 15.00  . Smokeless tobacco: Never Used  . Tobacco comment: 03/16/20- quit 12- 15 years ago  Vaping Use  . Vaping Use: Never used  Substance and Sexual Activity  . Alcohol use: Yes    Comment: sometimes  . Drug use: Never  . Sexual activity: Not on file

## 2020-04-07 ENCOUNTER — Encounter (HOSPITAL_COMMUNITY): Payer: Self-pay | Admitting: Orthopedic Surgery

## 2020-04-07 ENCOUNTER — Encounter (HOSPITAL_COMMUNITY)
Admission: RE | Disposition: A | Discharge status: Home / Self Care | Payer: Self-pay | Source: Home / Self Care | Attending: Orthopedic Surgery

## 2020-04-07 ENCOUNTER — Ambulatory Visit (HOSPITAL_COMMUNITY): Payer: Self-pay | Admitting: Anesthesiology

## 2020-04-07 ENCOUNTER — Other Ambulatory Visit: Payer: Self-pay

## 2020-04-07 ENCOUNTER — Ambulatory Visit (HOSPITAL_COMMUNITY)
Admission: RE | Admit: 2020-04-07 | Discharge: 2020-04-07 | Disposition: A | Discharge status: Home / Self Care | Payer: Self-pay | Attending: Orthopedic Surgery | Admitting: Orthopedic Surgery

## 2020-04-07 DIAGNOSIS — Z89422 Acquired absence of other left toe(s): Secondary | ICD-10-CM | POA: Insufficient documentation

## 2020-04-07 DIAGNOSIS — Z87891 Personal history of nicotine dependence: Secondary | ICD-10-CM | POA: Insufficient documentation

## 2020-04-07 DIAGNOSIS — Z89412 Acquired absence of left great toe: Secondary | ICD-10-CM | POA: Insufficient documentation

## 2020-04-07 DIAGNOSIS — M869 Osteomyelitis, unspecified: Secondary | ICD-10-CM | POA: Insufficient documentation

## 2020-04-07 DIAGNOSIS — M86271 Subacute osteomyelitis, right ankle and foot: Secondary | ICD-10-CM

## 2020-04-07 HISTORY — PX: I & D EXTREMITY: SHX5045

## 2020-04-07 LAB — BASIC METABOLIC PANEL
Anion gap: 12 (ref 5–15)
BUN: 13 mg/dL (ref 6–20)
CO2: 26 mmol/L (ref 22–32)
Calcium: 9 mg/dL (ref 8.9–10.3)
Chloride: 99 mmol/L (ref 98–111)
Creatinine, Ser: 0.74 mg/dL (ref 0.61–1.24)
GFR, Estimated: >60 mL/min (ref >60)
Glucose, Bld: 146 mg/dL — ABNORMAL HIGH (ref 70–99)
Potassium: 3.9 mmol/L (ref 3.5–5.1)
Sodium: 137 mmol/L (ref 135–145)

## 2020-04-07 LAB — GLUCOSE, CAPILLARY
Glucose-Capillary: 149 mg/dL — ABNORMAL HIGH (ref 70–99)
Glucose-Capillary: 163 mg/dL — ABNORMAL HIGH (ref 70–99)

## 2020-04-07 SURGERY — IRRIGATION AND DEBRIDEMENT EXTREMITY
Anesthesia: General | Laterality: Right

## 2020-04-07 MED ORDER — MIDAZOLAM HCL 2 MG/2ML IJ SOLN
INTRAMUSCULAR | Status: AC
  Filled 2020-04-07: qty 2

## 2020-04-07 MED ORDER — LACTATED RINGERS IV SOLN: INTRAVENOUS | Status: DC

## 2020-04-07 MED ORDER — PROPOFOL 10 MG/ML IV BOLUS
INTRAVENOUS | Status: DC | PRN
  Administered 2020-04-07: 150 mg via INTRAVENOUS

## 2020-04-07 MED ORDER — PHENYLEPHRINE 40 MCG/ML (10ML) SYRINGE FOR IV PUSH (FOR BLOOD PRESSURE SUPPORT)
PREFILLED_SYRINGE | INTRAVENOUS | Status: AC
  Filled 2020-04-07: qty 10

## 2020-04-07 MED ORDER — CEFAZOLIN SODIUM-DEXTROSE 2-4 GM/100ML-% IV SOLN
2.0000 g | INTRAVENOUS | Status: AC
  Administered 2020-04-07: 2 g via INTRAVENOUS

## 2020-04-07 MED ORDER — MIDAZOLAM HCL 5 MG/5ML IJ SOLN
INTRAMUSCULAR | Status: DC | PRN
  Administered 2020-04-07: 2 mg via INTRAVENOUS

## 2020-04-07 MED ORDER — LIDOCAINE HCL (CARDIAC) PF 100 MG/5ML IV SOSY
PREFILLED_SYRINGE | INTRAVENOUS | Status: DC | PRN
  Administered 2020-04-07: 60 mg via INTRAVENOUS

## 2020-04-07 MED ORDER — ORAL CARE MOUTH RINSE: 15.0000 mL | Freq: Once | OROMUCOSAL | Status: AC

## 2020-04-07 MED ORDER — FENTANYL CITRATE (PF) 250 MCG/5ML IJ SOLN
INTRAMUSCULAR | Status: AC
  Filled 2020-04-07: qty 5

## 2020-04-07 MED ORDER — CHLORHEXIDINE GLUCONATE 0.12 % MT SOLN
15.0000 mL | Freq: Once | OROMUCOSAL | Status: AC
  Administered 2020-04-07: 15 mL via OROMUCOSAL
  Filled 2020-04-07: qty 15

## 2020-04-07 MED ORDER — EPHEDRINE SULFATE 50 MG/ML IJ SOLN
INTRAMUSCULAR | Status: DC | PRN
  Administered 2020-04-07: 10 mg via INTRAVENOUS

## 2020-04-07 MED ORDER — OXYCODONE-ACETAMINOPHEN 5-325 MG PO TABS: 1.0000 | ORAL_TABLET | ORAL | 0 refills | Status: DC | PRN

## 2020-04-07 MED ORDER — EPHEDRINE 5 MG/ML INJ
INTRAVENOUS | Status: AC
  Filled 2020-04-07: qty 10

## 2020-04-07 MED ORDER — DEXAMETHASONE SODIUM PHOSPHATE 10 MG/ML IJ SOLN
INTRAMUSCULAR | Status: DC | PRN
  Administered 2020-04-07: 5 mg via INTRAVENOUS

## 2020-04-07 MED ORDER — ONDANSETRON HCL 4 MG/2ML IJ SOLN
INTRAMUSCULAR | Status: DC | PRN
  Administered 2020-04-07: 4 mg via INTRAVENOUS

## 2020-04-07 MED ORDER — FENTANYL CITRATE (PF) 100 MCG/2ML IJ SOLN
INTRAMUSCULAR | Status: AC
  Filled 2020-04-07: qty 2

## 2020-04-07 MED ORDER — PHENYLEPHRINE HCL (PRESSORS) 10 MG/ML IV SOLN
INTRAVENOUS | Status: DC | PRN
  Administered 2020-04-07: 80 ug via INTRAVENOUS

## 2020-04-07 MED ORDER — CEFAZOLIN SODIUM-DEXTROSE 2-4 GM/100ML-% IV SOLN
INTRAVENOUS | Status: AC
  Filled 2020-04-07: qty 100

## 2020-04-07 MED ORDER — FENTANYL CITRATE (PF) 250 MCG/5ML IJ SOLN
INTRAMUSCULAR | Status: DC | PRN
  Administered 2020-04-07: 50 ug via INTRAVENOUS

## 2020-04-07 MED ORDER — 0.9 % SODIUM CHLORIDE (POUR BTL) OPTIME
TOPICAL | Status: DC | PRN
  Administered 2020-04-07: 1000 mL

## 2020-04-07 SURGICAL SUPPLY — 37 items
BLADE SURG 21 STRL SS (BLADE) ×3 IMPLANT
BNDG COHESIVE 4X5 TAN STRL (GAUZE/BANDAGES/DRESSINGS) ×3 IMPLANT
BNDG COHESIVE 6X5 TAN STRL LF (GAUZE/BANDAGES/DRESSINGS) IMPLANT
BNDG GAUZE ELAST 4 BULKY (GAUZE/BANDAGES/DRESSINGS) ×6 IMPLANT
COVER SURGICAL LIGHT HANDLE (MISCELLANEOUS) ×6 IMPLANT
COVER WAND RF STERILE (DRAPES) IMPLANT
DRAPE DERMATAC (DRAPES) ×3 IMPLANT
DRAPE U-SHAPE 47X51 STRL (DRAPES) ×3 IMPLANT
DRESSING PEEL AND PLC PRVNA 13 (GAUZE/BANDAGES/DRESSINGS) ×1 IMPLANT
DRSG ADAPTIC 3X8 NADH LF (GAUZE/BANDAGES/DRESSINGS) ×3 IMPLANT
DRSG PEEL AND PLACE PREVENA 13 (GAUZE/BANDAGES/DRESSINGS) ×3
DURAPREP 26ML APPLICATOR (WOUND CARE) ×3 IMPLANT
ELECT REM PT RETURN 9FT ADLT (ELECTROSURGICAL)
ELECTRODE REM PT RTRN 9FT ADLT (ELECTROSURGICAL) IMPLANT
GAUZE SPONGE 4X4 12PLY STRL (GAUZE/BANDAGES/DRESSINGS) ×3 IMPLANT
GLOVE BIOGEL PI IND STRL 9 (GLOVE) ×1 IMPLANT
GLOVE BIOGEL PI INDICATOR 9 (GLOVE) ×2
GLOVE SURG ORTHO 9.0 STRL STRW (GLOVE) ×3 IMPLANT
GOWN STRL REUS W/ TWL XL LVL3 (GOWN DISPOSABLE) ×2 IMPLANT
GOWN STRL REUS W/TWL XL LVL3 (GOWN DISPOSABLE) ×6
HANDPIECE INTERPULSE COAX TIP (DISPOSABLE)
KIT BASIN OR (CUSTOM PROCEDURE TRAY) ×3 IMPLANT
KIT PREVENA INCISION MGT 13 (CANNISTER) ×3 IMPLANT
KIT TURNOVER KIT B (KITS) ×3 IMPLANT
MANIFOLD NEPTUNE II (INSTRUMENTS) ×3 IMPLANT
NS IRRIG 1000ML POUR BTL (IV SOLUTION) ×3 IMPLANT
PACK ORTHO EXTREMITY (CUSTOM PROCEDURE TRAY) ×3 IMPLANT
PAD ARMBOARD 7.5X6 YLW CONV (MISCELLANEOUS) ×6 IMPLANT
SET HNDPC FAN SPRY TIP SCT (DISPOSABLE) IMPLANT
STOCKINETTE IMPERVIOUS 9X36 MD (GAUZE/BANDAGES/DRESSINGS) IMPLANT
SUT ETHILON 2 0 PSLX (SUTURE) ×6 IMPLANT
SWAB COLLECTION DEVICE MRSA (MISCELLANEOUS) ×3 IMPLANT
SWAB CULTURE ESWAB REG 1ML (MISCELLANEOUS) IMPLANT
TOWEL GREEN STERILE (TOWEL DISPOSABLE) ×3 IMPLANT
TUBE CONNECTING 12'X1/4 (SUCTIONS) ×1
TUBE CONNECTING 12X1/4 (SUCTIONS) ×2 IMPLANT
YANKAUER SUCT BULB TIP NO VENT (SUCTIONS) ×3 IMPLANT

## 2020-04-07 NOTE — Progress Notes (Signed)
Orthopedic Tech Progress Note Patient Details:  Jiovany Scheffel [birth year] [phone] PACU RN called requesting a POST OP SHOE for patient. Ortho Devices Type of Ortho Device: Postop shoe/boot Ortho Device/Splint Location: RLE Ortho Device/Splint Interventions: Application   Post Interventions Patient Tolerated: Well Instructions Provided: Care of device   Donald Pore 04/07/2020, 11:37 AM

## 2020-04-07 NOTE — H&P (Signed)
Jon House is an 49 y.o. male.   Chief Complaint: Right foot osteomyelitias HPI: HPI Patient is a 49 year old gentleman seen status post right first ray amputation.   Past Medical History:  Diagnosis Date  . Diabetes mellitus without complication (HCC)    Type II  . Infection 10/04/2018   LEFT FOOT  . Osteomyelitis of great toe of left foot (HCC) 07/29/2018  . Subacute osteomyelitis, left ankle and foot [facility])     Past Surgical History:  Procedure Laterality Date  . AMPUTATION Left 07/31/2018   Procedure: LEFT GREAT TOE AMPUTATION;  Surgeon: [name], MD;  Location: [facility] OR;  Service: Orthopedics;  Laterality: Left;  . AMPUTATION Left 10/09/2018   Procedure: LEFT FOOT 2ND TOE AND POSSIBLE 3RD TOE AMPUTATION;  Surgeon: [name], MD;  Location: [facility] OR;  Service: Orthopedics;  Laterality: Left;  . AMPUTATION Right 03/17/2020   Procedure: RIGHT GREAT TOE AMPUTATION THROUGH METATARSAL;  Surgeon: [name], MD;  Location: [facility] OR;  Service: Orthopedics;  Laterality: Right;  . APPLICATION OF WOUND VAC Right 03/17/2020   Procedure: APPLICATION OF WOUND VAC;  Surgeon: [name], MD;  Location: [facility] OR;  Service: Orthopedics;  Laterality: Right;  . I & D EXTREMITY Right 01/30/2020   Procedure: DEBRIDEMENT ABSCESS RIGHT FOOT;  Surgeon: [name], MD;  Location: [facility] OR;  Service: Orthopedics;  Laterality: Right;    Family History  Problem Relation Age of Onset  . Diabetes Mother    Social History:  reports that he has quit smoking. He quit after 15.00 years of use. He has never used smokeless tobacco. He reports current alcohol use. He reports that he does not use drugs.  Allergies:  Allergies  Allergen Reactions  . Bee Venom Hives  . Metformin And Related Diarrhea    No medications prior to admission.    Results for orders placed or performed during the hospital encounter of 04/06/20 (from the past 48 hour(s))  SARS CORONAVIRUS 2 (TAT 6-24 HRS) Nasopharyngeal  Nasopharyngeal Swab     Status: None   Collection Time: 04/06/20 11:50 AM   Specimen: Nasopharyngeal Swab  Result Value Ref Range   SARS Coronavirus 2 NEGATIVE NEGATIVE    Comment: (NOTE) SARS-CoV-2 target nucleic acids are NOT DETECTED.  The SARS-CoV-2 RNA is generally detectable in upper and lower respiratory specimens during the acute phase of infection. Negative results do not preclude SARS-CoV-2 infection, do not rule out co-infections with other pathogens, and should not be used as the sole basis for treatment or other patient management decisions. Negative results must be combined with clinical observations, patient history, and epidemiological information. The expected result is Negative.  Fact Sheet for Patients: HairSlick.no  Fact Sheet for Healthcare Providers: quierodirigir.com  This test is not yet approved or cleared by the [place] FDA and  has been authorized for detection and/or diagnosis of SARS-CoV-2 by FDA under an Emergency Use Authorization (EUA). This EUA will remain  in effect (meaning this test can be used) for the duration of the COVID-19 declaration under Se ction 564(b)(1) of the Act, 21 U.S.C. section 360bbb-3(b)(1), unless the authorization is terminated or revoked sooner.  Performed at [facility], [address], [place], [place] [address]    No results found.  Review of Systems  All other systems reviewed and are negative.   There were no vitals taken for this visit. Physical Exam  Incision approximated with sutures. Proximally an area has  dehisced there is opaque white drainage. No odor. Mild surrounding erythema.  Unfortunately has exposed bone in ulcer.Heart RRR Lungs Clear Assessment/Plan will proceed with excision of cuniform and revision surgery. Dr. Lajoyce Corners to bedside. Patient and dr duda in agreement with plan. Patient would like to proceed asap.   [place] Jon Craighead,  PA 04/07/2020, 6:53 AM

## 2020-04-07 NOTE — Op Note (Signed)
04/07/2020  10:16 AM  PATIENT:  Jon House    PRE-OPERATIVE DIAGNOSIS:  Infected Bone right foot  POST-OPERATIVE DIAGNOSIS:  Same  PROCEDURE:  PARTIAL TARSAL EXCISION RIGHT FOOT Local tissue rearrangement for wound closure 10 x 4 cm. Application of Prevena wound VAC 13 cm  SURGEON:  [name], MD  PHYSICIAN ASSISTANT:None ANESTHESIA:   General  PREOPERATIVE INDICATIONS:  [name] is a  49 y.o. male with a diagnosis of Infected Bone who failed conservative measures and elected for surgical management.    The risks benefits and alternatives were discussed with the patient preoperatively including but not limited to the risks of infection, bleeding, nerve injury, cardiopulmonary complications, the need for revision surgery, among others, and the patient was willing to proceed.  OPERATIVE IMPLANTS: Praveena wound VAC 13 cm  @ENCIMAGES @  OPERATIVE FINDINGS: Patient had exposed necrotic medial cuneiform bone that was excised.  There was no deep abscess no soft tissue infection  OPERATIVE PROCEDURE: Patient was brought the operating room and underwent a general anesthetic.  After adequate level of anesthesia were obtained patient's right lower extremity was prepped using DuraPrep draped into a sterile field a timeout was called.  Elliptical incision was made around the ulcerative tissue this was carried sharply down to bone the incision left a wound that was 10 x 4 cm.  The medial cuneiform was excised electrocautery was used hemostasis the wound was irrigated with normal saline.  Local tissue rearrangement was used to close the wound 10 x 4 cm with 2-0 nylon.  A 13 cm Prevena wound VAC was applied this had a good suction fit patient was extubated taken to PACU in stable dilation   DISCHARGE PLANNING:  Antibiotic duration: Preoperative antibiotics  Weightbearing: Nonweightbearing on the right  Pain medication: Patient has pain medication at home  Dressing  care/ Wound VAC: Continue wound VAC for 1 week  Ambulatory devices: Crutches  Discharge to: Home.  Follow-up: In the office 1 week post operative.

## 2020-04-07 NOTE — Anesthesia Procedure Notes (Signed)
Procedure Name: LMA Insertion Date/Time: 04/07/2020 9:44 AM Performed by: Marena Chancy, CRNA Pre-anesthesia Checklist: Patient identified, Emergency Drugs available, Suction available and Patient being monitored Patient Re-evaluated:Patient Re-evaluated prior to induction Oxygen Delivery Method: Circle System Utilized Preoxygenation: Pre-oxygenation with 100% oxygen Induction Type: IV induction Ventilation: Mask ventilation without difficulty LMA: LMA inserted LMA Size: 4.0 Number of attempts: 1 Airway Equipment and Method: Bite block Placement Confirmation: positive ETCO2 Tube secured with: Tape Dental Injury: Teeth and Oropharynx as per pre-operative assessment

## 2020-04-07 NOTE — Anesthesia Postprocedure Evaluation (Signed)
Anesthesia Post Note  Patient: Jon House  Procedure(s) Performed: PARTIAL TARSAL EXCISION RIGHT FOOT (Right )     Patient location during evaluation: PACU Anesthesia Type: General Level of consciousness: awake and alert Pain management: pain level controlled Vital Signs Assessment: post-procedure vital signs reviewed and stable Respiratory status: spontaneous breathing, nonlabored ventilation and respiratory function stable Cardiovascular status: blood pressure returned to baseline and stable Postop Assessment: no apparent nausea or vomiting Anesthetic complications: no   No complications documented.  Last Vitals:  Vitals:   04/07/20 1030 04/07/20 1045  BP: 115/76 136/86  Pulse: 92 94  Resp: 12 15  Temp:  36.6 C  SpO2: 99% 100%    Last Pain:  Vitals:   04/07/20 1045  TempSrc:   PainSc: 0-No pain                 Mellody Dance

## 2020-04-07 NOTE — Anesthesia Preprocedure Evaluation (Signed)
Anesthesia Evaluation  Patient identified by MRN, date of birth, ID band Patient awake    Reviewed: Allergy & Precautions, NPO status , Patient's Chart, lab work & pertinent test results  Airway Mallampati: II  TM Distance: >3 FB Neck ROM: Full    Dental no notable dental hx.    Pulmonary former smoker,    breath sounds clear to auscultation       Cardiovascular negative cardio ROS   Rhythm:Regular Rate:Normal     Neuro/Psych  Neuromuscular disease negative psych ROS   GI/Hepatic negative GI ROS, Neg liver ROS,   Endo/Other  diabetes  Renal/GU   negative genitourinary   Musculoskeletal negative musculoskeletal ROS (+)   Abdominal Normal abdominal exam  (+)   Peds negative pediatric ROS (+)  Hematology negative hematology ROS (+)   Anesthesia Other Findings   Reproductive/Obstetrics negative OB ROS                             Anesthesia Physical  Anesthesia Plan  ASA: III  Anesthesia Plan: General   Post-op Pain Management:    Induction: Intravenous  PONV Risk Score and Plan: 2 and Ondansetron, Dexamethasone and Midazolam  Airway Management Planned: LMA  Additional Equipment:   Intra-op Plan:   Post-operative Plan: Extubation in OR  Informed Consent: I have reviewed the patients History and Physical, chart, labs and discussed the procedure including the risks, benefits and alternatives for the proposed anesthesia with the patient or authorized representative who has indicated his/her understanding and acceptance.     Dental advisory given  Plan Discussed with: CRNA and Anesthesiologist  Anesthesia Plan Comments:         Anesthesia Quick Evaluation

## 2020-04-07 NOTE — Transfer of Care (Signed)
Immediate Anesthesia Transfer of Care Note  Patient: Jon House  Procedure(s) Performed: PARTIAL TARSAL EXCISION RIGHT FOOT (Right )  Patient Location: PACU  Anesthesia Type:General  Level of Consciousness: awake, alert  and oriented  Airway & Oxygen Therapy: Patient Spontanous Breathing and Patient connected to nasal cannula oxygen  Post-op Assessment: Report given to RN and Post -op Vital signs reviewed and stable  Post vital signs: Reviewed and stable  Last Vitals:  Vitals Value Taken Time  BP 118/68 04/07/20 1015  Temp 36.4 C 04/07/20 1015  Pulse 87 04/07/20 1016  Resp 14 04/07/20 1016  SpO2 100 % 04/07/20 1016  Vitals shown include unvalidated device data.  Last Pain:  Vitals:   04/07/20 0842  TempSrc:   PainSc: 0-No pain         Complications: No complications documented.

## 2020-04-08 ENCOUNTER — Encounter (HOSPITAL_COMMUNITY): Payer: Self-pay | Admitting: Orthopedic Surgery

## 2020-04-14 ENCOUNTER — Encounter: Payer: Self-pay | Admitting: Physician Assistant

## 2020-04-14 ENCOUNTER — Ambulatory Visit (INDEPENDENT_AMBULATORY_CARE_PROVIDER_SITE_OTHER): Payer: Self-pay | Admitting: Physician Assistant

## 2020-04-14 VITALS — Ht 69.0 in | Wt 158.0 lb

## 2020-04-14 DIAGNOSIS — M86271 Subacute osteomyelitis, right ankle and foot: Secondary | ICD-10-CM

## 2020-04-14 NOTE — Progress Notes (Signed)
Office Visit Note   Patient: Jon House           Date of Birth: 04-08-1971           MRN: [id number] Visit Date: 04/14/2020              Requested by: No referring provider defined for this encounter. PCP: Patient, No Pcp Per  Chief Complaint  Patient presents with  . Right Foot - Routine Post Op    04/07/20 partial tarsal excision       HPI: Patient presents today 1 week status post partial tarsal excision on the right foot.  He has been nonweightbearing  Assessment & Plan: Visit Diagnoses: No diagnosis found.  Plan: Should begin daily Dial cleansing.  Continue to elevate foot and remain nonweightbearing.  Follow-up in 10 days  Follow-Up Instructions: No follow-ups on file.   Ortho Exam  Patient is alert, oriented, no adenopathy, well-dressed, normal affect, normal respiratory effort. Focused examination some mild to moderate soft tissue swelling well apposed wound edges he does have some maceration distally on the wound.  No foul odor.  Bloody drainage only.  No cellulitis or signs of acute infection  Imaging: No results found. No images are attached to the encounter.  Labs: Lab Results  Component Value Date   HGBA1C 10.2 (A) 07/07/2019   HGBA1C 9.6 (A) 10/02/2018   HGBA1C 11.1 (H) 07/29/2018   ESRSEDRATE 3 07/07/2019   ESRSEDRATE 69 (H) 10/06/2018   ESRSEDRATE 74 (H) 10/05/2018   CRP 13.7 (H) 10/06/2018   CRP 19.7 (H) 10/05/2018   CRP 19.7 (H) 10/04/2018   REPTSTATUS 02/04/2020 FINAL 01/30/2020   GRAMSTAIN  01/30/2020    RARE WBC PRESENT, PREDOMINANTLY PMN RARE GRAM POSITIVE COCCI    CULT  01/30/2020    FEW ENTEROBACTER CLOACAE FEW ENTEROCOCCUS FAECALIS NO ANAEROBES ISOLATED Performed at [facility], [address], [place], [place] [address]    LABORGA ENTEROBACTER CLOACAE 01/30/2020   LABORGA ENTEROCOCCUS FAECALIS 01/30/2020     Lab Results  Component Value Date   ALBUMIN 3.9 10/03/2018   ALBUMIN 4.7 08/05/2018   ALBUMIN 3.9  07/29/2018   PREALBUMIN 9.6 (L) 10/04/2018    No results found for: MG No results found for: VD25OH  Lab Results  Component Value Date   PREALBUMIN 9.6 (L) 10/04/2018   CBC EXTENDED Latest Ref Rng & Units 01/27/2020 10/10/2018 10/08/2018  WBC 4.0 - 10.5 K/uL 5.8 9.5 5.2  RBC 4.22 - 5.81 MIL/uL 4.70 3.74(L) 4.22  HGB 13.0 - 17.0 g/dL 26.9 11.2(L) 12.5(L)  HCT 39.0 - 52.0 % 41.4 32.7(L) 36.7(L)  PLT 150 - 400 K/uL 343 305 272  NEUTROABS 1.7 - 7.7 K/uL - - -  LYMPHSABS 0.7 - 4.0 K/uL - - -     Body mass index is 23.33 kg/m.  Orders:  No orders of the defined types were placed in this encounter.  No orders of the defined types were placed in this encounter.    Procedures: No procedures performed  Clinical Data: No additional findings.  ROS:  All other systems negative, except as noted in the HPI. Review of Systems  Objective: Vital Signs: Ht 5\' 9"  (1.753 m)   Wt 158 lb (71.7 kg)   BMI 23.33 kg/m   Specialty Comments:  No specialty comments available.  PMFS History: Patient Active Problem List   Diagnosis Date Noted  . Subacute osteomyelitis, right ankle and foot (HCC)   . Osteomyelitis of great toe  of right foot (HCC)   . History of amputation of lesser toe of left foot (HCC) 10/16/2018  . Diabetic infection of left foot (HCC) 10/04/2018  . Diabetic infection of right foot (HCC) 10/04/2018  . Diabetic polyneuropathy associated with type 2 diabetes mellitus (HCC)    Past Medical History:  Diagnosis Date  . Diabetes mellitus without complication (HCC)    Type II  . Infection 10/04/2018   LEFT FOOT  . Osteomyelitis of great toe of left foot (HCC) 07/29/2018  . Subacute osteomyelitis, left ankle and foot (HCC)     Family History  Problem Relation Age of Onset  . Diabetes Mother     Past Surgical History:  Procedure Laterality Date  . AMPUTATION Left 07/31/2018   Procedure: LEFT GREAT TOE AMPUTATION;  Surgeon: [name], MD;  Location: [facility] OR;   Service: Orthopedics;  Laterality: Left;  . AMPUTATION Left 10/09/2018   Procedure: LEFT FOOT 2ND TOE AND POSSIBLE 3RD TOE AMPUTATION;  Surgeon: [name], MD;  Location: [facility] OR;  Service: Orthopedics;  Laterality: Left;  . AMPUTATION Right 03/17/2020   Procedure: RIGHT GREAT TOE AMPUTATION THROUGH METATARSAL;  Surgeon: [name], MD;  Location: [facility] OR;  Service: Orthopedics;  Laterality: Right;  . APPLICATION OF WOUND VAC Right 03/17/2020   Procedure: APPLICATION OF WOUND VAC;  Surgeon: [name], MD;  Location: [facility] OR;  Service: Orthopedics;  Laterality: Right;  . I & D EXTREMITY Right 01/30/2020   Procedure: DEBRIDEMENT ABSCESS RIGHT FOOT;  Surgeon: [name], MD;  Location: [facility] OR;  Service: Orthopedics;  Laterality: Right;  . I & D EXTREMITY Right 04/07/2020   Procedure: PARTIAL TARSAL EXCISION RIGHT FOOT;  Surgeon: [name], MD;  Location: [facility] OR;  Service: Orthopedics;  Laterality: Right;   Social History   Occupational History  . Not on file  Tobacco Use  . Smoking status: Former Smoker    Years: 15.00  . Smokeless tobacco: Never Used  . Tobacco comment: 03/16/20- quit 12- 15 years ago  Vaping Use  . Vaping Use: Never used  Substance and Sexual Activity  . Alcohol use: Yes    Comment: sometimes  . Drug use: Never  . Sexual activity: Not on file

## 2020-04-28 ENCOUNTER — Ambulatory Visit (INDEPENDENT_AMBULATORY_CARE_PROVIDER_SITE_OTHER): Payer: Self-pay | Admitting: Orthopedic Surgery

## 2020-04-28 ENCOUNTER — Encounter: Payer: Self-pay | Admitting: Physician Assistant

## 2020-04-28 VITALS — Ht 69.0 in | Wt 158.0 lb

## 2020-04-28 DIAGNOSIS — M86271 Subacute osteomyelitis, right ankle and foot: Secondary | ICD-10-CM

## 2020-04-28 IMAGING — MR MRI OF THE LEFT FOOT WITHOUT CONTRAST
5 series · 39 of 40 positions shown · non-contrast
Comparison: Left foot x-rays dated October 03, 2018.

CLINICAL DATA: Draining ulcer at the plantar base of the second
metatarsal head. Prior great toe amputation.

EXAM:
MRI OF THE LEFT FOOT WITHOUT CONTRAST
TECHNIQUE: Multiplanar, multisequence MR imaging of the left forefoot was
performed. No intravenous contrast was administered.

[Series 4: STIR · sagittal · left · 3.0mm · 0.55mm/px · 8 of 34 slices shown]
[im 1/34]
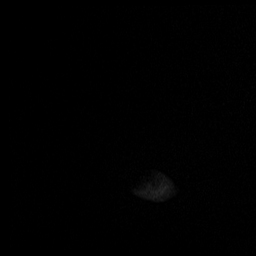
[im 5/34]
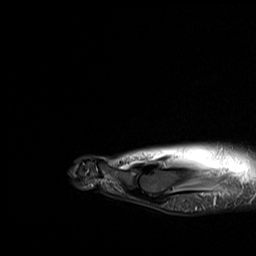
[im 10/34]
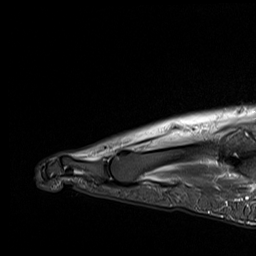
[im 15/34]
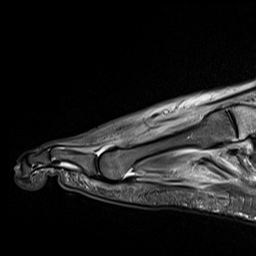
[im 19/34]
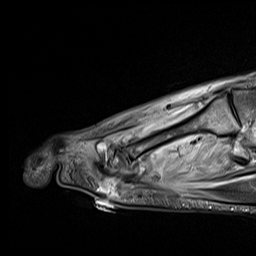
[im 24/34]
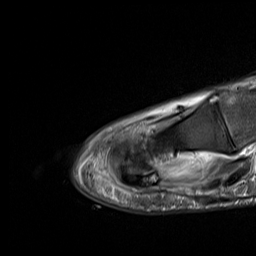
[im 29/34]
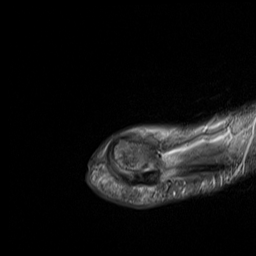
[im 34/34]
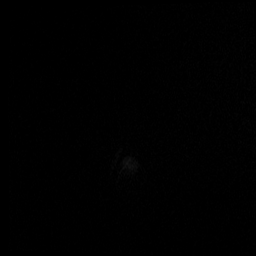

[Series 5: T1 · coronal · left · 3.0mm · 0.31mm/px · 9 of 44 slices shown (1 of 2)]
[im 1/44]
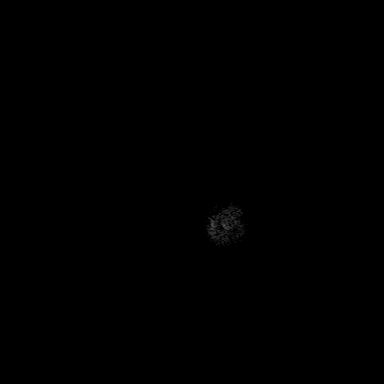
[im 5/44]
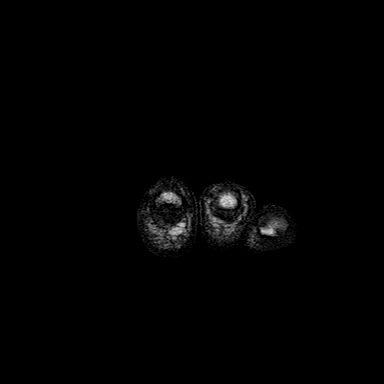
[im 10/44]
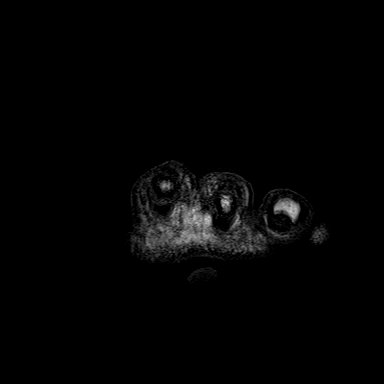
[im 15/44]
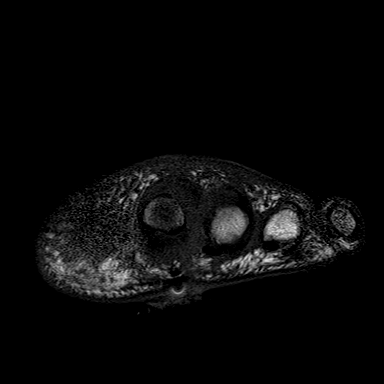
[im 20/44]
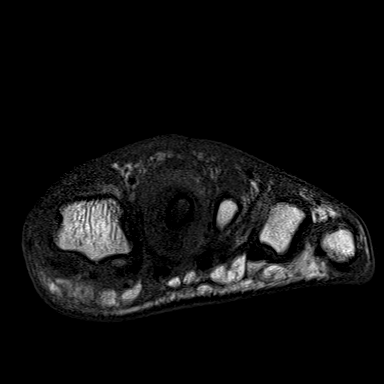
[im 24/44]
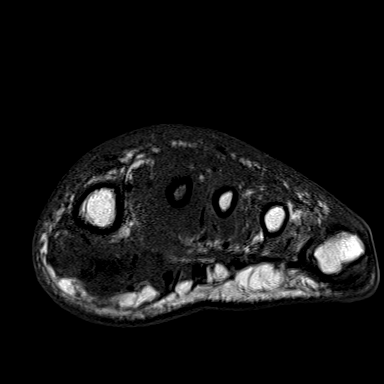
[im 29/44]
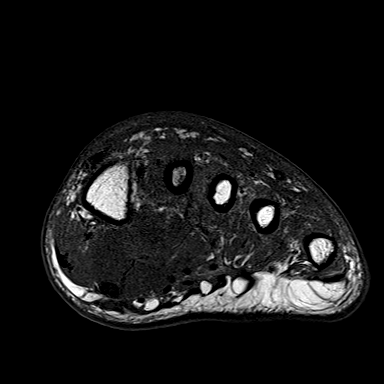
[im 39/44]
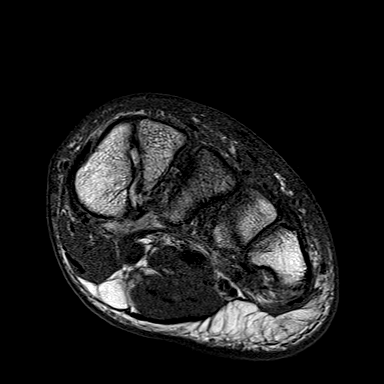
[im 44/44]
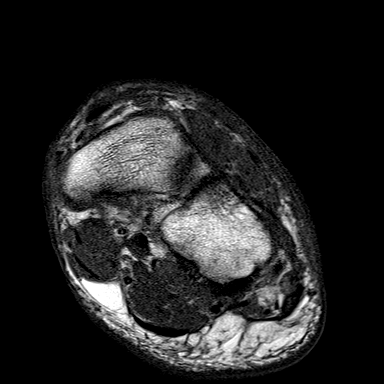

[Series 7: T1 · axial · left · 3.0mm · 0.36mm/px · z∈[-82,-5]mm · 6 of 24 slices shown (2 of 2)]
[im 1/24]
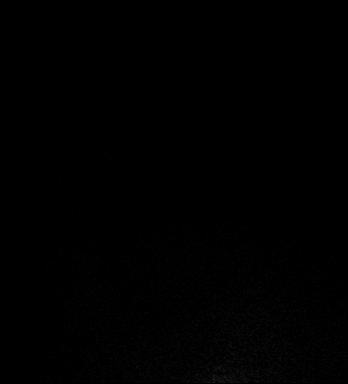
[im 5/24]
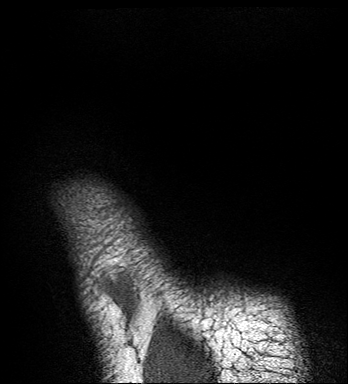
[im 10/24]
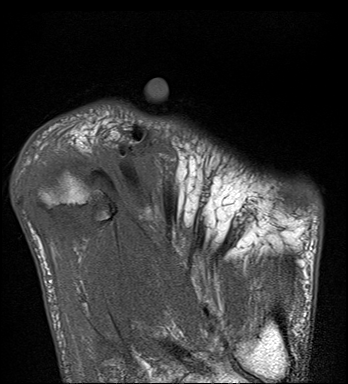
[im 14/24]
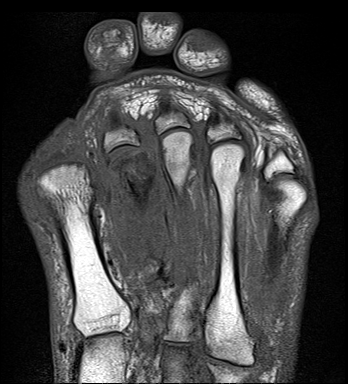
[im 19/24]
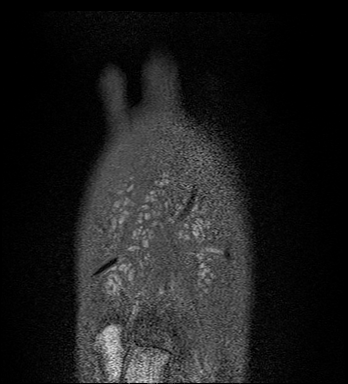
[im 24/24]
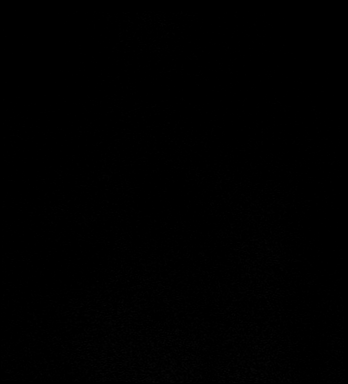

[Series 8: T2 fat-sat · axial · left · 3.0mm · 0.42mm/px · z∈[-80,-3]mm · 6 of 24 slices shown (1 of 2)]
[im 1/24]
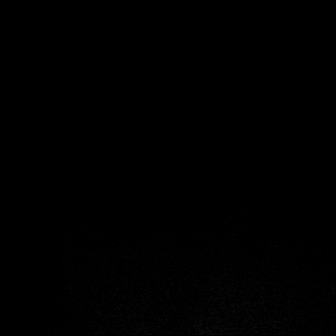
[im 5/24]
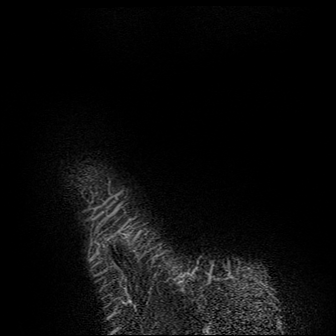
[im 10/24]
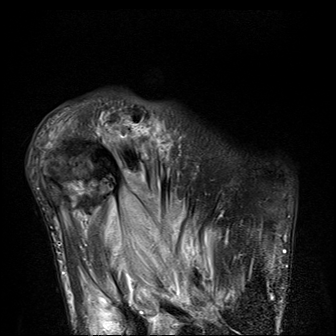
[im 14/24]
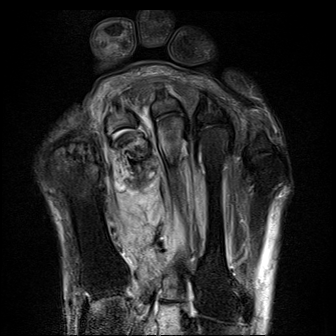
[im 19/24]
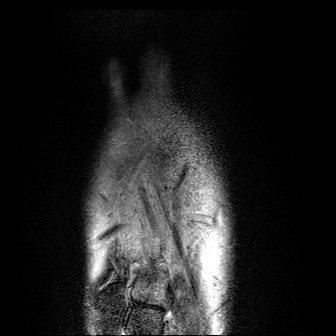
[im 24/24]
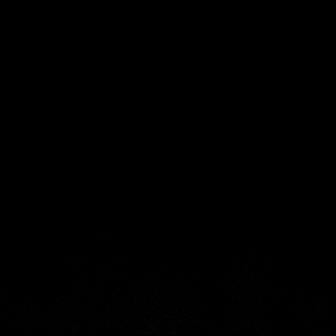

[Series 9: T2 fat-sat · coronal · left · 3.0mm · 0.33mm/px · 10 of 44 slices shown (2 of 2)]
[im 1/44]
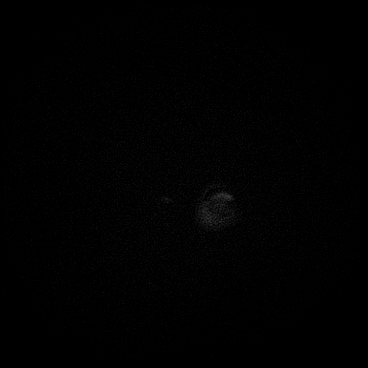
[im 5/44]
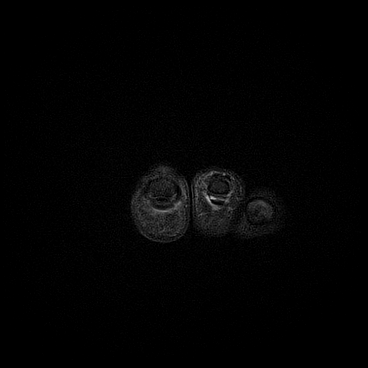
[im 10/44]
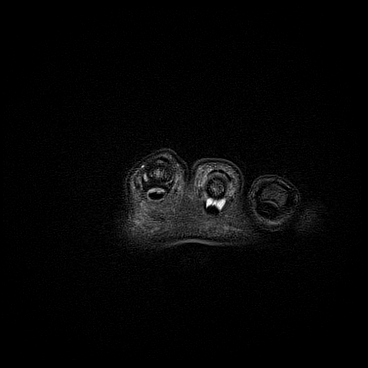
[im 15/44]
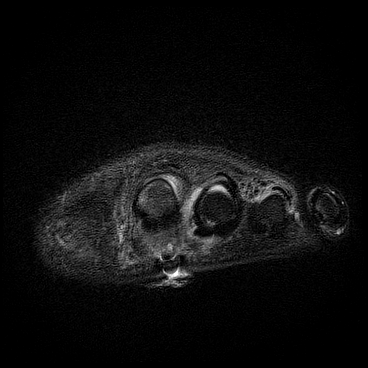
[im 20/44]
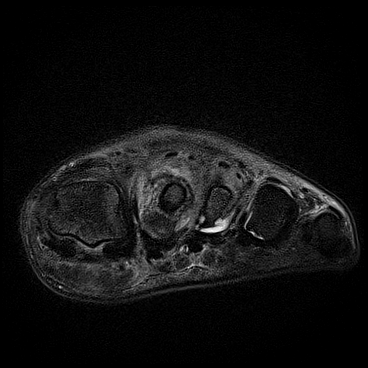
[im 24/44]
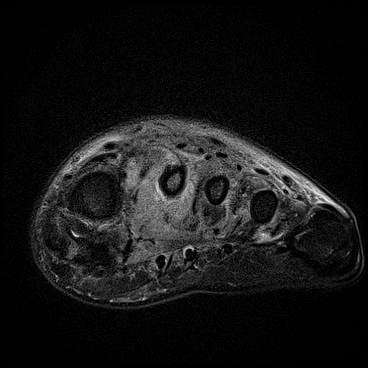
[im 29/44]
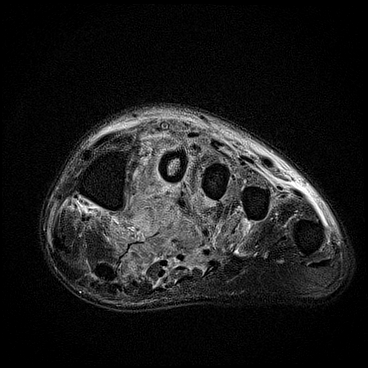
[im 34/44]
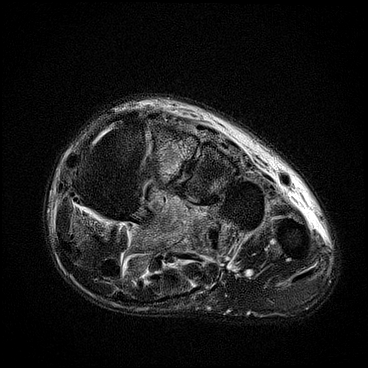
[im 39/44]
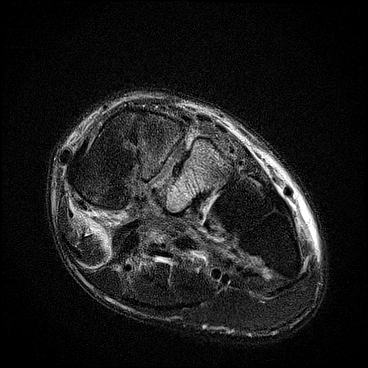
[im 44/44]
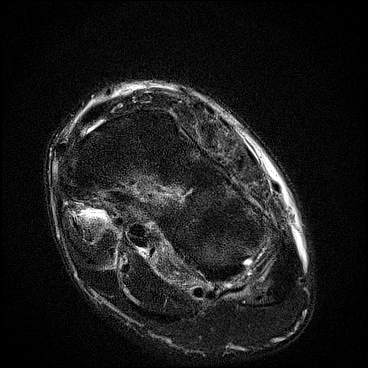

[39 of 40 positions shown; findings below may reference images not displayed]

FINDINGS: Bones/Joint/Cartilage

Abnormal marrow edema with decreased T1 marrow signal involving the
entire second metatarsal. Similar appearing signal in the lateral
cuneiform pathologic fracture of the second metatarsal head and
neck. Faint marrow edema in the third metatarsal with preserved T1
marrow signal, consistent with reactive osteitis. Dorsal and medial
subluxation at the second MTP joint. Large second MTP joint
effusion. Small third MTP joint effusion.

Prior great toe amputation. Degenerative subchondral marrow edema in
the distal medial cuneiform at the first TMT joint.

Ligaments

The lateral collateral ligament at the second MTP joint is torn.
Remaining collateral ligaments are intact.

Muscles and Tendons
Flexor, peroneal and extensor compartment tendons are intact.
Increased T2 signal within the intrinsic muscles of the forefoot,
nonspecific, but likely related to diabetic muscle changes.

Soft tissue
Deep soft tissue ulceration at the plantar base of the second MTP
joint, extending to the joint. No fluid collection or hematoma. No
soft tissue mass.
IMPRESSION: 1. Soft tissue ulceration at the plantar base of the second MTP
joint with extension to the joint. Underlying second MTP joint
septic arthritis with osteomyelitis of the entire second metatarsal,
as well as the lateral cuneiform.
2. Small third MTP joint effusion, potentially reactive, but septic
arthritis is not excluded. Reactive osteitis of the third
metatarsal.
3. No abscess.
4. Pathologic fracture of the second metatarsal head and neck.

## 2020-04-28 NOTE — Progress Notes (Signed)
Office Visit Note   Patient: Jon House           Date of Birth: May 03, 1970           MRN: [id number] Visit Date: 04/28/2020              Requested by: No referring provider defined for this encounter. PCP: Patient, No Pcp Per  Chief Complaint  Patient presents with  . Right Foot - Routine Post Op    04/07/20 partial tarsal excision       HPI: Patient is a 49 year old gentleman status post revision right first ray amputation with partial tarsal excision.  Patient is currently nonweightbearing on crutches.  Assessment & Plan: Visit Diagnoses:  1. Subacute osteomyelitis, right ankle and foot (HCC)     Plan: Continue nonweightbearing on crutches wash with soap and water daily 4 x 4 plus Ace wrap and postoperative shoe and follow-up in 2 weeks anticipate he could resume regular shoewear.  Follow-Up Instructions: Return in about 2 weeks (around 05/12/2020).   Ortho Exam  Patient is alert, oriented, no adenopathy, well-dressed, normal affect, normal respiratory effort. Examination the incision is healing nicely there is no redness cellulitis or drainage.  There is good epithelization of the incision we will harvest the sutures today continue nonweightbearing with crutches.  Imaging: No results found. No images are attached to the encounter.  Labs: Lab Results  Component Value Date   HGBA1C 10.2 (A) 07/07/2019   HGBA1C 9.6 (A) 10/02/2018   HGBA1C 11.1 (H) 07/29/2018   ESRSEDRATE 3 07/07/2019   ESRSEDRATE 69 (H) 10/06/2018   ESRSEDRATE 74 (H) 10/05/2018   CRP 13.7 (H) 10/06/2018   CRP 19.7 (H) 10/05/2018   CRP 19.7 (H) 10/04/2018   REPTSTATUS 02/04/2020 FINAL 01/30/2020   GRAMSTAIN  01/30/2020    RARE WBC PRESENT, PREDOMINANTLY PMN RARE GRAM POSITIVE COCCI    CULT  01/30/2020    FEW ENTEROBACTER CLOACAE FEW ENTEROCOCCUS FAECALIS NO ANAEROBES ISOLATED Performed at [facility], [address], [place], [place] [address]    LABORGA ENTEROBACTER  CLOACAE 01/30/2020   LABORGA ENTEROCOCCUS FAECALIS 01/30/2020     Lab Results  Component Value Date   ALBUMIN 3.9 10/03/2018   ALBUMIN 4.7 08/05/2018   ALBUMIN 3.9 07/29/2018   PREALBUMIN 9.6 (L) 10/04/2018    No results found for: MG No results found for: VD25OH  Lab Results  Component Value Date   PREALBUMIN 9.6 (L) 10/04/2018   CBC EXTENDED Latest Ref Rng & Units 01/27/2020 10/10/2018 10/08/2018  WBC 4.0 - 10.5 K/uL 5.8 9.5 5.2  RBC 4.22 - 5.81 MIL/uL 4.70 3.74(L) 4.22  HGB 13.0 - 17.0 g/dL 65.0 11.2(L) 12.5(L)  HCT 39.0 - 52.0 % 41.4 32.7(L) 36.7(L)  PLT 150 - 400 K/uL 343 305 272  NEUTROABS 1.7 - 7.7 K/uL - - -  LYMPHSABS 0.7 - 4.0 K/uL - - -     Body mass index is 23.33 kg/m.  Orders:  No orders of the defined types were placed in this encounter.  No orders of the defined types were placed in this encounter.    Procedures: No procedures performed  Clinical Data: No additional findings.  ROS:  All other systems negative, except as noted in the HPI. Review of Systems  Objective: Vital Signs: Ht 5\' 9"  (1.753 m)   Wt 158 lb (71.7 kg)   BMI 23.33 kg/m   Specialty Comments:  No specialty comments available.  PMFS History: Patient Active Problem List  Diagnosis Date Noted  . Subacute osteomyelitis, right ankle and foot (HCC)   . Osteomyelitis of great toe of right foot (HCC)   . History of amputation of lesser toe of left foot (HCC) 10/16/2018  . Diabetic infection of left foot (HCC) 10/04/2018  . Diabetic infection of right foot (HCC) 10/04/2018  . Diabetic polyneuropathy associated with type 2 diabetes mellitus (HCC)    Past Medical History:  Diagnosis Date  . Diabetes mellitus without complication (HCC)    Type II  . Infection 10/04/2018   LEFT FOOT  . Osteomyelitis of great toe of left foot (HCC) 07/29/2018  . Subacute osteomyelitis, left ankle and foot (HCC)     Family History  Problem Relation Age of Onset  . Diabetes Mother     Past  Surgical History:  Procedure Laterality Date  . AMPUTATION Left 07/31/2018   Procedure: LEFT GREAT TOE AMPUTATION;  Surgeon: [name], MD;  Location: [facility] OR;  Service: Orthopedics;  Laterality: Left;  . AMPUTATION Left 10/09/2018   Procedure: LEFT FOOT 2ND TOE AND POSSIBLE 3RD TOE AMPUTATION;  Surgeon: [name], MD;  Location: [facility] OR;  Service: Orthopedics;  Laterality: Left;  . AMPUTATION Right 03/17/2020   Procedure: RIGHT GREAT TOE AMPUTATION THROUGH METATARSAL;  Surgeon: [name], MD;  Location: [facility] OR;  Service: Orthopedics;  Laterality: Right;  . APPLICATION OF WOUND VAC Right 03/17/2020   Procedure: APPLICATION OF WOUND VAC;  Surgeon: [name], MD;  Location: [facility] OR;  Service: Orthopedics;  Laterality: Right;  . I & D EXTREMITY Right 01/30/2020   Procedure: DEBRIDEMENT ABSCESS RIGHT FOOT;  Surgeon: [name], MD;  Location: [facility] OR;  Service: Orthopedics;  Laterality: Right;  . I & D EXTREMITY Right 04/07/2020   Procedure: PARTIAL TARSAL EXCISION RIGHT FOOT;  Surgeon: [name], MD;  Location: [facility] OR;  Service: Orthopedics;  Laterality: Right;   Social History   Occupational History  . Not on file  Tobacco Use  . Smoking status: Former Smoker    Years: 15.00  . Smokeless tobacco: Never Used  . Tobacco comment: 03/16/20- quit 12- 15 years ago  Vaping Use  . Vaping Use: Never used  Substance and Sexual Activity  . Alcohol use: Yes    Comment: sometimes  . Drug use: Never  . Sexual activity: Not on file

## 2020-05-11 ENCOUNTER — Ambulatory Visit (INDEPENDENT_AMBULATORY_CARE_PROVIDER_SITE_OTHER): Payer: Self-pay | Admitting: Orthopedic Surgery

## 2020-05-11 ENCOUNTER — Encounter: Payer: Self-pay | Admitting: Orthopedic Surgery

## 2020-05-11 VITALS — Ht 69.0 in | Wt 158.0 lb

## 2020-05-11 DIAGNOSIS — Z89422 Acquired absence of other left toe(s): Secondary | ICD-10-CM

## 2020-05-11 NOTE — Progress Notes (Signed)
Office Visit Note   Patient: Jon House           Date of Birth: 09-25-1970           MRN: [id number] Visit Date: 05/11/2020              Requested by: No referring provider defined for this encounter. PCP: Patient, No Pcp Per  Chief Complaint  Patient presents with  . Right Foot - Routine Post Op    04/07/20 partial tarsal excision right foot       HPI: Patient presents today 1 month status post partial tarsal excision of the right foot.  He is doing well.  He stopped having drainage a couple week days ago  Assessment & Plan: Visit Diagnoses: No diagnosis found.  Plan: Continue with soap and water daily dressing changes follow-up in 2 weeks he may place some weight on his foot but must use the postop shoe  Follow-Up Instructions: No follow-ups on file.   Ortho Exam  Patient is alert, oriented, no adenopathy, well-dressed, normal affect, normal respiratory effort. Right foot eschar over the incision but no surrounding cellulitis no foul odor some of the eschar was debrided to healthy pink skin.  No necrosis swelling is well controlled  Imaging: No results found. No images are attached to the encounter.  Labs: Lab Results  Component Value Date   HGBA1C 10.2 (A) 07/07/2019   HGBA1C 9.6 (A) 10/02/2018   HGBA1C 11.1 (H) 07/29/2018   ESRSEDRATE 3 07/07/2019   ESRSEDRATE 69 (H) 10/06/2018   ESRSEDRATE 74 (H) 10/05/2018   CRP 13.7 (H) 10/06/2018   CRP 19.7 (H) 10/05/2018   CRP 19.7 (H) 10/04/2018   REPTSTATUS 02/04/2020 FINAL 01/30/2020   GRAMSTAIN  01/30/2020    RARE WBC PRESENT, PREDOMINANTLY PMN RARE GRAM POSITIVE COCCI    CULT  01/30/2020    FEW ENTEROBACTER CLOACAE FEW ENTEROCOCCUS FAECALIS NO ANAEROBES ISOLATED Performed at [facility], [address] [place], [place] [address]    LABORGA ENTEROBACTER CLOACAE 01/30/2020   LABORGA ENTEROCOCCUS FAECALIS 01/30/2020     Lab Results  Component Value Date   ALBUMIN 3.9 10/03/2018    ALBUMIN 4.7 08/05/2018   ALBUMIN 3.9 07/29/2018   PREALBUMIN 9.6 (L) 10/04/2018    No results found for: MG No results found for: VD25OH  Lab Results  Component Value Date   PREALBUMIN 9.6 (L) 10/04/2018   CBC EXTENDED Latest Ref Rng & Units 01/27/2020 10/10/2018 10/08/2018  WBC 4.0 - 10.5 K/uL 5.8 9.5 5.2  RBC 4.22 - 5.81 MIL/uL 4.70 3.74(L) 4.22  HGB 13.0 - 17.0 g/dL 86.7 11.2(L) 12.5(L)  HCT 39.0 - 52.0 % 41.4 32.7(L) 36.7(L)  PLT 150 - 400 K/uL 343 305 272  NEUTROABS 1.7 - 7.7 K/uL - - -  LYMPHSABS 0.7 - 4.0 K/uL - - -     Body mass index is 23.33 kg/m.  Orders:  No orders of the defined types were placed in this encounter.  No orders of the defined types were placed in this encounter.    Procedures: No procedures performed  Clinical Data: No additional findings.  ROS:  All other systems negative, except as noted in the HPI. Review of Systems  Objective: Vital Signs: Ht 5\' 9"  (1.753 m)   Wt 158 lb (71.7 kg)   BMI 23.33 kg/m   Specialty Comments:  No specialty comments available.  PMFS History: Patient Active Problem List   Diagnosis Date Noted  . Subacute osteomyelitis,  right ankle and foot (HCC)   . Osteomyelitis of great toe of right foot (HCC)   . History of amputation of lesser toe of left foot (HCC) 10/16/2018  . Diabetic infection of left foot (HCC) 10/04/2018  . Diabetic infection of right foot (HCC) 10/04/2018  . Diabetic polyneuropathy associated with type 2 diabetes mellitus (HCC)    Past Medical History:  Diagnosis Date  . Diabetes mellitus without complication (HCC)    Type II  . Infection 10/04/2018   LEFT FOOT  . Osteomyelitis of great toe of left foot (HCC) 07/29/2018  . Subacute osteomyelitis, left ankle and foot (HCC)     Family History  Problem Relation Age of Onset  . Diabetes Mother     Past Surgical History:  Procedure Laterality Date  . AMPUTATION Left 07/31/2018   Procedure: LEFT GREAT TOE AMPUTATION;  Surgeon: [name], MD;  Location: [facility] OR;  Service: Orthopedics;  Laterality: Left;  . AMPUTATION Left 10/09/2018   Procedure: LEFT FOOT 2ND TOE AND POSSIBLE 3RD TOE AMPUTATION;  Surgeon: [name], MD;  Location: [facility] OR;  Service: Orthopedics;  Laterality: Left;  . AMPUTATION Right 03/17/2020   Procedure: RIGHT GREAT TOE AMPUTATION THROUGH METATARSAL;  Surgeon: [name], MD;  Location: [facility] OR;  Service: Orthopedics;  Laterality: Right;  . APPLICATION OF WOUND VAC Right 03/17/2020   Procedure: APPLICATION OF WOUND VAC;  Surgeon: [name], MD;  Location: [facility] OR;  Service: Orthopedics;  Laterality: Right;  . I & D EXTREMITY Right 01/30/2020   Procedure: DEBRIDEMENT ABSCESS RIGHT FOOT;  Surgeon: [name], MD;  Location: [facility] OR;  Service: Orthopedics;  Laterality: Right;  . I & D EXTREMITY Right 04/07/2020   Procedure: PARTIAL TARSAL EXCISION RIGHT FOOT;  Surgeon: [name], MD;  Location: [facility] OR;  Service: Orthopedics;  Laterality: Right;   Social History   Occupational History  . Not on file  Tobacco Use  . Smoking status: Former Smoker    Years: 15.00  . Smokeless tobacco: Never Used  . Tobacco comment: 03/16/20- quit 12- 15 years ago  Vaping Use  . Vaping Use: Never used  Substance and Sexual Activity  . Alcohol use: Yes    Comment: sometimes  . Drug use: Never  . Sexual activity: Not on file

## 2020-05-25 ENCOUNTER — Encounter: Payer: Self-pay | Admitting: Physician Assistant

## 2020-05-25 ENCOUNTER — Ambulatory Visit (INDEPENDENT_AMBULATORY_CARE_PROVIDER_SITE_OTHER): Payer: Self-pay | Admitting: Physician Assistant

## 2020-05-25 DIAGNOSIS — Z89422 Acquired absence of other left toe(s): Secondary | ICD-10-CM

## 2020-05-25 NOTE — Progress Notes (Signed)
Office Visit Note   Patient: Jon House           Date of Birth: [birth year]           MRN: [id number] Visit Date: 05/25/2020              Requested by: No referring provider defined for this encounter. PCP: Patient, No Pcp Per  Chief Complaint  Patient presents with  . Right Foot - Routine Post Op    04/07/20 right foot partial tarsal excision       HPI: Patient is 5 weeks status post partial tarsal excision of the right foot.  He is also requesting examination of a ulcer on the plantar surface of his previous first and second ray amputation on the left foot.  Has had this for about 6 months.  No foul odor no drainage just wants to have it checked  Assessment & Plan: Visit Diagnoses: No diagnosis found.  Plan: Discussed the importance of obtaining a more supportive shoe than the bedroom slippers she is wearing today.  Would also use a filler and orthotic.  He does not have insurance so he is going to look for stiff soled shoe first.  Given him a felt donut for the ulcer on his left foot.  Follow-up in 1 month  Follow-Up Instructions: No follow-ups on file.   Ortho Exam  Patient is alert, oriented, no adenopathy, well-dressed, normal affect, normal respiratory effort. Incision on right is completely healed just some eschar.  Pulses palpable.  No cellulitis no drainage no fluctuance no sign of repeat infection Right foot he has a grade 1 Wagner ulcer approximately 2 cm in diameter beneath the metatarsal head there is no surrounding erythema no cellulitis no foul odor minimal drainage.  After obtaining verbal consent this was debrided to a healthy surface Band-Aid was placed.  Does not probe to bone  Imaging: No results found. No images are attached to the encounter.  Labs: Lab Results  Component Value Date   HGBA1C 10.2 (A) 07/07/2019   HGBA1C 9.6 (A) 10/02/2018   HGBA1C 11.1 (H) 07/29/2018   ESRSEDRATE 3 07/07/2019   ESRSEDRATE 69 (H) 10/06/2018   ESRSEDRATE 74  (H) 10/05/2018   CRP 13.7 (H) 10/06/2018   CRP 19.7 (H) 10/05/2018   CRP 19.7 (H) 10/04/2018   REPTSTATUS 02/04/2020 FINAL 01/30/2020   GRAMSTAIN  01/30/2020    RARE WBC PRESENT, PREDOMINANTLY PMN RARE GRAM POSITIVE COCCI    CULT  01/30/2020    FEW ENTEROBACTER CLOACAE FEW ENTEROCOCCUS FAECALIS NO ANAEROBES ISOLATED Performed at [facility], [address] [place], [place] [address]    LABORGA ENTEROBACTER CLOACAE 01/30/2020   LABORGA ENTEROCOCCUS FAECALIS 01/30/2020     Lab Results  Component Value Date   ALBUMIN 3.9 10/03/2018   ALBUMIN 4.7 08/05/2018   ALBUMIN 3.9 07/29/2018   PREALBUMIN 9.6 (L) 10/04/2018    No results found for: MG No results found for: VD25OH  Lab Results  Component Value Date   PREALBUMIN 9.6 (L) 10/04/2018   CBC EXTENDED Latest Ref Rng & Units 01/27/2020 10/10/2018 10/08/2018  WBC 4.0 - 10.5 K/uL 5.8 9.5 5.2  RBC 4.22 - 5.81 MIL/uL 4.70 3.74(L) 4.22  HGB 13.0 - 17.0 g/dL 02.6 11.2(L) 12.5(L)  HCT 39.0 - 52.0 % 41.4 32.7(L) 36.7(L)  PLT 150 - 400 K/uL 343 305 272  NEUTROABS 1.7 - 7.7 K/uL - - -  LYMPHSABS 0.7 - 4.0 K/uL - - -  There is no height or weight on file to calculate BMI.  Orders:  No orders of the defined types were placed in this encounter.  No orders of the defined types were placed in this encounter.    Procedures: No procedures performed  Clinical Data: No additional findings.  ROS:  All other systems negative, except as noted in the HPI. Review of Systems  Objective: Vital Signs: There were no vitals taken for this visit.  Specialty Comments:  No specialty comments available.  PMFS History: Patient Active Problem List   Diagnosis Date Noted  . Subacute osteomyelitis, right ankle and foot (HCC)   . Osteomyelitis of great toe of right foot (HCC)   . History of amputation of lesser toe of left foot (HCC) 10/16/2018  . Diabetic infection of left foot (HCC) 10/04/2018  . Diabetic infection of right  foot (HCC) 10/04/2018  . Diabetic polyneuropathy associated with type 2 diabetes mellitus (HCC)    Past Medical History:  Diagnosis Date  . Diabetes mellitus without complication (HCC)    Type II  . Infection 10/04/2018   LEFT FOOT  . Osteomyelitis of great toe of left foot (HCC) 07/29/2018  . Subacute osteomyelitis, left ankle and foot (HCC)     Family History  Problem Relation Age of Onset  . Diabetes Mother     Past Surgical History:  Procedure Laterality Date  . AMPUTATION Left 07/31/2018   Procedure: LEFT GREAT TOE AMPUTATION;  Surgeon: [name], MD;  Location: [facility] OR;  Service: Orthopedics;  Laterality: Left;  . AMPUTATION Left 10/09/2018   Procedure: LEFT FOOT 2ND TOE AND POSSIBLE 3RD TOE AMPUTATION;  Surgeon: [name], MD;  Location: [facility] OR;  Service: Orthopedics;  Laterality: Left;  . AMPUTATION Right 03/17/2020   Procedure: RIGHT GREAT TOE AMPUTATION THROUGH METATARSAL;  Surgeon: [name], MD;  Location: [facility] OR;  Service: Orthopedics;  Laterality: Right;  . APPLICATION OF WOUND VAC Right 03/17/2020   Procedure: APPLICATION OF WOUND VAC;  Surgeon: [name], MD;  Location: [facility] OR;  Service: Orthopedics;  Laterality: Right;  . I & D EXTREMITY Right 01/30/2020   Procedure: DEBRIDEMENT ABSCESS RIGHT FOOT;  Surgeon: [name], MD;  Location: [facility] OR;  Service: Orthopedics;  Laterality: Right;  . I & D EXTREMITY Right 04/07/2020   Procedure: PARTIAL TARSAL EXCISION RIGHT FOOT;  Surgeon: [name], MD;  Location: [facility] OR;  Service: Orthopedics;  Laterality: Right;   Social History   Occupational History  . Not on file  Tobacco Use  . Smoking status: Former Smoker    Years: 15.00  . Smokeless tobacco: Never Used  . Tobacco comment: 03/16/20- quit 12- 15 years ago  Vaping Use  . Vaping Use: Never used  Substance and Sexual Activity  . Alcohol use: Yes    Comment: sometimes  . Drug use: Never  . Sexual activity: Not on file

## 2020-06-22 ENCOUNTER — Ambulatory Visit (INDEPENDENT_AMBULATORY_CARE_PROVIDER_SITE_OTHER): Payer: Self-pay | Admitting: Physician Assistant

## 2020-06-22 ENCOUNTER — Encounter: Payer: Self-pay | Admitting: Orthopedic Surgery

## 2020-06-22 DIAGNOSIS — E11628 Type 2 diabetes mellitus with other skin complications: Secondary | ICD-10-CM

## 2020-06-22 DIAGNOSIS — L089 Local infection of the skin and subcutaneous tissue, unspecified: Secondary | ICD-10-CM

## 2020-06-22 NOTE — Progress Notes (Signed)
Office Visit Note   Patient: Jon House           Date of Birth: [birth year]           MRN: [id number] Visit Date: 06/22/2020              Requested by: No referring provider defined for this encounter. PCP: Patient, No Pcp Per  Chief Complaint  Patient presents with  . Right Foot - Routine Post Op    04/07/20 right foot partial tarsal excision       HPI: Patient presents today for follow-up.  Month status post right partial tarsal excision.  With regards to his right foot he is doing well.  He has not obtained supportive shoes and is in a croc.  His only complaint is of dependent swelling.  He does have a compression sock.  He also continues to complain about a callus underneath the left first metatarsal.  He tried to debride this himself and caused some bleeding.  Assessment & Plan: Visit Diagnoses: No diagnosis found.  Plan: Encouraged to wear the sock on the right to help with swelling.  I do have concerns of the callus on the left.  He will go into his postop shoe with a felt relieving doughnut.  Should watch this daily and follow-up in 2 weeks.  Sooner if any changes  Follow-Up Instructions: No follow-ups on file.   Ortho Exam  Patient is alert, oriented, no adenopathy, well-dressed, normal affect, normal respiratory effort. Right foot well-healed surgical incision no cellulitis mild swelling no cellulitis palpable pulse.  Left foot no cellulitis no swelling he does have a callus that has been bleeding but no foul odor minimal bloody drainage.  This was debrided to a healthy surface.  One area does probe somewhat deeply but not to bone  Imaging: No results found. No images are attached to the encounter.  Labs: Lab Results  Component Value Date   HGBA1C 10.2 (A) 07/07/2019   HGBA1C 9.6 (A) 10/02/2018   HGBA1C 11.1 (H) 07/29/2018   ESRSEDRATE 3 07/07/2019   ESRSEDRATE 69 (H) 10/06/2018   ESRSEDRATE 74 (H) 10/05/2018   CRP 13.7 (H) 10/06/2018   CRP 19.7  (H) 10/05/2018   CRP 19.7 (H) 10/04/2018   REPTSTATUS 02/04/2020 FINAL 01/30/2020   GRAMSTAIN  01/30/2020    RARE WBC PRESENT, PREDOMINANTLY PMN RARE GRAM POSITIVE COCCI    CULT  01/30/2020    FEW ENTEROBACTER CLOACAE FEW ENTEROCOCCUS FAECALIS NO ANAEROBES ISOLATED Performed at [facility], [address], [place], [place] [address]    LABORGA ENTEROBACTER CLOACAE 01/30/2020   LABORGA ENTEROCOCCUS FAECALIS 01/30/2020     Lab Results  Component Value Date   ALBUMIN 3.9 10/03/2018   ALBUMIN 4.7 08/05/2018   ALBUMIN 3.9 07/29/2018   PREALBUMIN 9.6 (L) 10/04/2018    No results found for: MG No results found for: VD25OH  Lab Results  Component Value Date   PREALBUMIN 9.6 (L) 10/04/2018   CBC EXTENDED Latest Ref Rng & Units 01/27/2020 10/10/2018 10/08/2018  WBC 4.0 - 10.5 K/uL 5.8 9.5 5.2  RBC 4.22 - 5.81 MIL/uL 4.70 3.74(L) 4.22  HGB 13.0 - 17.0 g/dL 19.1 11.2(L) 12.5(L)  HCT 39.0 - 52.0 % 41.4 32.7(L) 36.7(L)  PLT 150 - 400 K/uL 343 305 272  NEUTROABS 1.7 - 7.7 K/uL - - -  LYMPHSABS 0.7 - 4.0 K/uL - - -     There is no height or weight on file to calculate  BMI.  Orders:  No orders of the defined types were placed in this encounter.  No orders of the defined types were placed in this encounter.    Procedures: No procedures performed  Clinical Data: No additional findings.  ROS:  All other systems negative, except as noted in the HPI. Review of Systems  Objective: Vital Signs: There were no vitals taken for this visit.  Specialty Comments:  No specialty comments available.  PMFS History: Patient Active Problem List   Diagnosis Date Noted  . Subacute osteomyelitis, right ankle and foot (HCC)   . Osteomyelitis of great toe of right foot (HCC)   . History of amputation of lesser toe of left foot (HCC) 10/16/2018  . Diabetic infection of left foot (HCC) 10/04/2018  . Diabetic infection of right foot (HCC) 10/04/2018  . Diabetic polyneuropathy  associated with type 2 diabetes mellitus (HCC)    Past Medical History:  Diagnosis Date  . Diabetes mellitus without complication (HCC)    Type II  . Infection 10/04/2018   LEFT FOOT  . Osteomyelitis of great toe of left foot (HCC) 07/29/2018  . Subacute osteomyelitis, left ankle and foot (HCC)     Family History  Problem Relation Age of Onset  . Diabetes Mother     Past Surgical History:  Procedure Laterality Date  . AMPUTATION Left 07/31/2018   Procedure: LEFT GREAT TOE AMPUTATION;  Surgeon: [name], MD;  Location: [facility] OR;  Service: Orthopedics;  Laterality: Left;  . AMPUTATION Left 10/09/2018   Procedure: LEFT FOOT 2ND TOE AND POSSIBLE 3RD TOE AMPUTATION;  Surgeon: [name], MD;  Location: [facility] OR;  Service: Orthopedics;  Laterality: Left;  . AMPUTATION Right 03/17/2020   Procedure: RIGHT GREAT TOE AMPUTATION THROUGH METATARSAL;  Surgeon: [name], MD;  Location: [facility] OR;  Service: Orthopedics;  Laterality: Right;  . APPLICATION OF WOUND VAC Right 03/17/2020   Procedure: APPLICATION OF WOUND VAC;  Surgeon: [name], MD;  Location: [facility] OR;  Service: Orthopedics;  Laterality: Right;  . I & D EXTREMITY Right 01/30/2020   Procedure: DEBRIDEMENT ABSCESS RIGHT FOOT;  Surgeon: [name], MD;  Location: [facility] OR;  Service: Orthopedics;  Laterality: Right;  . I & D EXTREMITY Right 04/07/2020   Procedure: PARTIAL TARSAL EXCISION RIGHT FOOT;  Surgeon: [name], MD;  Location: [facility] OR;  Service: Orthopedics;  Laterality: Right;   Social History   Occupational History  . Not on file  Tobacco Use  . Smoking status: Former Smoker    Years: 15.00  . Smokeless tobacco: Never Used  . Tobacco comment: 03/16/20- quit 12- 15 years ago  Vaping Use  . Vaping Use: Never used  Substance and Sexual Activity  . Alcohol use: Yes    Comment: sometimes  . Drug use: Never  . Sexual activity: Not on file

## 2020-07-06 ENCOUNTER — Ambulatory Visit: Payer: Self-pay | Admitting: Physician Assistant

## 2020-07-14 ENCOUNTER — Encounter: Payer: Self-pay | Admitting: Physician Assistant

## 2020-07-14 ENCOUNTER — Ambulatory Visit (INDEPENDENT_AMBULATORY_CARE_PROVIDER_SITE_OTHER): Payer: Self-pay | Admitting: Physician Assistant

## 2020-07-14 ENCOUNTER — Other Ambulatory Visit: Payer: Self-pay

## 2020-07-14 DIAGNOSIS — M869 Osteomyelitis, unspecified: Secondary | ICD-10-CM

## 2020-07-14 NOTE — Progress Notes (Signed)
Office Visit Note   Patient: Jon House           Date of Birth: 1971-03-17           MRN: [id number] Visit Date: 07/14/2020              Requested by: No referring provider defined for this encounter. PCP: Patient, No Pcp Per  Chief Complaint  Patient presents with  . Left Foot - Follow-up      HPI: Patient presents today for follow-up on his right and left foot.  On his right foot he is status post medial column amputation.  He is overall doing well his only complaint is of swelling.  He feels like he does not have enough support on the medial side of his foot and it causes swelling in his ankle.  He has obtained very good supportive shoes.  On the left he follows up for an ulcer beneath his first metatarsophalangeal head.  He says he has difficulty stretching because of the nerves and veins.  At his last visit I had given him a felt pad to put inside a postop shoe that he had at home from his previous surgery.  He says today he did not do that because he wanted to be using the new shoes he bought  Assessment & Plan: Visit Diagnoses: No diagnosis found.  Plan: Have asked that he again go into the shoe to help heal this area.  Explained to him the reasoning behind this.  He also asked about using home remedies such as hot water and salt on the area.  I told him that given his insensate neuropathy this would be very dangerous.  Follow-up in 3 weeks.  Have also given him a prescription for a carbon fiber plate and filler  Follow-Up Instructions: No follow-ups on file.   Ortho Exam  Patient is alert, oriented, no adenopathy, well-dressed, normal affect, normal respiratory effort. Examination of the right foot well-healed surgical incision mild soft tissue swelling but no induration or ascending cellulitis. Left foot he has an ulcer beneath the first metatarsal head status post great toe amputation.  It is mostly callused with just a small area of split skin in the first  webspace on the plantar surface.  There has been some previous bloody drainage from this.  It probes only a millimeter down.  Significant callusing.  After verbal consent was obtained this was debrided to a soft surface.  No foul odor no drainage no ascending cellulitis  Imaging: No results found. No images are attached to the encounter.  Labs: Lab Results  Component Value Date   HGBA1C 10.2 (A) 07/07/2019   HGBA1C 9.6 (A) 10/02/2018   HGBA1C 11.1 (H) 07/29/2018   ESRSEDRATE 3 07/07/2019   ESRSEDRATE 69 (H) 10/06/2018   ESRSEDRATE 74 (H) 10/05/2018   CRP 13.7 (H) 10/06/2018   CRP 19.7 (H) 10/05/2018   CRP 19.7 (H) 10/04/2018   REPTSTATUS 02/04/2020 FINAL 01/30/2020   GRAMSTAIN  01/30/2020    RARE WBC PRESENT, PREDOMINANTLY PMN RARE GRAM POSITIVE COCCI    CULT  01/30/2020    FEW ENTEROBACTER CLOACAE FEW ENTEROCOCCUS FAECALIS NO ANAEROBES ISOLATED Performed at [facility], [address] [place], [place] [address]    LABORGA ENTEROBACTER CLOACAE 01/30/2020   LABORGA ENTEROCOCCUS FAECALIS 01/30/2020     Lab Results  Component Value Date   ALBUMIN 3.9 10/03/2018   ALBUMIN 4.7 08/05/2018   ALBUMIN 3.9 07/29/2018   PREALBUMIN 9.6 (  L) 10/04/2018    No results found for: MG No results found for: VD25OH  Lab Results  Component Value Date   PREALBUMIN 9.6 (L) 10/04/2018   CBC EXTENDED Latest Ref Rng & Units 01/27/2020 10/10/2018 10/08/2018  WBC 4.0 - 10.5 K/uL 5.8 9.5 5.2  RBC 4.22 - 5.81 MIL/uL 4.70 3.74(L) 4.22  HGB 13.0 - 17.0 g/dL 95.1 11.2(L) 12.5(L)  HCT 39.0 - 52.0 % 41.4 32.7(L) 36.7(L)  PLT 150 - 400 K/uL 343 305 272  NEUTROABS 1.7 - 7.7 K/uL - - -  LYMPHSABS 0.7 - 4.0 K/uL - - -     There is no height or weight on file to calculate BMI.  Orders:  No orders of the defined types were placed in this encounter.  No orders of the defined types were placed in this encounter.    Procedures: No procedures performed  Clinical Data: No additional  findings.  ROS:  All other systems negative, except as noted in the HPI. Review of Systems  Objective: Vital Signs: There were no vitals taken for this visit.  Specialty Comments:  No specialty comments available.  PMFS History: Patient Active Problem List   Diagnosis Date Noted  . Subacute osteomyelitis, right ankle and foot (HCC)   . Osteomyelitis of great toe of right foot (HCC)   . History of amputation of lesser toe of left foot (HCC) 10/16/2018  . Diabetic infection of left foot (HCC) 10/04/2018  . Diabetic infection of right foot (HCC) 10/04/2018  . Diabetic polyneuropathy associated with type 2 diabetes mellitus (HCC)    Past Medical History:  Diagnosis Date  . Diabetes mellitus without complication (HCC)    Type II  . Infection 10/04/2018   LEFT FOOT  . Osteomyelitis of great toe of left foot (HCC) 07/29/2018  . Subacute osteomyelitis, left ankle and foot (HCC)     Family History  Problem Relation Age of Onset  . Diabetes Mother     Past Surgical History:  Procedure Laterality Date  . AMPUTATION Left 07/31/2018   Procedure: LEFT GREAT TOE AMPUTATION;  Surgeon: [name], MD;  Location: [facility] OR;  Service: Orthopedics;  Laterality: Left;  . AMPUTATION Left 10/09/2018   Procedure: LEFT FOOT 2ND TOE AND POSSIBLE 3RD TOE AMPUTATION;  Surgeon: [name], MD;  Location: [facility] OR;  Service: Orthopedics;  Laterality: Left;  . AMPUTATION Right 03/17/2020   Procedure: RIGHT GREAT TOE AMPUTATION THROUGH METATARSAL;  Surgeon: [name], MD;  Location: [facility] OR;  Service: Orthopedics;  Laterality: Right;  . APPLICATION OF WOUND VAC Right 03/17/2020   Procedure: APPLICATION OF WOUND VAC;  Surgeon: [name], MD;  Location: [facility] OR;  Service: Orthopedics;  Laterality: Right;  . I & D EXTREMITY Right 01/30/2020   Procedure: DEBRIDEMENT ABSCESS RIGHT FOOT;  Surgeon: [name], MD;  Location: [facility] OR;  Service: Orthopedics;  Laterality: Right;  . I & D EXTREMITY Right  04/07/2020   Procedure: PARTIAL TARSAL EXCISION RIGHT FOOT;  Surgeon: [name], MD;  Location: [facility] OR;  Service: Orthopedics;  Laterality: Right;   Social History   Occupational History  . Not on file  Tobacco Use  . Smoking status: Former Smoker    Years: 15.00  . Smokeless tobacco: Never Used  . Tobacco comment: 03/16/20- quit 12- 15 years ago  Vaping Use  . Vaping Use: Never used  Substance and Sexual Activity  . Alcohol use: Yes    Comment: sometimes  .  Drug use: Never  . Sexual activity: Not on file

## 2020-07-21 ENCOUNTER — Encounter: Payer: Self-pay | Admitting: Physician Assistant

## 2020-07-21 ENCOUNTER — Ambulatory Visit (INDEPENDENT_AMBULATORY_CARE_PROVIDER_SITE_OTHER): Payer: Self-pay

## 2020-07-21 ENCOUNTER — Ambulatory Visit (INDEPENDENT_AMBULATORY_CARE_PROVIDER_SITE_OTHER): Payer: Self-pay | Admitting: Physician Assistant

## 2020-07-21 DIAGNOSIS — L089 Local infection of the skin and subcutaneous tissue, unspecified: Secondary | ICD-10-CM

## 2020-07-21 DIAGNOSIS — E11628 Type 2 diabetes mellitus with other skin complications: Secondary | ICD-10-CM

## 2020-07-21 MED ORDER — DOXYCYCLINE HYCLATE 100 MG PO TABS: 100.0000 mg | ORAL_TABLET | Freq: Two times a day (BID) | ORAL | 0 refills | Status: DC

## 2020-07-21 MED FILL — ?DOXYCYCLINE HYCLATE 100 MG: 100 | 30 days supply | Qty: 60 | Fill #0

## 2020-07-21 NOTE — Progress Notes (Signed)
Office Visit Note   Patient: Jon House           Date of Birth: 1970/11/15           MRN: [id number] Visit Date: 07/21/2020              Requested by: No referring provider defined for this encounter. PCP: Patient, No Pcp Per  No chief complaint on file.     HPI: Patient presents today with concerns about his left foot.  He is status post left great toe and second ray amputation on this foot.  He has had a persistent callus beneath the first MTP that has been pared down periodically.  He is concerned because now laterally on the fifth MTP joint he has pain and skin breakdown.  He denies any fever or chills.  He thinks this is from wearing compression socks.  Assessment & Plan: Visit Diagnoses:  1. Diabetic infection of left foot (HCC)     Plan: Patient should ideally be nonweightbearing.  He will wash daily with soap and water.  He is [facility]  I did remove the delaminated skin around the fifth MTP plantar surface could not express any purulent drainage but there is a mild foul odor.  Probes very close to bone.  I do have concerns for further infection.  Follow-Up Instructions: No follow-ups on file.   Ortho Exam  Patient is alert, oriented, no adenopathy, well-dressed, normal affect, normal respiratory effort.  Dorsalis pedis pulse is palpable.  Great toe callus was debrided again to soft surfaces no abscess no surrounding cellulitis he does however have a place that does probe deeply into soft tissue.  On the lateral side of his foot he has a new area of skin breakdown and ulcer..  After obtaining verbal consent the delaminated skin was debrided back.  Bleeding was touched with silver nitrate stick.  This did probe close to bone.  Could not express any purulent drainage no ascending cellulitis  Imaging: XR Foot Complete Left  Result Date: 07/21/2020 X-rays of his foot demonstrate previous second  ray and great toe amputation.  Question some lytic change at the great toe.  No findings consistent with abscess.  No images are attached to the encounter.  Labs: Lab Results  Component Value Date   HGBA1C 10.2 (A) 07/07/2019   HGBA1C 9.6 (A) 10/02/2018   HGBA1C 11.1 (H) 07/29/2018   ESRSEDRATE 3 07/07/2019   ESRSEDRATE 69 (H) 10/06/2018   ESRSEDRATE 74 (H) 10/05/2018   CRP 13.7 (H) 10/06/2018   CRP 19.7 (H) 10/05/2018   CRP 19.7 (H) 10/04/2018   REPTSTATUS 02/04/2020 FINAL 01/30/2020   GRAMSTAIN  01/30/2020    RARE WBC PRESENT, PREDOMINANTLY PMN RARE GRAM POSITIVE COCCI    CULT  01/30/2020    FEW ENTEROBACTER CLOACAE FEW ENTEROCOCCUS FAECALIS NO ANAEROBES ISOLATED Performed at [facility], [address], [place], [place] [address]    LABORGA ENTEROBACTER CLOACAE 01/30/2020   LABORGA ENTEROCOCCUS FAECALIS 01/30/2020     Lab Results  Component Value Date   ALBUMIN 3.9 10/03/2018   ALBUMIN 4.7 08/05/2018   ALBUMIN 3.9 07/29/2018   PREALBUMIN 9.6 (L) 10/04/2018    No results found for: MG No results found for: VD25OH  Lab Results  Component Value Date   PREALBUMIN 9.6 (L) 10/04/2018   CBC EXTENDED Latest Ref Rng & Units 01/27/2020 10/10/2018 10/08/2018  WBC 4.0 - 10.5 K/uL  5.8 9.5 5.2  RBC 4.22 - 5.81 MIL/uL 4.70 3.74(L) 4.22  HGB 13.0 - 17.0 g/dL 76.5 11.2(L) 12.5(L)  HCT 39.0 - 52.0 % 41.4 32.7(L) 36.7(L)  PLT 150 - 400 K/uL 343 305 272  NEUTROABS 1.7 - 7.7 K/uL - - -  LYMPHSABS 0.7 - 4.0 K/uL - - -     There is no height or weight on file to calculate BMI.  Orders:  Orders Placed This Encounter  Procedures  . XR Foot Complete Left   Meds ordered this encounter  Medications  . doxycycline (VIBRA-TABS) 100 MG tablet    Sig: Take 1 tablet (100 mg total) by mouth 2 (two) times daily.    Dispense:  60 tablet    Refill:  0     Procedures: No procedures performed  Clinical Data: No additional findings.  ROS:  All other systems negative,  except as noted in the HPI. Review of Systems  Objective: Vital Signs: There were no vitals taken for this visit.  Specialty Comments:  No specialty comments available.  PMFS History: Patient Active Problem List   Diagnosis Date Noted  . Subacute osteomyelitis, right ankle and foot (HCC)   . Osteomyelitis of great toe of right foot (HCC)   . History of amputation of lesser toe of left foot (HCC) 10/16/2018  . Diabetic infection of left foot (HCC) 10/04/2018  . Diabetic infection of right foot (HCC) 10/04/2018  . Diabetic polyneuropathy associated with type 2 diabetes mellitus (HCC)    Past Medical History:  Diagnosis Date  . Diabetes mellitus without complication (HCC)    Type II  . Infection 10/04/2018   LEFT FOOT  . Osteomyelitis of great toe of left foot (HCC) 07/29/2018  . Subacute osteomyelitis, left ankle and foot (HCC)     Family History  Problem Relation Age of Onset  . Diabetes Mother     Past Surgical History:  Procedure Laterality Date  . AMPUTATION Left 07/31/2018   Procedure: LEFT GREAT TOE AMPUTATION;  Surgeon: [name], MD;  Location: [facility] OR;  Service: Orthopedics;  Laterality: Left;  . AMPUTATION Left 10/09/2018   Procedure: LEFT FOOT 2ND TOE AND POSSIBLE 3RD TOE AMPUTATION;  Surgeon: [name], MD;  Location: [facility] OR;  Service: Orthopedics;  Laterality: Left;  . AMPUTATION Right 03/17/2020   Procedure: RIGHT GREAT TOE AMPUTATION THROUGH METATARSAL;  Surgeon: [name], MD;  Location: [facility] OR;  Service: Orthopedics;  Laterality: Right;  . APPLICATION OF WOUND VAC Right 03/17/2020   Procedure: APPLICATION OF WOUND VAC;  Surgeon: [name], MD;  Location: [facility] OR;  Service: Orthopedics;  Laterality: Right;  . I & D EXTREMITY Right 01/30/2020   Procedure: DEBRIDEMENT ABSCESS RIGHT FOOT;  Surgeon: [name], MD;  Location: [facility] OR;  Service: Orthopedics;  Laterality: Right;  . I & D EXTREMITY Right 04/07/2020   Procedure: PARTIAL TARSAL EXCISION  RIGHT FOOT;  Surgeon: [name], MD;  Location: [facility] OR;  Service: Orthopedics;  Laterality: Right;   Social History   Occupational History  . Not on file  Tobacco Use  . Smoking status: Former Smoker    Years: 15.00  . Smokeless tobacco: Never Used  . Tobacco comment: 03/16/20- quit 12- 15 years ago  Vaping Use  . Vaping Use: Never used  Substance and Sexual Activity  . Alcohol use: Yes    Comment: sometimes  . Drug use: Never  . Sexual activity: Not on file

## 2020-07-26 ENCOUNTER — Encounter: Payer: Self-pay | Admitting: Orthopedic Surgery

## 2020-07-26 ENCOUNTER — Ambulatory Visit (INDEPENDENT_AMBULATORY_CARE_PROVIDER_SITE_OTHER): Payer: Self-pay | Admitting: Orthopedic Surgery

## 2020-07-26 DIAGNOSIS — L97521 Non-pressure chronic ulcer of other part of left foot limited to breakdown of skin: Secondary | ICD-10-CM

## 2020-07-26 NOTE — Progress Notes (Signed)
Office Visit Note   Patient: Jon House           Date of Birth: Sep 17, 1970           MRN: [id number] Visit Date: 07/26/2020              Requested by: No referring provider defined for this encounter. PCP: Patient, No Pcp Per  Chief Complaint  Patient presents with  . Left Foot - Follow-up  . Right Foot - Follow-up    04/07/20 right foot partial tarsal excision       HPI: Patient is a 50 year old gentleman who was seen for both lower extremities he is status post bone excision of the right foot secondary to infection status post a left foot second ray amputation as well as a ulcer beneath the fifth metatarsal head left foot status post great toe amputation.  Assessment & Plan: Visit Diagnoses:  1. Non-pressure chronic ulcer of other part of left foot limited to breakdown of skin (HCC)     Plan: Patient has a postoperative shoe with felt relieving donuts to unload pressure from the first metatarsal head and fifth metatarsal head of the left foot.  Patient will continue with the protective shoe wear minimize weightbearing complete his course of doxycycline.  Follow-Up Instructions: Return in about 4 weeks (around 08/23/2020).   Ortho Exam  Patient is alert, oriented, no adenopathy, well-dressed, normal affect, normal respiratory effort. Examination the right foot is well-healed there is no redness no cellulitis no open wound or drainage.  The left foot has a healed ulcer beneath the first metatarsal head there is some callus.  There is a flap ulcer beneath the fifth metatarsal head of the left foot the wound is flat with good granulation tissue 2 cm in diameter 0.1 mm deep.  Imaging: No results found. No images are attached to the encounter.  Labs: Lab Results  Component Value Date   HGBA1C 10.2 (A) 07/07/2019   HGBA1C 9.6 (A) 10/02/2018   HGBA1C 11.1 (H) 07/29/2018   ESRSEDRATE 3 07/07/2019   ESRSEDRATE 69 (H) 10/06/2018   ESRSEDRATE 74 (H) 10/05/2018   CRP  13.7 (H) 10/06/2018   CRP 19.7 (H) 10/05/2018   CRP 19.7 (H) 10/04/2018   REPTSTATUS 02/04/2020 FINAL 01/30/2020   GRAMSTAIN  01/30/2020    RARE WBC PRESENT, PREDOMINANTLY PMN RARE GRAM POSITIVE COCCI    CULT  01/30/2020    FEW ENTEROBACTER CLOACAE FEW ENTEROCOCCUS FAECALIS NO ANAEROBES ISOLATED Performed at [facility], [address], [place], [place] [address]    LABORGA ENTEROBACTER CLOACAE 01/30/2020   LABORGA ENTEROCOCCUS FAECALIS 01/30/2020     Lab Results  Component Value Date   ALBUMIN 3.9 10/03/2018   ALBUMIN 4.7 08/05/2018   ALBUMIN 3.9 07/29/2018   PREALBUMIN 9.6 (L) 10/04/2018    No results found for: MG No results found for: VD25OH  Lab Results  Component Value Date   PREALBUMIN 9.6 (L) 10/04/2018   CBC EXTENDED Latest Ref Rng & Units 01/27/2020 10/10/2018 10/08/2018  WBC 4.0 - 10.5 K/uL 5.8 9.5 5.2  RBC 4.22 - 5.81 MIL/uL 4.70 3.74(L) 4.22  HGB 13.0 - 17.0 g/dL 84.1 11.2(L) 12.5(L)  HCT 39.0 - 52.0 % 41.4 32.7(L) 36.7(L)  PLT 150 - 400 K/uL 343 305 272  NEUTROABS 1.7 - 7.7 K/uL - - -  LYMPHSABS 0.7 - 4.0 K/uL - - -     There is no height or weight on file to calculate BMI.  Orders:  No orders of the defined types were placed in this encounter.  No orders of the defined types were placed in this encounter.    Procedures: No procedures performed  Clinical Data: No additional findings.  ROS:  All other systems negative, except as noted in the HPI. Review of Systems  Objective: Vital Signs: There were no vitals taken for this visit.  Specialty Comments:  No specialty comments available.  PMFS History: Patient Active Problem List   Diagnosis Date Noted  . Subacute osteomyelitis, right ankle and foot (HCC)   . Osteomyelitis of great toe of right foot (HCC)   . History of amputation of lesser toe of left foot (HCC) 10/16/2018  . Diabetic infection of left foot (HCC) 10/04/2018  . Diabetic infection of right foot (HCC) 10/04/2018   . Diabetic polyneuropathy associated with type 2 diabetes mellitus (HCC)    Past Medical History:  Diagnosis Date  . Diabetes mellitus without complication (HCC)    Type II  . Infection 10/04/2018   LEFT FOOT  . Osteomyelitis of great toe of left foot (HCC) 07/29/2018  . Subacute osteomyelitis, left ankle and foot (HCC)     Family History  Problem Relation Age of Onset  . Diabetes Mother     Past Surgical History:  Procedure Laterality Date  . AMPUTATION Left 07/31/2018   Procedure: LEFT GREAT TOE AMPUTATION;  Surgeon: [name], MD;  Location: [facility] OR;  Service: Orthopedics;  Laterality: Left;  . AMPUTATION Left 10/09/2018   Procedure: LEFT FOOT 2ND TOE AND POSSIBLE 3RD TOE AMPUTATION;  Surgeon: [name], MD;  Location: [facility] OR;  Service: Orthopedics;  Laterality: Left;  . AMPUTATION Right 03/17/2020   Procedure: RIGHT GREAT TOE AMPUTATION THROUGH METATARSAL;  Surgeon: [name], MD;  Location: [facility] OR;  Service: Orthopedics;  Laterality: Right;  . APPLICATION OF WOUND VAC Right 03/17/2020   Procedure: APPLICATION OF WOUND VAC;  Surgeon: [name], MD;  Location: [facility] OR;  Service: Orthopedics;  Laterality: Right;  . I & D EXTREMITY Right 01/30/2020   Procedure: DEBRIDEMENT ABSCESS RIGHT FOOT;  Surgeon: [name], MD;  Location: [facility] OR;  Service: Orthopedics;  Laterality: Right;  . I & D EXTREMITY Right 04/07/2020   Procedure: PARTIAL TARSAL EXCISION RIGHT FOOT;  Surgeon: [name], MD;  Location: [facility] OR;  Service: Orthopedics;  Laterality: Right;   Social History   Occupational History  . Not on file  Tobacco Use  . Smoking status: Former Smoker    Years: 15.00  . Smokeless tobacco: Never Used  . Tobacco comment: 03/16/20- quit 12- 15 years ago  Vaping Use  . Vaping Use: Never used  Substance and Sexual Activity  . Alcohol use: Yes    Comment: sometimes  . Drug use: Never  . Sexual activity: Not on file

## 2020-08-06 ENCOUNTER — Encounter: Payer: Self-pay | Admitting: Physician Assistant

## 2020-08-06 ENCOUNTER — Ambulatory Visit (INDEPENDENT_AMBULATORY_CARE_PROVIDER_SITE_OTHER): Payer: Self-pay | Admitting: Physician Assistant

## 2020-08-06 DIAGNOSIS — M869 Osteomyelitis, unspecified: Secondary | ICD-10-CM

## 2020-08-06 NOTE — Progress Notes (Signed)
Office Visit Note   Patient: Jon House           Date of Birth: 19-Oct-1970           MRN: [id number] Visit Date: 08/06/2020              Requested by: No referring provider defined for this encounter. PCP: Patient, No Pcp Per (Inactive)  Chief Complaint  Patient presents with  . Right Foot - Follow-up  . Left Foot - Follow-up      HPI: Patient presents today for follow-up on his plantar foot ulcers.  He feels he is doing well and been wearing an offloading shoe.  He has completed antibiotics.  Assessment & Plan: Visit Diagnoses: No diagnosis found.  Plan: Continue offloading.  He does have a lot of skin cracking and bleeding.  I encouraged him to use a cream with cocoa butter on his feet.  We will follow-up in 3 weeks.  Follow-Up Instructions: No follow-ups on file.   Ortho Exam  Patient is alert, oriented, no adenopathy, well-dressed, normal affect, normal respiratory effort. Examination of his foot callus beneath the first MTP is thickly callused but no open areas no drainage no foul odor no ascending cellulitis.  After obtaining verbal consent this was pared down to a soft surface.  Had a touch of bleeding Band-Aid was applied.  On the lateral side of his foot he has a ulcer that is about 1 cm in diameter and at skin surface with healthy granulation tissue after obtaining verbal consent the callus surrounding it was trimmed and silver nitrate stick was applied there is no ascending cellulitis in his foot or his leg  Imaging: No results found. No images are attached to the encounter.  Labs: Lab Results  Component Value Date   HGBA1C 10.2 (A) 07/07/2019   HGBA1C 9.6 (A) 10/02/2018   HGBA1C 11.1 (H) 07/29/2018   ESRSEDRATE 3 07/07/2019   ESRSEDRATE 69 (H) 10/06/2018   ESRSEDRATE 74 (H) 10/05/2018   CRP 13.7 (H) 10/06/2018   CRP 19.7 (H) 10/05/2018   CRP 19.7 (H) 10/04/2018   REPTSTATUS 02/04/2020 FINAL 01/30/2020   GRAMSTAIN  01/30/2020    RARE WBC  PRESENT, PREDOMINANTLY PMN RARE GRAM POSITIVE COCCI    CULT  01/30/2020    FEW ENTEROBACTER CLOACAE FEW ENTEROCOCCUS FAECALIS NO ANAEROBES ISOLATED Performed at [facility], [address], [place], [place] [address]    LABORGA ENTEROBACTER CLOACAE 01/30/2020   LABORGA ENTEROCOCCUS FAECALIS 01/30/2020     Lab Results  Component Value Date   ALBUMIN 3.9 10/03/2018   ALBUMIN 4.7 08/05/2018   ALBUMIN 3.9 07/29/2018   PREALBUMIN 9.6 (L) 10/04/2018    No results found for: MG No results found for: VD25OH  Lab Results  Component Value Date   PREALBUMIN 9.6 (L) 10/04/2018   CBC EXTENDED Latest Ref Rng & Units 01/27/2020 10/10/2018 10/08/2018  WBC 4.0 - 10.5 K/uL 5.8 9.5 5.2  RBC 4.22 - 5.81 MIL/uL 4.70 3.74(L) 4.22  HGB 13.0 - 17.0 g/dL 83.1 11.2(L) 12.5(L)  HCT 39.0 - 52.0 % 41.4 32.7(L) 36.7(L)  PLT 150 - 400 K/uL 343 305 272  NEUTROABS 1.7 - 7.7 K/uL - - -  LYMPHSABS 0.7 - 4.0 K/uL - - -     There is no height or weight on file to calculate BMI.  Orders:  No orders of the defined types were placed in this encounter.  No orders of the defined types were placed in  this encounter.    Procedures: No procedures performed  Clinical Data: No additional findings.  ROS:  All other systems negative, except as noted in the HPI. Review of Systems  Objective: Vital Signs: There were no vitals taken for this visit.  Specialty Comments:  No specialty comments available.  PMFS History: Patient Active Problem List   Diagnosis Date Noted  . Subacute osteomyelitis, right ankle and foot (HCC)   . Osteomyelitis of great toe of right foot (HCC)   . History of amputation of lesser toe of left foot (HCC) 10/16/2018  . Diabetic infection of left foot (HCC) 10/04/2018  . Diabetic infection of right foot (HCC) 10/04/2018  . Diabetic polyneuropathy associated with type 2 diabetes mellitus (HCC)    Past Medical History:  Diagnosis Date  . Diabetes mellitus without  complication (HCC)    Type II  . Infection 10/04/2018   LEFT FOOT  . Osteomyelitis of great toe of left foot (HCC) 07/29/2018  . Subacute osteomyelitis, left ankle and foot (HCC)     Family History  Problem Relation Age of Onset  . Diabetes Mother     Past Surgical History:  Procedure Laterality Date  . AMPUTATION Left 07/31/2018   Procedure: LEFT GREAT TOE AMPUTATION;  Surgeon: [name], MD;  Location: [facility] OR;  Service: Orthopedics;  Laterality: Left;  . AMPUTATION Left 10/09/2018   Procedure: LEFT FOOT 2ND TOE AND POSSIBLE 3RD TOE AMPUTATION;  Surgeon: [name], MD;  Location: [facility] OR;  Service: Orthopedics;  Laterality: Left;  . AMPUTATION Right 03/17/2020   Procedure: RIGHT GREAT TOE AMPUTATION THROUGH METATARSAL;  Surgeon: [name], MD;  Location: [facility] OR;  Service: Orthopedics;  Laterality: Right;  . APPLICATION OF WOUND VAC Right 03/17/2020   Procedure: APPLICATION OF WOUND VAC;  Surgeon: [name], MD;  Location: [facility] OR;  Service: Orthopedics;  Laterality: Right;  . I & D EXTREMITY Right 01/30/2020   Procedure: DEBRIDEMENT ABSCESS RIGHT FOOT;  Surgeon: [name], MD;  Location: [facility] OR;  Service: Orthopedics;  Laterality: Right;  . I & D EXTREMITY Right 04/07/2020   Procedure: PARTIAL TARSAL EXCISION RIGHT FOOT;  Surgeon: [name], MD;  Location: [facility] OR;  Service: Orthopedics;  Laterality: Right;   Social History   Occupational History  . Not on file  Tobacco Use  . Smoking status: Former Smoker    Years: 15.00  . Smokeless tobacco: Never Used  . Tobacco comment: 03/16/20- quit 12- 15 years ago  Vaping Use  . Vaping Use: Never used  Substance and Sexual Activity  . Alcohol use: Yes    Comment: sometimes  . Drug use: Never  . Sexual activity: Not on file

## 2020-08-23 ENCOUNTER — Encounter: Payer: Self-pay | Admitting: Orthopedic Surgery

## 2020-08-23 ENCOUNTER — Ambulatory Visit (INDEPENDENT_AMBULATORY_CARE_PROVIDER_SITE_OTHER): Payer: Self-pay | Admitting: Orthopedic Surgery

## 2020-08-23 VITALS — Ht 69.0 in | Wt 158.0 lb

## 2020-08-23 DIAGNOSIS — L97521 Non-pressure chronic ulcer of other part of left foot limited to breakdown of skin: Secondary | ICD-10-CM

## 2020-08-23 NOTE — Progress Notes (Signed)
Office Visit Note   Patient: Jon House           Date of Birth: 1970/08/21           MRN: [id number] Visit Date: 08/23/2020              Requested by: No referring provider defined for this encounter. PCP: Patient, No Pcp Per (Inactive)  Chief Complaint  Patient presents with  . Right Foot - Follow-up    Bilateral foot ulcer  . Left Foot - Follow-up    Bilateral foot ulcer      HPI: Patient is a 50 year old gentleman who is seen in follow-up for both feet.  He feels like the ulcers on the left foot first metatarsal head and fifth metatarsal head are improving.  Assessment & Plan: Visit Diagnoses:  1. Non-pressure chronic ulcer of other part of left foot limited to breakdown of skin (HCC)     Plan: Ulcers were debrided felt relieving pads were placed under his orthotics x2 plan for follow-up in 4 weeks.  Follow-Up Instructions: Return in about 4 weeks (around 09/20/2020).   Ortho Exam  Patient is alert, oriented, no adenopathy, well-dressed, normal affect, normal respiratory effort. Examination patient's right foot has completely healed there are no ulcers no calluses no signs of infection.  Examination the left foot he has a Wagner grade 1 ulcer beneath the first and fifth metatarsal head these are both 1 cm in diameter and 2 mm deep.  After informed consent a 10 blade knife was used to debride the skin and soft tissue back to healthy viable granulation tissue this was touched with silver nitrate Band-Aids were applied and felt relieving pads were placed under his orthotics.  The first metatarsal head ulcer was 2 cm in diameter and 3 mm deep after debridement and the fifth metatarsal head ulcer was 2 cm in diameter and 3 mm deep after debridement.  Imaging: No results found. No images are attached to the encounter.  Labs: Lab Results  Component Value Date   HGBA1C 10.2 (A) 07/07/2019   HGBA1C 9.6 (A) 10/02/2018   HGBA1C 11.1 (H) 07/29/2018   ESRSEDRATE 3  07/07/2019   ESRSEDRATE 69 (H) 10/06/2018   ESRSEDRATE 74 (H) 10/05/2018   CRP 13.7 (H) 10/06/2018   CRP 19.7 (H) 10/05/2018   CRP 19.7 (H) 10/04/2018   REPTSTATUS 02/04/2020 FINAL 01/30/2020   GRAMSTAIN  01/30/2020    RARE WBC PRESENT, PREDOMINANTLY PMN RARE GRAM POSITIVE COCCI    CULT  01/30/2020    FEW ENTEROBACTER CLOACAE FEW ENTEROCOCCUS FAECALIS NO ANAEROBES ISOLATED Performed at [facility], [address] [place], [place] [address]    LABORGA ENTEROBACTER CLOACAE 01/30/2020   LABORGA ENTEROCOCCUS FAECALIS 01/30/2020     Lab Results  Component Value Date   ALBUMIN 3.9 10/03/2018   ALBUMIN 4.7 08/05/2018   ALBUMIN 3.9 07/29/2018   PREALBUMIN 9.6 (L) 10/04/2018    No results found for: MG No results found for: VD25OH  Lab Results  Component Value Date   PREALBUMIN 9.6 (L) 10/04/2018   CBC EXTENDED Latest Ref Rng & Units 01/27/2020 10/10/2018 10/08/2018  WBC 4.0 - 10.5 K/uL 5.8 9.5 5.2  RBC 4.22 - 5.81 MIL/uL 4.70 3.74(L) 4.22  HGB 13.0 - 17.0 g/dL 84.1 11.2(L) 12.5(L)  HCT 39.0 - 52.0 % 41.4 32.7(L) 36.7(L)  PLT 150 - 400 K/uL 343 305 272  NEUTROABS 1.7 - 7.7 K/uL - - -  LYMPHSABS 0.7 - 4.0 K/uL - - -  Body mass index is 23.33 kg/m.  Orders:  No orders of the defined types were placed in this encounter.  No orders of the defined types were placed in this encounter.    Procedures: No procedures performed  Clinical Data: No additional findings.  ROS:  All other systems negative, except as noted in the HPI. Review of Systems  Objective: Vital Signs: Ht 5\' 9"  (1.753 m)   Wt 158 lb (71.7 kg)   BMI 23.33 kg/m   Specialty Comments:  No specialty comments available.  PMFS History: Patient Active Problem List   Diagnosis Date Noted  . Subacute osteomyelitis, right ankle and foot (HCC)   . Osteomyelitis of great toe of right foot (HCC)   . History of amputation of lesser toe of left foot (HCC) 10/16/2018  . Diabetic infection of  left foot (HCC) 10/04/2018  . Diabetic infection of right foot (HCC) 10/04/2018  . Diabetic polyneuropathy associated with type 2 diabetes mellitus (HCC)    Past Medical History:  Diagnosis Date  . Diabetes mellitus without complication (HCC)    Type II  . Infection 10/04/2018   LEFT FOOT  . Osteomyelitis of great toe of left foot (HCC) 07/29/2018  . Subacute osteomyelitis, left ankle and foot (HCC)     Family History  Problem Relation Age of Onset  . Diabetes Mother     Past Surgical History:  Procedure Laterality Date  . AMPUTATION Left 07/31/2018   Procedure: LEFT GREAT TOE AMPUTATION;  Surgeon: 09/30/2018, MD;  Location[facility] OR;  Service: Orthopedics;  Laterality: Left;  . AMPUTATION Left 10/09/2018   Procedure: LEFT FOOT 2ND TOE AND POSSIBLE 3RD TOE AMPUTATION;  Surgeon: 12/09/2018, MD;  Location: [facility] OR;  Service: Orthopedics;  Laterality: Left;  . AMPUTATION Right 03/17/2020   Procedure: RIGHT GREAT TOE AMPUTATION THROUGH METATARSAL;  Surgeon: 03/19/2020, MD;  Location: [facility] OR;  Service: Orthopedics;  Laterality: Right;  . APPLICATION OF WOUND VAC Right 03/17/2020   Procedure: APPLICATION OF WOUND VAC;  Surgeon: 03/19/2020, MD;  Location: [facility] OR;  Service: Orthopedics;  Laterality: Right;  . I & D EXTREMITY Right 01/30/2020   Procedure: DEBRIDEMENT ABSCESS RIGHT FOOT;  Surgeon: 03/31/2020, MD;  Location: [facility] OR;  Service: Orthopedics;  Laterality: Right;  . I & D EXTREMITY Right 04/07/2020   Procedure: PARTIAL TARSAL EXCISION RIGHT FOOT;  Surgeon: 14/11/2019, MD;  Location: [facility] OR;  Service: Orthopedics;  Laterality: Right;   Social History   Occupational History  . Not on file  Tobacco Use  . Smoking status: Former Smoker    Years: 15.00  . Smokeless tobacco: Never Used  . Tobacco comment: 03/16/20- quit 12- 15 years ago  Vaping Use  . Vaping Use: Never used  Substance and Sexual Activity  . Alcohol use: Yes    Comment: sometimes  . Drug use: Never   . Sexual activity: Not on file

## 2020-08-27 ENCOUNTER — Ambulatory Visit: Payer: Self-pay | Admitting: Physician Assistant

## 2020-09-20 ENCOUNTER — Ambulatory Visit (INDEPENDENT_AMBULATORY_CARE_PROVIDER_SITE_OTHER): Payer: Self-pay | Admitting: Physician Assistant

## 2020-09-20 ENCOUNTER — Encounter: Payer: Self-pay | Admitting: Physician Assistant

## 2020-09-20 DIAGNOSIS — L97521 Non-pressure chronic ulcer of other part of left foot limited to breakdown of skin: Secondary | ICD-10-CM

## 2020-09-20 NOTE — Progress Notes (Signed)
Office Visit Note   Patient: Jon House           Date of Birth: 05-Jan-1971           MRN: [id number] Visit Date: 09/20/2020              Requested by: No referring provider defined for this encounter. PCP: Patient, No Pcp Per (Inactive)  Chief Complaint  Patient presents with  . Right Foot - Follow-up  . Left Foot - Follow-up      HPI: Patient presents in follow-up today for his feet.  On his right foot he is status post ray amputation.  He feels some fullness on the bottom of the foot but its not painful.  He also has been adjusting his shoe with some padding so his foot does not collapse medially.  He finds this more comfortable.  On the left foot he has ulcers beneath the first and fifth metatarsal heads.  These were last trimmed up a month ago. Assessment & Plan: Visit Diagnoses: No diagnosis found.  Plan told him the importance of making sure he continues to use pads especially under the fifth metatarsal head.  He said he took them out to clean the shoes but will put them back in.  Follow-up in 1 month or sooner if any concerns  Follow-Up Instructions: No follow-ups on file.   Ortho Exam  Patient is alert, oriented, no adenopathy, well-dressed, normal affect, normal respiratory effort. Right foot well-healed surgical incision no erythema no cellulitis no fluctuance with palpation no signs of acute infection.  On the left side he has a ulcer underneath the first metatarsal head.  This is callused and is now almost completely healed without any ulceration deep.  After verbal consent was contained this was trimmed to a healthy surface.  He did not have any surrounding cellulitis.  He also has a 2 cm ulcer that is 1 2 mm deep on the fifth metatarsal head.  There is some fibrinous tissue at the base and surrounding callus.  No surrounding erythema or cellulitis.  This does not tunnel or probe to bone.  After obtaining verbal consent this was trimmed to a healthy surface  afterwards it was only 1 mm deep  Imaging: No results found. No images are attached to the encounter.  Labs: Lab Results  Component Value Date   HGBA1C 10.2 (A) 07/07/2019   HGBA1C 9.6 (A) 10/02/2018   HGBA1C 11.1 (H) 07/29/2018   ESRSEDRATE 3 07/07/2019   ESRSEDRATE 69 (H) 10/06/2018   ESRSEDRATE 74 (H) 10/05/2018   CRP 13.7 (H) 10/06/2018   CRP 19.7 (H) 10/05/2018   CRP 19.7 (H) 10/04/2018   REPTSTATUS 02/04/2020 FINAL 01/30/2020   GRAMSTAIN  01/30/2020    RARE WBC PRESENT, PREDOMINANTLY PMN RARE GRAM POSITIVE COCCI    CULT  01/30/2020    FEW ENTEROBACTER CLOACAE FEW ENTEROCOCCUS FAECALIS NO ANAEROBES ISOLATED Performed at [facility], [address], [place], [place] [address]    LABORGA ENTEROBACTER CLOACAE 01/30/2020   LABORGA ENTEROCOCCUS FAECALIS 01/30/2020     Lab Results  Component Value Date   ALBUMIN 3.9 10/03/2018   ALBUMIN 4.7 08/05/2018   ALBUMIN 3.9 07/29/2018   PREALBUMIN 9.6 (L) 10/04/2018    No results found for: MG No results found for: VD25OH  Lab Results  Component Value Date   PREALBUMIN 9.6 (L) 10/04/2018   CBC EXTENDED Latest Ref Rng & Units 01/27/2020 10/10/2018 10/08/2018  WBC 4.0 - 10.5 K/uL  5.8 9.5 5.2  RBC 4.22 - 5.81 MIL/uL 4.70 3.74(L) 4.22  HGB 13.0 - 17.0 g/dL 29.5 11.2(L) 12.5(L)  HCT 39.0 - 52.0 % 41.4 32.7(L) 36.7(L)  PLT 150 - 400 K/uL 343 305 272  NEUTROABS 1.7 - 7.7 K/uL - - -  LYMPHSABS 0.7 - 4.0 K/uL - - -     There is no height or weight on file to calculate BMI.  Orders:  No orders of the defined types were placed in this encounter.  No orders of the defined types were placed in this encounter.    Procedures: No procedures performed  Clinical Data: No additional findings.  ROS:  All other systems negative, except as noted in the HPI. Review of Systems  Objective: Vital Signs: There were no vitals taken for this visit.  Specialty Comments:  No specialty comments available.  PMFS  History: Patient Active Problem List   Diagnosis Date Noted  . Subacute osteomyelitis, right ankle and foot (HCC)   . Osteomyelitis of great toe of right foot (HCC)   . History of amputation of lesser toe of left foot (HCC) 10/16/2018  . Diabetic infection of left foot (HCC) 10/04/2018  . Diabetic infection of right foot (HCC) 10/04/2018  . Diabetic polyneuropathy associated with type 2 diabetes mellitus (HCC)    Past Medical History:  Diagnosis Date  . Diabetes mellitus without complication (HCC)    Type II  . Infection 10/04/2018   LEFT FOOT  . Osteomyelitis of great toe of left foot (HCC) 07/29/2018  . Subacute osteomyelitis, left ankle and foot (HCC)     Family History  Problem Relation Age of Onset  . Diabetes Mother     Past Surgical History:  Procedure Laterality Date  . AMPUTATION Left 07/31/2018   Procedure: LEFT GREAT TOE AMPUTATION;  Surgeon: [name], MD;  Location: [facility] OR;  Service: Orthopedics;  Laterality: Left;  . AMPUTATION Left 10/09/2018   Procedure: LEFT FOOT 2ND TOE AND POSSIBLE 3RD TOE AMPUTATION;  Surgeon: [name], MD;  Location: [facility] OR;  Service: Orthopedics;  Laterality: Left;  . AMPUTATION Right 03/17/2020   Procedure: RIGHT GREAT TOE AMPUTATION THROUGH METATARSAL;  Surgeon: [name], MD;  Location: [facility] OR;  Service: Orthopedics;  Laterality: Right;  . APPLICATION OF WOUND VAC Right 03/17/2020   Procedure: APPLICATION OF WOUND VAC;  Surgeon: [name], MD;  Location: [facility] OR;  Service: Orthopedics;  Laterality: Right;  . I & D EXTREMITY Right 01/30/2020   Procedure: DEBRIDEMENT ABSCESS RIGHT FOOT;  Surgeon: [name], MD;  Location: [facility] OR;  Service: Orthopedics;  Laterality: Right;  . I & D EXTREMITY Right 04/07/2020   Procedure: PARTIAL TARSAL EXCISION RIGHT FOOT;  Surgeon: [name], MD;  Location: [facility] OR;  Service: Orthopedics;  Laterality: Right;   Social History   Occupational History  . Not on file  Tobacco Use  .  Smoking status: Former Smoker    Years: 15.00  . Smokeless tobacco: Never Used  . Tobacco comment: 03/16/20- quit 12- 15 years ago  Vaping Use  . Vaping Use: Never used  Substance and Sexual Activity  . Alcohol use: Yes    Comment: sometimes  . Drug use: Never  . Sexual activity: Not on file

## 2020-10-04 ENCOUNTER — Telehealth: Payer: Self-pay | Admitting: Orthopedic Surgery

## 2020-10-04 ENCOUNTER — Other Ambulatory Visit: Payer: Self-pay

## 2020-10-04 NOTE — Telephone Encounter (Signed)
Jon House  that would be great! Please call pt and advise note is ready for pick up.  Letter written to advise that the pt has been out of work since 01/28/20 to date. He has had several surgeries including 01/2020 de right foot, 03/17/20 right GT amp, 04/07/20 partial tarsal excision. He is seen in the office routinely and has been out of work during this time.

## 2020-10-04 NOTE — Telephone Encounter (Signed)
PT daughter called asking if you could write him a note about him being out from his first surgery to now. They will come pick it up this afternoon if possible. They would like a call when note is ready. Let me know and I will call if you would like!   CB [phone]

## 2020-10-18 ENCOUNTER — Ambulatory Visit (INDEPENDENT_AMBULATORY_CARE_PROVIDER_SITE_OTHER): Payer: Self-pay | Admitting: Orthopedic Surgery

## 2020-10-18 ENCOUNTER — Encounter: Payer: Self-pay | Admitting: Orthopedic Surgery

## 2020-10-18 DIAGNOSIS — M86272 Subacute osteomyelitis, left ankle and foot: Secondary | ICD-10-CM

## 2020-10-18 NOTE — Progress Notes (Signed)
Office Visit Note   Patient: Jon House           Date of Birth: [birth year]           MRN: [id number] Visit Date: 10/18/2020              Requested by: No referring provider defined for this encounter. PCP: Patient, No Pcp Per (Inactive)  Chief Complaint  Patient presents with   Right Foot - Follow-up    03/2020 partial tarsal excision right foot       HPI: Patient is a 50 year old gentleman who is status post partial partial excision on the right he presents at this time stating that his left foot is getting worse and he has ulceration beneath the first and fifth metatarsal heads.  Patient states that his blood sugars have been running well in the 100-1 20 range.  Assessment & Plan: Visit Diagnoses:  1. Subacute osteomyelitis, left ankle and foot (HCC)     Plan: With the ulcer of the first metatarsal head and osteomyelitis of the fifth metatarsal head status post previous great toe and second toe amputation I have discussed the patient's best option is to proceed with a transmetatarsal amputation the left.  Risk and benefits were discussed including risk of the wound not healing.  Through an interpreter patient states he understands wished to proceed at this time we will set this up for Friday as an outpatient.  The importance of nonweightbearing postoperatively was discussed.  Follow-Up Instructions: Return in about 2 weeks (around 11/01/2020).   Ortho Exam  Patient is alert, oriented, no adenopathy, well-dressed, normal affect, normal respiratory effort. Examination patient has good hair growth on the left foot he has a palpable dorsalis pedis pulse he is status post a great toe and second toe amputation he has a Wagner grade 3 ulcer beneath the fifth metatarsal head of the left foot after informed consent a 10 blade knife was used to debride the skin and soft tissue back to healthy viable granulation tissue this was touched with silver nitrate the ulcer probes 1 cm to  bone with osteomyelitis of the fifth metatarsal head.  Patient also has an extensive ulcer beneath the first metatarsal head which was also pared with no exposed bone.  Examination of the patient has a ulcer beneath the fifth metatarsal head.  This is 5 mm in diameter.  After informed consent a 10 blade knife was used debride the skin and soft tissue back to healthy viable granulation tissue after debridement the ulcer is 2 cm in diameter 1 cm deep and probes to bone.  Silver nitrate was used for hemostasis.  Imaging: No results found. No images are attached to the encounter.  Labs: Lab Results  Component Value Date   HGBA1C 10.2 (A) 07/07/2019   HGBA1C 9.6 (A) 10/02/2018   HGBA1C 11.1 (H) 07/29/2018   ESRSEDRATE 3 07/07/2019   ESRSEDRATE 69 (H) 10/06/2018   ESRSEDRATE 74 (H) 10/05/2018   CRP 13.7 (H) 10/06/2018   CRP 19.7 (H) 10/05/2018   CRP 19.7 (H) 10/04/2018   REPTSTATUS 02/04/2020 FINAL 01/30/2020   GRAMSTAIN  01/30/2020    RARE WBC PRESENT, PREDOMINANTLY PMN RARE GRAM POSITIVE COCCI    CULT  01/30/2020    FEW ENTEROBACTER CLOACAE FEW ENTEROCOCCUS FAECALIS NO ANAEROBES ISOLATED Performed at [facility], [address], [place], [place] [address]    LABORGA ENTEROBACTER CLOACAE 01/30/2020   LABORGA ENTEROCOCCUS FAECALIS 01/30/2020     Lab Results  Component Value Date   ALBUMIN 3.9 10/03/2018   ALBUMIN 4.7 08/05/2018   ALBUMIN 3.9 07/29/2018   PREALBUMIN 9.6 (L) 10/04/2018    No results found for: MG No results found for: VD25OH  Lab Results  Component Value Date   PREALBUMIN 9.6 (L) 10/04/2018   CBC EXTENDED Latest Ref Rng & Units 01/27/2020 10/10/2018 10/08/2018  WBC 4.0 - 10.5 K/uL 5.8 9.5 5.2  RBC 4.22 - 5.81 MIL/uL 4.70 3.74(L) 4.22  HGB 13.0 - 17.0 g/dL 25.0 11.2(L) 12.5(L)  HCT 39.0 - 52.0 % 41.4 32.7(L) 36.7(L)  PLT 150 - 400 K/uL 343 305 272  NEUTROABS 1.7 - 7.7 K/uL - - -  LYMPHSABS 0.7 - 4.0 K/uL - - -     There is no height or weight  on file to calculate BMI.  Orders:  No orders of the defined types were placed in this encounter.  No orders of the defined types were placed in this encounter.    Procedures: No procedures performed  Clinical Data: No additional findings.  ROS:  All other systems negative, except as noted in the HPI. Review of Systems  Objective: Vital Signs: There were no vitals taken for this visit.  Specialty Comments:  No specialty comments available.  PMFS History: Patient Active Problem List   Diagnosis Date Noted   Subacute osteomyelitis, right ankle and foot (HCC)    Osteomyelitis of great toe of right foot (HCC)    History of amputation of lesser toe of left foot (HCC) 10/16/2018   Diabetic infection of left foot (HCC) 10/04/2018   Diabetic infection of right foot (HCC) 10/04/2018   Diabetic polyneuropathy associated with type 2 diabetes mellitus (HCC)    Past Medical History:  Diagnosis Date   Diabetes mellitus without complication (HCC)    Type II   Infection 10/04/2018   LEFT FOOT   Osteomyelitis of great toe of left foot (HCC) 07/29/2018   Subacute osteomyelitis, left ankle and foot (HCC)     Family History  Problem Relation Age of Onset   Diabetes Mother     Past Surgical History:  Procedure Laterality Date   AMPUTATION Left 07/31/2018   Procedure: LEFT GREAT TOE AMPUTATION;  Surgeon: [name], MD;  Location: [facility] OR;  Service: Orthopedics;  Laterality: Left;   AMPUTATION Left 10/09/2018   Procedure: LEFT FOOT 2ND TOE AND POSSIBLE 3RD TOE AMPUTATION;  Surgeon: [name], MD;  Location: [facility] OR;  Service: Orthopedics;  Laterality: Left;   AMPUTATION Right 03/17/2020   Procedure: RIGHT GREAT TOE AMPUTATION THROUGH METATARSAL;  Surgeon: [name], MD;  Location: [facility] OR;  Service: Orthopedics;  Laterality: Right;   APPLICATION OF WOUND VAC Right 03/17/2020   Procedure: APPLICATION OF WOUND VAC;  Surgeon: [name], MD;  Location: [facility] OR;  Service:  Orthopedics;  Laterality: Right;   I & D EXTREMITY Right 01/30/2020   Procedure: DEBRIDEMENT ABSCESS RIGHT FOOT;  Surgeon: [name], MD;  Location: [facility] OR;  Service: Orthopedics;  Laterality: Right;   I & D EXTREMITY Right 04/07/2020   Procedure: PARTIAL TARSAL EXCISION RIGHT FOOT;  Surgeon: [name], MD;  Location: [facility] OR;  Service: Orthopedics;  Laterality: Right;   Social History   Occupational History   Not on file  Tobacco Use   Smoking status: Former    Years: 15.00    Pack years: 0.00    Types: Cigarettes   Smokeless tobacco: Never   Tobacco comments:  03/16/20- quit 12- 15 years ago  Vaping Use   Vaping Use: Never used  Substance and Sexual Activity   Alcohol use: Yes    Comment: sometimes   Drug use: Never   Sexual activity: Not on file

## 2020-10-19 ENCOUNTER — Other Ambulatory Visit: Payer: Self-pay | Admitting: Physician Assistant

## 2020-10-21 ENCOUNTER — Other Ambulatory Visit: Payer: Self-pay

## 2020-10-21 ENCOUNTER — Encounter (HOSPITAL_COMMUNITY): Payer: Self-pay | Admitting: Orthopedic Surgery

## 2020-10-21 NOTE — Anesthesia Preprocedure Evaluation (Addendum)
Anesthesia Evaluation  Patient identified by MRN, date of birth, ID band Patient awake    Reviewed: Allergy & Precautions, NPO status , Patient's Chart, lab work & pertinent test results  History of Anesthesia Complications Negative for: history of anesthetic complications  Airway Mallampati: II  TM Distance: >3 FB Neck ROM: Full    Dental  (+) Teeth Intact   Pulmonary neg pulmonary ROS, former smoker,    Pulmonary exam normal        Cardiovascular negative cardio ROS Normal cardiovascular exam     Neuro/Psych negative neurological ROS     GI/Hepatic negative GI ROS, Neg liver ROS,   Endo/Other  diabetes, Poorly Controlled, Insulin Dependent  Renal/GU negative Renal ROS  negative genitourinary   Musculoskeletal negative musculoskeletal ROS (+) L foot osteomyelitis   Abdominal   Peds  Hematology negative hematology ROS (+)   Anesthesia Other Findings   Reproductive/Obstetrics                            Anesthesia Physical Anesthesia Plan  ASA: 3  Anesthesia Plan: General   Post-op Pain Management:    Induction: Intravenous  PONV Risk Score and Plan: 2 and Ondansetron, Dexamethasone, Midazolam and Treatment may vary due to age or medical condition  Airway Management Planned: LMA  Additional Equipment: None  Intra-op Plan:   Post-operative Plan: Extubation in OR  Informed Consent: I have reviewed the patients History and Physical, chart, labs and discussed the procedure including the risks, benefits and alternatives for the proposed anesthesia with the patient or authorized representative who has indicated his/her understanding and acceptance.     Dental advisory given  Plan Discussed with:   Anesthesia Plan Comments:        Anesthesia Quick Evaluation

## 2020-10-21 NOTE — Progress Notes (Signed)
Mr. Jon House denies chest pain or shortness of breath. Patient denies any s/s of Covid in her home and is unaware of any exposures.   Mr. Jon House has type II diabetes, patient reports that he checks CBG runs 100- 110. I instructed Mr. Jon Kocher - House  to take 70 % of Humolog 70/30 this afternoon- if you should take 15 units that 10, if you should take 15 units take 14 units. Do not take Insulin in am. I instructed patient to check CBG after awaking and every 2 hours until arrival  to the hospital.  I Instructed patient if CBG is less than 70 to take 4 Glucose Tablets or 1 tube of Glucose Gel or 1/2 cup of a clear juice. Recheck CBG in 15 minutes if CBG is not over 70 call, pre- op desk at [phone] for further instructions.  Mr. Jon House does not have a PCP,. He states that he has not seen any DR except Lajoyce Corners in 5 years.

## 2020-10-22 ENCOUNTER — Ambulatory Visit (HOSPITAL_COMMUNITY)
Admission: RE | Admit: 2020-10-22 | Discharge: 2020-10-22 | Disposition: A | Discharge status: Home / Self Care | Payer: Self-pay | Attending: Orthopedic Surgery | Admitting: Orthopedic Surgery

## 2020-10-22 ENCOUNTER — Ambulatory Visit (HOSPITAL_COMMUNITY): Payer: Self-pay | Admitting: Anesthesiology

## 2020-10-22 ENCOUNTER — Encounter (HOSPITAL_COMMUNITY): Payer: Self-pay | Admitting: Orthopedic Surgery

## 2020-10-22 ENCOUNTER — Encounter (HOSPITAL_COMMUNITY)
Admission: RE | Disposition: A | Discharge status: Home / Self Care | Payer: Self-pay | Source: Home / Self Care | Attending: Orthopedic Surgery

## 2020-10-22 DIAGNOSIS — M86271 Subacute osteomyelitis, right ankle and foot: Secondary | ICD-10-CM

## 2020-10-22 DIAGNOSIS — Z888 Allergy status to other drugs, medicaments and biological substances status: Secondary | ICD-10-CM | POA: Insufficient documentation

## 2020-10-22 DIAGNOSIS — Z87891 Personal history of nicotine dependence: Secondary | ICD-10-CM | POA: Insufficient documentation

## 2020-10-22 DIAGNOSIS — L97526 Non-pressure chronic ulcer of other part of left foot with bone involvement without evidence of necrosis: Secondary | ICD-10-CM | POA: Insufficient documentation

## 2020-10-22 DIAGNOSIS — Z91030 Bee allergy status: Secondary | ICD-10-CM | POA: Insufficient documentation

## 2020-10-22 DIAGNOSIS — M86272 Subacute osteomyelitis, left ankle and foot: Secondary | ICD-10-CM | POA: Insufficient documentation

## 2020-10-22 DIAGNOSIS — Z89412 Acquired absence of left great toe: Secondary | ICD-10-CM | POA: Insufficient documentation

## 2020-10-22 DIAGNOSIS — E11621 Type 2 diabetes mellitus with foot ulcer: Secondary | ICD-10-CM | POA: Insufficient documentation

## 2020-10-22 DIAGNOSIS — E1169 Type 2 diabetes mellitus with other specified complication: Secondary | ICD-10-CM | POA: Insufficient documentation

## 2020-10-22 DIAGNOSIS — Z89422 Acquired absence of other left toe(s): Secondary | ICD-10-CM | POA: Insufficient documentation

## 2020-10-22 DIAGNOSIS — Z89411 Acquired absence of right great toe: Secondary | ICD-10-CM | POA: Insufficient documentation

## 2020-10-22 DIAGNOSIS — Z794 Long term (current) use of insulin: Secondary | ICD-10-CM | POA: Insufficient documentation

## 2020-10-22 HISTORY — PX: AMPUTATION: SHX166

## 2020-10-22 LAB — BASIC METABOLIC PANEL
Anion gap: 7 (ref 5–15)
BUN: 25 mg/dL — ABNORMAL HIGH (ref 6–20)
CO2: 29 mmol/L (ref 22–32)
Calcium: 9.4 mg/dL (ref 8.9–10.3)
Chloride: 97 mmol/L — ABNORMAL LOW (ref 98–111)
Creatinine, Ser: 0.69 mg/dL (ref 0.61–1.24)
GFR, Estimated: >60 mL/min (ref >60)
Glucose, Bld: 251 mg/dL — ABNORMAL HIGH (ref 70–99)
Potassium: 3.7 mmol/L (ref 3.5–5.1)
Sodium: 133 mmol/L — ABNORMAL LOW (ref 135–145)

## 2020-10-22 LAB — GLUCOSE, CAPILLARY
Glucose-Capillary: 244 mg/dL — ABNORMAL HIGH (ref 70–99)
Glucose-Capillary: 252 mg/dL — ABNORMAL HIGH (ref 70–99)

## 2020-10-22 LAB — CBC
HCT: 43.2 % (ref 39.0–52.0)
Hemoglobin: 14.6 g/dL (ref 13.0–17.0)
MCH: 29.1 pg (ref 26.0–34.0)
MCHC: 33.8 g/dL (ref 30.0–36.0)
MCV: 86.2 fL (ref 80.0–100.0)
Platelets: 216 10*3/uL (ref 150–400)
RBC: 5.01 MIL/uL (ref 4.22–5.81)
RDW: 13.7 % (ref 11.5–15.5)
WBC: 7.3 10*3/uL (ref 4.0–10.5)
nRBC: 0 % (ref 0.0–0.2)

## 2020-10-22 SURGERY — AMPUTATION, FOOT, RAY
Anesthesia: General | Laterality: Left

## 2020-10-22 MED ORDER — SUCCINYLCHOLINE CHLORIDE 200 MG/10ML IV SOSY
PREFILLED_SYRINGE | INTRAVENOUS | Status: AC
  Filled 2020-10-22: qty 10

## 2020-10-22 MED ORDER — FENTANYL CITRATE (PF) 250 MCG/5ML IJ SOLN
INTRAMUSCULAR | Status: AC
  Filled 2020-10-22: qty 5

## 2020-10-22 MED ORDER — PROPOFOL 10 MG/ML IV BOLUS
INTRAVENOUS | Status: AC
  Filled 2020-10-22: qty 40

## 2020-10-22 MED ORDER — ONDANSETRON HCL 4 MG/2ML IJ SOLN
INTRAMUSCULAR | Status: AC
  Filled 2020-10-22: qty 2

## 2020-10-22 MED ORDER — DEXAMETHASONE SODIUM PHOSPHATE 10 MG/ML IJ SOLN
INTRAMUSCULAR | Status: AC
  Filled 2020-10-22: qty 1

## 2020-10-22 MED ORDER — LIDOCAINE 2% (20 MG/ML) 5 ML SYRINGE
INTRAMUSCULAR | Status: DC | PRN
  Administered 2020-10-22: 100 mg via INTRAVENOUS

## 2020-10-22 MED ORDER — FENTANYL CITRATE (PF) 100 MCG/2ML IJ SOLN
INTRAMUSCULAR | Status: DC | PRN
  Administered 2020-10-22: 25 ug via INTRAVENOUS

## 2020-10-22 MED ORDER — CEFAZOLIN SODIUM-DEXTROSE 2-4 GM/100ML-% IV SOLN
INTRAVENOUS | Status: AC
  Filled 2020-10-22: qty 100

## 2020-10-22 MED ORDER — LIDOCAINE 2% (20 MG/ML) 5 ML SYRINGE
INTRAMUSCULAR | Status: AC
  Filled 2020-10-22: qty 5

## 2020-10-22 MED ORDER — ROCURONIUM BROMIDE 10 MG/ML (PF) SYRINGE
PREFILLED_SYRINGE | INTRAVENOUS | Status: AC
  Filled 2020-10-22: qty 10

## 2020-10-22 MED ORDER — ONDANSETRON HCL 4 MG/2ML IJ SOLN: 4.0000 mg | Freq: Once | INTRAMUSCULAR | Status: DC | PRN

## 2020-10-22 MED ORDER — AMISULPRIDE (ANTIEMETIC) 5 MG/2ML IV SOLN: 10.0000 mg | Freq: Once | INTRAVENOUS | Status: DC | PRN

## 2020-10-22 MED ORDER — ONDANSETRON HCL 4 MG/2ML IJ SOLN
INTRAMUSCULAR | Status: DC | PRN
  Administered 2020-10-22: 4 mg via INTRAVENOUS

## 2020-10-22 MED ORDER — PHENYLEPHRINE 40 MCG/ML (10ML) SYRINGE FOR IV PUSH (FOR BLOOD PRESSURE SUPPORT)
PREFILLED_SYRINGE | INTRAVENOUS | Status: AC
  Filled 2020-10-22: qty 10

## 2020-10-22 MED ORDER — EPHEDRINE SULFATE-NACL 50-0.9 MG/10ML-% IV SOSY
PREFILLED_SYRINGE | INTRAVENOUS | Status: DC | PRN
  Administered 2020-10-22 (×3): 5 mg via INTRAVENOUS

## 2020-10-22 MED ORDER — LACTATED RINGERS IV SOLN: INTRAVENOUS | Status: DC

## 2020-10-22 MED ORDER — PROPOFOL 10 MG/ML IV BOLUS
INTRAVENOUS | Status: DC | PRN
  Administered 2020-10-22: 200 mg via INTRAVENOUS

## 2020-10-22 MED ORDER — OXYCODONE HCL 5 MG/5ML PO SOLN: 5.0000 mg | Freq: Once | ORAL | Status: DC | PRN

## 2020-10-22 MED ORDER — OXYCODONE HCL 5 MG PO TABS: 5.0000 mg | ORAL_TABLET | Freq: Once | ORAL | Status: DC | PRN

## 2020-10-22 MED ORDER — 0.9 % SODIUM CHLORIDE (POUR BTL) OPTIME
TOPICAL | Status: DC | PRN
  Administered 2020-10-22: 1000 mL

## 2020-10-22 MED ORDER — MIDAZOLAM HCL 2 MG/2ML IJ SOLN
INTRAMUSCULAR | Status: AC
  Filled 2020-10-22: qty 2

## 2020-10-22 MED ORDER — ORAL CARE MOUTH RINSE: 15.0000 mL | Freq: Once | OROMUCOSAL | Status: AC

## 2020-10-22 MED ORDER — PHENYLEPHRINE 40 MCG/ML (10ML) SYRINGE FOR IV PUSH (FOR BLOOD PRESSURE SUPPORT)
PREFILLED_SYRINGE | INTRAVENOUS | Status: DC | PRN
  Administered 2020-10-22: 160 ug via INTRAVENOUS
  Administered 2020-10-22 (×2): 120 ug via INTRAVENOUS

## 2020-10-22 MED ORDER — CEFAZOLIN SODIUM-DEXTROSE 2-4 GM/100ML-% IV SOLN
2.0000 g | INTRAVENOUS | Status: AC
  Administered 2020-10-22: 2 g via INTRAVENOUS

## 2020-10-22 MED ORDER — OXYCODONE-ACETAMINOPHEN 5-325 MG PO TABS: 1.0000 | ORAL_TABLET | ORAL | 0 refills | Status: DC | PRN

## 2020-10-22 MED ORDER — MIDAZOLAM HCL 5 MG/5ML IJ SOLN
INTRAMUSCULAR | Status: DC | PRN
  Administered 2020-10-22: 2 mg via INTRAVENOUS

## 2020-10-22 MED ORDER — HYDROMORPHONE HCL 1 MG/ML IJ SOLN: 0.2500 mg | INTRAMUSCULAR | Status: DC | PRN

## 2020-10-22 MED ORDER — CHLORHEXIDINE GLUCONATE 0.12 % MT SOLN: 15.0000 mL | Freq: Once | OROMUCOSAL | Status: AC

## 2020-10-22 MED ORDER — EPHEDRINE 5 MG/ML INJ
INTRAVENOUS | Status: AC
  Filled 2020-10-22: qty 10

## 2020-10-22 MED ORDER — CHLORHEXIDINE GLUCONATE 0.12 % MT SOLN
OROMUCOSAL | Status: AC
  Administered 2020-10-22: 15 mL via OROMUCOSAL
  Filled 2020-10-22: qty 15

## 2020-10-22 SURGICAL SUPPLY — 28 items
BLADE SAW RECIP 87.9 MT (BLADE) ×3 IMPLANT
BLADE SURG 21 STRL SS (BLADE) ×3 IMPLANT
BNDG COHESIVE 4X5 TAN STRL (GAUZE/BANDAGES/DRESSINGS) ×3 IMPLANT
BNDG GAUZE ELAST 4 BULKY (GAUZE/BANDAGES/DRESSINGS) ×3 IMPLANT
COVER SURGICAL LIGHT HANDLE (MISCELLANEOUS) ×3 IMPLANT
COVER WAND RF STERILE (DRAPES) ×3 IMPLANT
DRAPE U-SHAPE 47X51 STRL (DRAPES) ×6 IMPLANT
DRSG ADAPTIC 3X8 NADH LF (GAUZE/BANDAGES/DRESSINGS) ×3 IMPLANT
DRSG PAD ABDOMINAL 8X10 ST (GAUZE/BANDAGES/DRESSINGS) ×6 IMPLANT
DURAPREP 26ML APPLICATOR (WOUND CARE) ×3 IMPLANT
ELECT REM PT RETURN 9FT ADLT (ELECTROSURGICAL) ×3
ELECTRODE REM PT RTRN 9FT ADLT (ELECTROSURGICAL) ×1 IMPLANT
GAUZE SPONGE 4X4 12PLY STRL (GAUZE/BANDAGES/DRESSINGS) ×3 IMPLANT
GLOVE SURG ORTHO LTX SZ9 (GLOVE) ×3 IMPLANT
GLOVE SURG UNDER POLY LF SZ9 (GLOVE) ×3 IMPLANT
GOWN STRL REUS W/ TWL XL LVL3 (GOWN DISPOSABLE) ×2 IMPLANT
GOWN STRL REUS W/TWL XL LVL3 (GOWN DISPOSABLE) ×6
KIT BASIN OR (CUSTOM PROCEDURE TRAY) ×3 IMPLANT
KIT TURNOVER KIT B (KITS) ×3 IMPLANT
NS IRRIG 1000ML POUR BTL (IV SOLUTION) ×3 IMPLANT
PACK ORTHO EXTREMITY (CUSTOM PROCEDURE TRAY) ×3 IMPLANT
PAD ABD 8X10 STRL (GAUZE/BANDAGES/DRESSINGS) ×3 IMPLANT
PAD ARMBOARD 7.5X6 YLW CONV (MISCELLANEOUS) ×6 IMPLANT
SUT ETHILON 2 0 PSLX (SUTURE) ×6 IMPLANT
TOWEL GREEN STERILE (TOWEL DISPOSABLE) ×3 IMPLANT
TUBE CONNECTING 12'X1/4 (SUCTIONS) ×1
TUBE CONNECTING 12X1/4 (SUCTIONS) ×2 IMPLANT
YANKAUER SUCT BULB TIP NO VENT (SUCTIONS) ×3 IMPLANT

## 2020-10-22 NOTE — Transfer of Care (Signed)
Immediate Anesthesia Transfer of Care Note  Patient: Jon House  Procedure(s) Performed: LEFT TRANSMETATARSAL AMPUTATION (Left)  Patient Location: PACU  Anesthesia Type:General  Level of Consciousness: drowsy and patient cooperative  Airway & Oxygen Therapy: Patient Spontanous Breathing and Patient connected to nasal cannula oxygen  Post-op Assessment: Report given to RN and Post -op Vital signs reviewed and stable  Post vital signs: Reviewed and stable  Last Vitals:  Vitals Value Taken Time  BP 155/85 10/22/20 0809  Temp    Pulse 106 10/22/20 0810  Resp 14 10/22/20 0810  SpO2 100 % 10/22/20 0810  Vitals shown include unvalidated device data.  Last Pain:  Vitals:   10/22/20 0607  TempSrc:   PainSc: 0-No pain         Complications: No notable events documented.

## 2020-10-22 NOTE — Progress Notes (Signed)
Orthopedic Tech Progress Note Patient Details:  Jon House 1971-04-23 [id number]  Ortho Devices Type of Ortho Device: Postop shoe/boot Ortho Device/Splint Location: LLE Ortho Device/Splint Interventions: Ordered      Bella Kennedy A Virna Livengood 10/22/2020, 9:17 AM

## 2020-10-22 NOTE — Op Note (Signed)
10/22/2020  8:18 AM  PATIENT:  Jon House    PRE-OPERATIVE DIAGNOSIS:  osteomyelitis left foot  POST-OPERATIVE DIAGNOSIS:  Same  PROCEDURE:  LEFT TRANSMETATARSAL AMPUTATION  SURGEON:  [name], MD  PHYSICIAN ASSISTANT:None ANESTHESIA:   General  PREOPERATIVE INDICATIONS:  Jon House is a  50 y.o. male with a diagnosis of osteomyelitis left foot who failed conservative measures and elected for surgical management.    The risks benefits and alternatives were discussed with the patient preoperatively including but not limited to the risks of infection, bleeding, nerve injury, cardiopulmonary complications, the need for revision surgery, among others, and the patient was willing to proceed.  OPERATIVE IMPLANTS: none  @ENCIMAGES @  OPERATIVE FINDINGS: Wound margins healthy and viable with good petechial bleed.  OPERATIVE PROCEDURE: Patient was brought the operating room and underwent a general anesthetic.  After adequate levels anesthesia were obtained patient's left lower extremity was prepped using DuraPrep draped into a sterile field a timeout was called.  A fishmouth incision was made just proximal to the abscess ulcers.  This was carried sharply down to bone.  A reciprocating saw was used to perform a transmetatarsal amputation.  Electrocautery was used hemostasis there was good petechial bleeding the wound margins were clear no signs of infection.  The wound was irrigated with normal saline electrocardio was used hemostasis the incision was closed using 2-0 nylon and a sterile dressing was applied.   DISCHARGE PLANNING:  Antibiotic duration: Preoperative antibiotics  Weightbearing: Nonweightbearing on the left  Pain medication: Prescription for Percocet  Dressing care/ Wound VAC: Dry dressing  Ambulatory devices: Crutches  Discharge to: Home.  Follow-up: In the office 1 week post operative.

## 2020-10-22 NOTE — Interval H&P Note (Signed)
History and Physical Interval Note:  10/22/2020 7:21 AM  Jon House  has presented today for surgery, with the diagnosis of osteomyelitis left foot.  The various methods of treatment have been discussed with the patient and family. After consideration of risks, benefits and other options for treatment, the patient has consented to  Procedure(s): LEFT TRANSMETATARSAL AMPUTATION (Left) as a surgical intervention.  The patient's history has been reviewed, patient examined, no change in status, stable for surgery.  I have reviewed the patient's chart and labs.  Questions were answered to the patient's satisfaction.     [name]

## 2020-10-22 NOTE — Anesthesia Procedure Notes (Signed)
Procedure Name: LMA Insertion Date/Time: 10/22/2020 7:36 AM Performed by: Demetrio Lapping, CRNA Pre-anesthesia Checklist: Patient identified, Emergency Drugs available, Suction available and Patient being monitored Patient Re-evaluated:Patient Re-evaluated prior to induction Oxygen Delivery Method: Circle System Utilized Preoxygenation: Pre-oxygenation with 100% oxygen Induction Type: IV induction Ventilation: Mask ventilation without difficulty LMA: LMA inserted LMA Size: 4.0 Number of attempts: 1 Airway Equipment and Method: Bite block Placement Confirmation: positive ETCO2 Tube secured with: Tape Dental Injury: Teeth and Oropharynx as per pre-operative assessment

## 2020-10-22 NOTE — H&P (Signed)
Jon House is an 50 y.o. male.   Chief Complaint: left Foot osteomyelitis HPI: Patient is a 50 year old gentleman who is status post partial partial excision on the right he presents at this time stating that his left foot is getting worse and he has ulceration beneath the first and fifth metatarsal heads.  Patient states that his blood sugars have been running well in the 100-1 20 range.    Past Medical History:  Diagnosis Date   Diabetes mellitus without complication ([place])    Type II   Infection 10/04/2018   LEFT FOOT   Osteomyelitis of great toe of left foot ([place]) 07/29/2018   Subacute osteomyelitis, left ankle and foot [facility])     Past Surgical History:  Procedure Laterality Date   AMPUTATION Left 07/31/2018   Procedure: LEFT GREAT TOE AMPUTATION;  Surgeon: Newt Minion, MD;  Location: [place];  Service: Orthopedics;  Laterality: Left;   AMPUTATION Left 10/09/2018   Procedure: LEFT FOOT 2ND TOE AND POSSIBLE 3RD TOE AMPUTATION;  Surgeon: Newt Minion, MD;  Location: [place];  Service: Orthopedics;  Laterality: Left;   AMPUTATION Right 03/17/2020   Procedure: RIGHT GREAT TOE AMPUTATION THROUGH METATARSAL;  Surgeon: Newt Minion, MD;  Location: [place];  Service: Orthopedics;  Laterality: Right;   APPLICATION OF WOUND VAC Right 03/17/2020   Procedure: APPLICATION OF WOUND VAC;  Surgeon: Newt Minion, MD;  Location: [place];  Service: Orthopedics;  Laterality: Right;   I & D EXTREMITY Right 01/30/2020   Procedure: DEBRIDEMENT ABSCESS RIGHT FOOT;  Surgeon: Newt Minion, MD;  Location: [place];  Service: Orthopedics;  Laterality: Right;   I & D EXTREMITY Right 04/07/2020   Procedure: PARTIAL TARSAL EXCISION RIGHT FOOT;  Surgeon: Newt Minion, MD;  Location: [place];  Service: Orthopedics;  Laterality: Right;    Family History  Problem Relation Age of Onset   Diabetes Mother    Social History:  reports that he quit smoking about 15 years ago. His smoking use included cigarettes. He  has never used smokeless tobacco. He reports current alcohol use. He reports that he does not use drugs.  Allergies:  Allergies  Allergen Reactions   Bee Venom Hives   Metformin And Related Diarrhea    Medications Prior to Admission  Medication Sig Dispense Refill   insulin NPH-regular Human (70-30) 100 UNIT/ML injection Inject 15-25 Units into the skin 2 (two) times daily with a meal.     oxymetazoline (AFRIN ALL NIGHT NODRIP) 0.05 % nasal spray Place 1 spray into both nostrils at bedtime.     Blood Glucose Monitoring Suppl (TRUE METRIX METER) w/Device KIT Use to check blood sugars up to  3 times daily 1 kit 0   doxycycline (VIBRA-TABS) 100 MG tablet Take 1 tablet (100 mg total) by mouth 2 (two) times daily. (Patient not taking: Reported on 10/19/2020) 60 tablet 0   glucose blood (TRUE METRIX BLOOD GLUCOSE TEST) test strip Use as instructed to check blood sugars up to 3 x daily 100 each 12   Insulin Pen Needle (B-D UF III MINI PEN NEEDLES) 31G X 5 MM MISC Use as instructed. Inject into the skin once nightly. 100 each prn   oxyCODONE-acetaminophen (PERCOCET) 5-325 MG tablet Take 1 tablet by mouth every 4 (four) hours as needed. (Patient not taking: Reported on 10/19/2020) 30 tablet 0   TRUEplus Lancets 28G MISC Use to help check blood sugars up to 3 times per day 100 each 11  No results found for this or any previous visit (from the past 48 hour(s)). No results found.  Review of Systems  All other systems reviewed and are negative.  Height '5\' 9"'  (1.753 m), weight 75 kg. Physical Exam  Patient is alert, oriented, no adenopathy, well-dressed, normal affect, normal respiratory effort. Examination patient has good hair growth on the left foot he has a palpable dorsalis pedis pulse he is status post a great toe and second toe amputation he has a Wagner grade 3 ulcer beneath the fifth metatarsal head of the left foot after informed consent a 10 blade knife was used to debride the skin and  soft tissue back to healthy viable granulation tissue this was touched with silver nitrate the ulcer probes 1 cm to bone with osteomyelitis of the fifth metatarsal head.  Patient also has an extensive ulcer beneath the first metatarsal head which was also pared with no exposed bone.   Examination of the patient has a ulcer beneath the fifth metatarsal head.  This is 5 mm in diameter.  After informed consent a 10 blade knife was used debride the skin and soft tissue back to healthy viable granulation tissue after debridement the ulcer is 2 cm in diameter 1 cm deep and probes to bone.  Silver nitrate was used for hemostasis.Heart RRR Lungs Clear Assess 1. Subacute osteomyelitis, left ankle and foot ([place])       Plan: With the ulcer of the first metatarsal head and osteomyelitis of the fifth metatarsal head status post previous great toe and second toe amputation I have discussed the patient's best option is to proceed with a transmetatarsal amputation the left.  Risk and benefits were discussed including risk of the wound not healing.  Through an interpreter patient states he understands wished to proceed at this time we will set this up for Friday as an outpatient.  The importance of nonweightbearing postoperatively was discussed.ment/Plan   Bevely Palmer Jaris Kohles, PA 10/22/2020, 5:50 AM

## 2020-10-22 NOTE — Anesthesia Postprocedure Evaluation (Signed)
Anesthesia Post Note  Patient: Jon House  Procedure(s) Performed: LEFT TRANSMETATARSAL AMPUTATION (Left)     Patient location during evaluation: PACU Anesthesia Type: General Level of consciousness: awake and alert Pain management: pain level controlled Vital Signs Assessment: post-procedure vital signs reviewed and stable Respiratory status: spontaneous breathing, nonlabored ventilation and respiratory function stable Cardiovascular status: blood pressure returned to baseline and stable Postop Assessment: no apparent nausea or vomiting Anesthetic complications: no   No notable events documented.  Last Vitals:  Vitals:   10/22/20 0825 10/22/20 0855  BP: (!) 183/94   Pulse: (!) 104   Resp: 15   Temp:  (!) 36.1 C  SpO2: 98%     Last Pain:  Vitals:   10/22/20 0855  TempSrc:   PainSc: 0-No pain                 Lucretia Kern

## 2020-10-23 ENCOUNTER — Encounter (HOSPITAL_COMMUNITY): Payer: Self-pay | Admitting: Orthopedic Surgery

## 2020-11-05 ENCOUNTER — Encounter: Payer: Self-pay | Admitting: Family

## 2020-11-05 ENCOUNTER — Ambulatory Visit (INDEPENDENT_AMBULATORY_CARE_PROVIDER_SITE_OTHER): Payer: Self-pay | Admitting: Family

## 2020-11-05 DIAGNOSIS — Z89432 Acquired absence of left foot: Secondary | ICD-10-CM

## 2020-11-05 NOTE — Progress Notes (Signed)
Post-Op Visit Note   Patient: Jon House           Date of Birth: [birth year]           MRN: [id number] Visit Date: 11/05/2020 PCP: Patient, No Pcp Per (Inactive)  Chief Complaint:  Chief Complaint  Patient presents with   Left Foot - Routine Post Op    10/22/20 left transmet amputation     HPI:  HPI The patient is a 50 year old gentleman seen status post left transmetatarsal amputation June 24 he is non weightbearing using crutches Ortho Exam On examination of the left foot the amputation is well approximated sutures this is healing well there is no active drainage no surrounding erythema no concerning sign Visit Diagnoses:  1. History of transmetatarsal amputation of left foot (HCC)     Plan: Begin daily Dial soap cleansing.  Dry dressing changes.  Continue nonweightbearing.  We will follow-up in 1 week anticipate harvesting sutures at that time  Follow-Up Instructions: Return in about 1 week (around 11/12/2020).   Imaging: No results found.  Orders:  No orders of the defined types were placed in this encounter.  No orders of the defined types were placed in this encounter.    PMFS History: Patient Active Problem List   Diagnosis Date Noted   Subacute osteomyelitis, right ankle and foot (HCC)    Osteomyelitis of great toe of right foot (HCC)    History of amputation of lesser toe of left foot (HCC) 10/16/2018   Subacute osteomyelitis, left ankle and foot (HCC)    Diabetic infection of left foot (HCC) 10/04/2018   Diabetic infection of right foot (HCC) 10/04/2018   Diabetic polyneuropathy associated with type 2 diabetes mellitus (HCC)    Past Medical History:  Diagnosis Date   Diabetes mellitus without complication (HCC)    Type II   Infection 10/04/2018   LEFT FOOT   Osteomyelitis of great toe of left foot (HCC) 07/29/2018   Subacute osteomyelitis, left ankle and foot (HCC)     Family History  Problem Relation Age of Onset   Diabetes Mother      Past Surgical History:  Procedure Laterality Date   AMPUTATION Left 07/31/2018   Procedure: LEFT GREAT TOE AMPUTATION;  Surgeon: [name], MD;  Location: [facility] OR;  Service: Orthopedics;  Laterality: Left;   AMPUTATION Left 10/09/2018   Procedure: LEFT FOOT 2ND TOE AND POSSIBLE 3RD TOE AMPUTATION;  Surgeon: [name], MD;  Location: [facility] OR;  Service: Orthopedics;  Laterality: Left;   AMPUTATION Right 03/17/2020   Procedure: RIGHT GREAT TOE AMPUTATION THROUGH METATARSAL;  Surgeon: [name], MD;  Location: [facility] OR;  Service: Orthopedics;  Laterality: Right;   AMPUTATION Left 10/22/2020   Procedure: LEFT TRANSMETATARSAL AMPUTATION;  Surgeon: [name], MD;  Location: [facility] OR;  Service: Orthopedics;  Laterality: Left;   APPLICATION OF WOUND VAC Right 03/17/2020   Procedure: APPLICATION OF WOUND VAC;  Surgeon: [name], MD;  Location: [facility] OR;  Service: Orthopedics;  Laterality: Right;   I & D EXTREMITY Right 01/30/2020   Procedure: DEBRIDEMENT ABSCESS RIGHT FOOT;  Surgeon: [name], MD;  Location: [facility] OR;  Service: Orthopedics;  Laterality: Right;   I & D EXTREMITY Right 04/07/2020   Procedure: PARTIAL TARSAL EXCISION RIGHT FOOT;  Surgeon: [name], MD;  Location: [facility] OR;  Service: Orthopedics;  Laterality: Right;   Social History   Occupational History   Not on file  Tobacco  Use   Smoking status: Former    Years: 27.00    Pack years: 0.00    Types: Cigarettes    Quit date: 2007    Years since quitting: 15.5   Smokeless tobacco: Never   Tobacco comments:    03/16/20- quit 12- 15 years ago  Vaping Use   Vaping Use: Never used  Substance and Sexual Activity   Alcohol use: Yes    Comment: ocassionaly 1 beer   Drug use: Never   Sexual activity: Not on file

## 2020-11-12 ENCOUNTER — Ambulatory Visit (INDEPENDENT_AMBULATORY_CARE_PROVIDER_SITE_OTHER): Payer: Self-pay | Admitting: Family

## 2020-11-12 ENCOUNTER — Encounter: Payer: Self-pay | Admitting: Family

## 2020-11-12 DIAGNOSIS — Z89432 Acquired absence of left foot: Secondary | ICD-10-CM

## 2020-11-12 MED ORDER — OXYCODONE-ACETAMINOPHEN 5-325 MG PO TABS: 1.0000 | ORAL_TABLET | Freq: Four times a day (QID) | ORAL | 0 refills | Status: AC | PRN

## 2020-11-12 NOTE — Progress Notes (Signed)
Post-Op Visit Note   Patient: Jon House           Date of Birth: 1970-10-22           MRN: [id number] Visit Date: 11/12/2020 PCP: Patient, No Pcp Per (Inactive)  Chief Complaint:  Chief Complaint  Patient presents with   Left Foot - Follow-up    HPI:  HPI The patient is a 50 year old gentleman seen status post transmetatarsal amputation of the left foot he is ambulating with crutches and a postop shoe he is continues to have some issues with pain. Ortho Exam On examination of the left foot incision is well-healed.  Sutures harvested today without incident.  There is no gaping no drainage no erythema this is well-healed  Visit Diagnoses: No diagnosis found.  Plan: He will advance his weightbearing as tolerated.  Follow-up in the office in 2 weeks for clinical reevaluation.  Have refilled his pain medication 1 last time.  Follow-Up Instructions: No follow-ups on file.   Imaging: No results found.  Orders:  No orders of the defined types were placed in this encounter.  Meds ordered this encounter  Medications   oxyCODONE-acetaminophen (PERCOCET) 5-325 MG tablet    Sig: Take 1 tablet by mouth every 6 (six) hours as needed.    Dispense:  30 tablet    Refill:  0     PMFS History: Patient Active Problem List   Diagnosis Date Noted   Subacute osteomyelitis, right ankle and foot (HCC)    Osteomyelitis of great toe of right foot (HCC)    History of amputation of lesser toe of left foot (HCC) 10/16/2018   Subacute osteomyelitis, left ankle and foot (HCC)    Diabetic infection of left foot (HCC) 10/04/2018   Diabetic infection of right foot (HCC) 10/04/2018   Diabetic polyneuropathy associated with type 2 diabetes mellitus (HCC)    Past Medical History:  Diagnosis Date   Diabetes mellitus without complication (HCC)    Type II   Infection 10/04/2018   LEFT FOOT   Osteomyelitis of great toe of left foot (HCC) 07/29/2018   Subacute osteomyelitis, left ankle and  foot (HCC)     Family History  Problem Relation Age of Onset   Diabetes Mother     Past Surgical History:  Procedure Laterality Date   AMPUTATION Left 07/31/2018   Procedure: LEFT GREAT TOE AMPUTATION;  Surgeon: [name], MD;  Location: [facility] OR;  Service: Orthopedics;  Laterality: Left;   AMPUTATION Left 10/09/2018   Procedure: LEFT FOOT 2ND TOE AND POSSIBLE 3RD TOE AMPUTATION;  Surgeon: [name], MD;  Location: [facility] OR;  Service: Orthopedics;  Laterality: Left;   AMPUTATION Right 03/17/2020   Procedure: RIGHT GREAT TOE AMPUTATION THROUGH METATARSAL;  Surgeon: [name], MD;  Location: [facility] OR;  Service: Orthopedics;  Laterality: Right;   AMPUTATION Left 10/22/2020   Procedure: LEFT TRANSMETATARSAL AMPUTATION;  Surgeon: [name], MD;  Location: [facility] OR;  Service: Orthopedics;  Laterality: Left;   APPLICATION OF WOUND VAC Right 03/17/2020   Procedure: APPLICATION OF WOUND VAC;  Surgeon: [name], MD;  Location: [facility] OR;  Service: Orthopedics;  Laterality: Right;   I & D EXTREMITY Right 01/30/2020   Procedure: DEBRIDEMENT ABSCESS RIGHT FOOT;  Surgeon: [name], MD;  Location: [facility] OR;  Service: Orthopedics;  Laterality: Right;   I & D EXTREMITY Right 04/07/2020   Procedure: PARTIAL TARSAL EXCISION RIGHT FOOT;  Surgeon: [name], MD;  Location: [facility] OR;  Service: Orthopedics;  Laterality: Right;   Social History   Occupational History   Not on file  Tobacco Use   Smoking status: Former    Years: 27.00    Types: Cigarettes    Quit date: 2007    Years since quitting: 15.5   Smokeless tobacco: Never   Tobacco comments:    03/16/20- quit 12- 15 years ago  Vaping Use   Vaping Use: Never used  Substance and Sexual Activity   Alcohol use: Yes    Comment: ocassionaly 1 beer   Drug use: Never   Sexual activity: Not on file

## 2020-11-26 ENCOUNTER — Ambulatory Visit (INDEPENDENT_AMBULATORY_CARE_PROVIDER_SITE_OTHER): Payer: Self-pay | Admitting: Family

## 2020-11-26 ENCOUNTER — Encounter: Payer: Self-pay | Admitting: Family

## 2020-11-26 DIAGNOSIS — Z89432 Acquired absence of left foot: Secondary | ICD-10-CM

## 2020-11-26 NOTE — Progress Notes (Signed)
Post-Op Visit Note   Patient: Jon House           Date of Birth: December 18, 1970           MRN: [id number] Visit Date: 11/26/2020 PCP: Patient, No Pcp Per (Inactive)  Chief Complaint: No chief complaint on file.   HPI:  HPI The patient is a 50 year old gentleman seen status post left metatarsal amputation this is well-healed.  He is been having some anger in his right foot over what appears to be a rocker-bottom deformity.  He is status post first ray amputation on the right.  He has been in regular shoewear having some pain with ambulation Ortho Exam On examination of the left foot the transmetatarsal amputation is well-healed there is no open area no callus no impending ulceration.  On examination of the right foot his first ray amputation site is well-healed there is no edema no erythema no warmth he does have a rocker-bottom deformity.  Visit Diagnoses: No diagnosis found.  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  Status post first ray amputation on the right.  Transmetatarsal amputation on the left and recommended custom orthotics with carbon fiber plate and a spacer for the left foot provided an order to [facility] to the patient today for follow-up in the office as needed  Follow-Up Instructions: No follow-ups on file.   Imaging: No results found.  Orders:  No orders of the defined types were placed in this encounter.  No orders of the defined types were placed in this encounter.    PMFS History: Patient Active Problem List   Diagnosis Date Noted   Subacute osteomyelitis, right ankle and foot (HCC)    Osteomyelitis of great toe of right foot (HCC)    History of amputation of lesser toe of left foot (HCC) 10/16/2018   Subacute osteomyelitis, left ankle and foot (HCC)    Diabetic infection of left foot (HCC) 10/04/2018   Diabetic infection of right foot (HCC) 10/04/2018   Diabetic polyneuropathy associated with type 2  diabetes mellitus (HCC)    Past Medical History:  Diagnosis Date   Diabetes mellitus without complication (HCC)    Type II   Infection 10/04/2018   LEFT FOOT   Osteomyelitis of great toe of left foot (HCC) 07/29/2018   Subacute osteomyelitis, left ankle and foot (HCC)     Family History  Problem Relation Age of Onset   Diabetes Mother     Past Surgical History:  Procedure Laterality Date   AMPUTATION Left 07/31/2018   Procedure: LEFT GREAT TOE AMPUTATION;  Surgeon: [name], MD;  Location: [facility] OR;  Service: Orthopedics;  Laterality: Left;   AMPUTATION Left 10/09/2018   Procedure: LEFT FOOT 2ND TOE AND POSSIBLE 3RD TOE AMPUTATION;  Surgeon: [name], MD;  Location: [facility] OR;  Service: Orthopedics;  Laterality: Left;   AMPUTATION Right 03/17/2020   Procedure: RIGHT GREAT TOE AMPUTATION THROUGH METATARSAL;  Surgeon: [name], MD;  Location: [facility] OR;  Service: Orthopedics;  Laterality: Right;   AMPUTATION Left 10/22/2020   Procedure: LEFT TRANSMETATARSAL AMPUTATION;  Surgeon: [name], MD;  Location: [facility] OR;  Service: Orthopedics;  Laterality: Left;   APPLICATION OF WOUND VAC Right 03/17/2020   Procedure: APPLICATION OF WOUND VAC;  Surgeon: [name], MD;  Location: [facility] OR;  Service: Orthopedics;  Laterality: Right;   I & D EXTREMITY Right 01/30/2020   Procedure: DEBRIDEMENT ABSCESS RIGHT FOOT;  Surgeon: Aldean Baker  V, MD;  Location: [facility] OR;  Service: Orthopedics;  Laterality: Right;   I & D EXTREMITY Right 04/07/2020   Procedure: PARTIAL TARSAL EXCISION RIGHT FOOT;  Surgeon: [name], MD;  Location: [facility] OR;  Service: Orthopedics;  Laterality: Right;   Social History   Occupational History   Not on file  Tobacco Use   Smoking status: Former    Years: 27.00    Types: Cigarettes    Quit date: 2007    Years since quitting: 15.5   Smokeless tobacco: Never   Tobacco comments:    03/16/20- quit 12- 15 years ago  Vaping Use   Vaping Use: Never used  Substance  and Sexual Activity   Alcohol use: Yes    Comment: ocassionaly 1 beer   Drug use: Never   Sexual activity: Not on file

## 2021-05-23 ENCOUNTER — Telehealth: Payer: Self-pay | Admitting: Orthopedic Surgery

## 2021-05-23 NOTE — Telephone Encounter (Signed)
Can you please write rx for [facility] see dictation from 10/2020

## 2021-05-23 NOTE — Telephone Encounter (Signed)
Patient walked into the office during lunch requesting a new Rx for a prosthetic. States he was give one about 3 months ago and lost it.    Please call patient @ [phone]

## 2021-07-21 ENCOUNTER — Ambulatory Visit (INDEPENDENT_AMBULATORY_CARE_PROVIDER_SITE_OTHER): Payer: Self-pay | Admitting: Orthopedic Surgery

## 2021-07-21 ENCOUNTER — Other Ambulatory Visit: Payer: Self-pay

## 2021-07-21 DIAGNOSIS — Z89432 Acquired absence of left foot: Secondary | ICD-10-CM

## 2021-07-22 ENCOUNTER — Encounter: Payer: Self-pay | Admitting: Orthopedic Surgery

## 2021-07-22 NOTE — Progress Notes (Signed)
? ?Office Visit Note ?  ?Patient: Jon House           ?Date of Birth: 10/25/1970           ?MRN: [id number] ?Visit Date: 07/21/2021 ?             ?Requested by: No referring provider defined for this encounter. ?PCP: Patient, No Pcp Per (Inactive) ? ?Chief Complaint  ?Patient presents with  ? Left Foot - Follow-up  ?  Hx left transmet amputation  ? ? ? ? ?HPI: ?Patient is a 51 year old gentleman who is seen for initial evaluation for blunt trauma left foot.  He is status post a left transmetatarsal amputation.  Patient is currently full weightbearing he states that there is a small opening on the bottom of his foot with maceration drainage and pain. ? ?Assessment & Plan: ?Visit Diagnoses:  ?1. History of transmetatarsal amputation of left foot (HCC)   ? ? ?Plan: A felt relieving donut was placed under his custom orthotics. ? ?Follow-Up Instructions: Return in about 4 weeks (around 08/18/2021).  ? ?Ortho Exam ? ?Patient is alert, oriented, no adenopathy, well-dressed, normal affect, normal respiratory effort. ?Examination patient does have a plantar ulcer.  After informed consent a 10 blade knife was used to debride the skin and soft tissue back to healthy viable granulation tissue.  After debridement the ulcer is 20 x 30 x 1 mm deep.  There is no undermining no tunneling no exposed bone or tendon.  A donut was placed under his custom orthotics to offload this ulcerative area ? ?Imaging: ?No results found. ?No images are attached to the encounter. ? ?Labs: ?Lab Results  ?Component Value Date  ? HGBA1C 10.2 (A) 07/07/2019  ? HGBA1C 9.6 (A) 10/02/2018  ? HGBA1C 11.1 (H) 07/29/2018  ? ESRSEDRATE 3 07/07/2019  ? ESRSEDRATE 69 (H) 10/06/2018  ? ESRSEDRATE 74 (H) 10/05/2018  ? CRP 13.7 (H) 10/06/2018  ? CRP 19.7 (H) 10/05/2018  ? CRP 19.7 (H) 10/04/2018  ? REPTSTATUS 02/04/2020 FINAL 01/30/2020  ? GRAMSTAIN  01/30/2020  ?  RARE WBC PRESENT, PREDOMINANTLY PMN ?RARE GRAM POSITIVE COCCI ?  ? CULT  01/30/2020  ?  FEW  ENTEROBACTER CLOACAE ?FEW ENTEROCOCCUS FAECALIS ?NO ANAEROBES ISOLATED ?Performed at [facility] Lab, [address] [place], [place] [address] ?  ? LABORGA ENTEROBACTER CLOACAE 01/30/2020  ? LABORGA ENTEROCOCCUS FAECALIS 01/30/2020  ? ? ? ?Lab Results  ?Component Value Date  ? ALBUMIN 3.9 10/03/2018  ? ALBUMIN 4.7 08/05/2018  ? ALBUMIN 3.9 07/29/2018  ? PREALBUMIN 9.6 (L) 10/04/2018  ? ? ?No results found for: MG ?No results found for: VD25OH ? ?Lab Results  ?Component Value Date  ? PREALBUMIN 9.6 (L) 10/04/2018  ? ? ?  Latest Ref Rng & Units 10/22/2020  ?  5:50 AM 01/27/2020  ?  7:41 AM 10/10/2018  ?  6:48 AM  ?CBC EXTENDED  ?WBC 4.0 - 10.5 K/uL 7.3   5.8   9.5    ?RBC 4.22 - 5.81 MIL/uL 5.01   4.70   3.74    ?Hemoglobin 13.0 - 17.0 g/dL 56.2   13.0   86.5    ?HCT 39.0 - 52.0 % 43.2   41.4   32.7    ?Platelets 150 - 400 K/uL 216   343   305    ? ? ? ?There is no height or weight on file to calculate BMI. ? ?Orders:  ?No orders of the defined types were placed in  this encounter. ? ?No orders of the defined types were placed in this encounter. ? ? ? Procedures: ?No procedures performed ? ?Clinical Data: ?No additional findings. ? ?ROS: ? ?All other systems negative, except as noted in the HPI. ?Review of Systems ? ?Objective: ?Vital Signs: There were no vitals taken for this visit. ? ?Specialty Comments:  ?No specialty comments available. ? ?PMFS History: ?Patient Active Problem List  ? Diagnosis Date Noted  ? Subacute osteomyelitis, right ankle and foot (HCC)   ? Osteomyelitis of great toe of right foot (HCC)   ? History of amputation of lesser toe of left foot (HCC) 10/16/2018  ? Subacute osteomyelitis, left ankle and foot (HCC)   ? Diabetic infection of left foot (HCC) 10/04/2018  ? Diabetic infection of right foot (HCC) 10/04/2018  ? Diabetic polyneuropathy associated with type 2 diabetes mellitus (HCC)   ? ?Past Medical History:  ?Diagnosis Date  ? Diabetes mellitus without complication (HCC)   ? Type II  ?  Infection 10/04/2018  ? LEFT FOOT  ? Osteomyelitis of great toe of left foot (HCC) 07/29/2018  ? Subacute osteomyelitis, left ankle and foot (HCC)   ?  ?Family History  ?Problem Relation Age of Onset  ? Diabetes Mother   ?  ?Past Surgical History:  ?Procedure Laterality Date  ? AMPUTATION Left 07/31/2018  ? Procedure: LEFT GREAT TOE AMPUTATION;  Surgeon: [name], MD;  Location: [facility] OR;  Service: Orthopedics;  Laterality: Left;  ? AMPUTATION Left 10/09/2018  ? Procedure: LEFT FOOT 2ND TOE AND POSSIBLE 3RD TOE AMPUTATION;  Surgeon: [name], MD;  Location: [facility] OR;  Service: Orthopedics;  Laterality: Left;  ? AMPUTATION Right 03/17/2020  ? Procedure: RIGHT GREAT TOE AMPUTATION THROUGH METATARSAL;  Surgeon: [name], MD;  Location: [facility] OR;  Service: Orthopedics;  Laterality: Right;  ? AMPUTATION Left 10/22/2020  ? Procedure: LEFT TRANSMETATARSAL AMPUTATION;  Surgeon: [name], MD;  Location: [facility] OR;  Service: Orthopedics;  Laterality: Left;  ? APPLICATION OF WOUND VAC Right 03/17/2020  ? Procedure: APPLICATION OF WOUND VAC;  Surgeon: [name], MD;  Location: [facility] OR;  Service: Orthopedics;  Laterality: Right;  ? I & D EXTREMITY Right 01/30/2020  ? Procedure: DEBRIDEMENT ABSCESS RIGHT FOOT;  Surgeon: [name], MD;  Location: [facility] OR;  Service: Orthopedics;  Laterality: Right;  ? I & D EXTREMITY Right 04/07/2020  ? Procedure: PARTIAL TARSAL EXCISION RIGHT FOOT;  Surgeon: [name], MD;  Location: [facility] OR;  Service: Orthopedics;  Laterality: Right;  ? ?Social History  ? ?Occupational History  ? Not on file  ?Tobacco Use  ? Smoking status: Former  ?  Years: 27.00  ?  Types: Cigarettes  ?  Quit date: 2007  ?  Years since quitting: 16.2  ? Smokeless tobacco: Never  ? Tobacco comments:  ?  03/16/20- quit 12- 15 years ago  ?Vaping Use  ? Vaping Use: Never used  ?Substance and Sexual Activity  ? Alcohol use: Yes  ?  Comment: ocassionaly 1 beer  ? Drug use: Never  ? Sexual activity: Not on file   ? ? ? ? ? ?

## 2021-08-18 ENCOUNTER — Ambulatory Visit (INDEPENDENT_AMBULATORY_CARE_PROVIDER_SITE_OTHER): Payer: Self-pay | Admitting: Orthopedic Surgery

## 2021-08-18 DIAGNOSIS — Z89432 Acquired absence of left foot: Secondary | ICD-10-CM

## 2021-08-21 ENCOUNTER — Encounter: Payer: Self-pay | Admitting: Orthopedic Surgery

## 2021-08-21 NOTE — Progress Notes (Signed)
? ?Office Visit Note ?  ?Patient: Jon House           ?Date of Birth: 1970-09-27           ?MRN: [id number] ?Visit Date: 08/18/2021 ?             ?Requested by: No referring provider defined for this encounter. ?PCP: Patient, No Pcp Per (Inactive) ? ?Chief Complaint  ?Patient presents with  ? Left Foot - Wound Check  ?  Hx left transmet  ? ? ?He remove this saying it did not work.  Patient cut out a area of his insert.  Patient states he does not use a Band-Aid or dressing while at home patient had a Band-Aid on today. ? ?HPI: ?Patient is a 51 year old gentleman who is 10 months status post left transmetatarsal amputation with an ulcer on the plantar aspect of his foot.  Patient had a felt relieving pad applied ? ?Assessment & Plan: ?Visit Diagnoses:  ?1. History of transmetatarsal amputation of left foot (HCC)   ? ? ?Plan: The ulcer was debrided of skin and soft tissue this did not probe to bone.  Patient will continue with his current offloading technique. ? ?Follow-Up Instructions: Return in about 4 weeks (around 09/15/2021).  ? ?Ortho Exam ? ?Patient is alert, oriented, no adenopathy, well-dressed, normal affect, normal respiratory effort. ?Examination patient has good offloading with his orthotic modification.  He has an ulcer on the plantar aspect of the residual foot.  After informed consent a 10 blade knife was used to debride the skin and soft tissue back to healthy viable granulation tissue.  This was touched with silver nitrate for hemostasis the ulcer does not probe to bone there is no cellulitis odor or drainage.  Prior to debridement the ulcer is 1 cm diameter after debridement the ulcer is 2 cm in diameter and 2 mm deep.  Patient does not have a recent hemoglobin A1c on file. ? ?Imaging: ?No results found. ?No images are attached to the encounter. ? ?Labs: ?Lab Results  ?Component Value Date  ? HGBA1C 10.2 (A) 07/07/2019  ? HGBA1C 9.6 (A) 10/02/2018  ? HGBA1C 11.1 (H) 07/29/2018  ? ESRSEDRATE  3 07/07/2019  ? ESRSEDRATE 69 (H) 10/06/2018  ? ESRSEDRATE 74 (H) 10/05/2018  ? CRP 13.7 (H) 10/06/2018  ? CRP 19.7 (H) 10/05/2018  ? CRP 19.7 (H) 10/04/2018  ? REPTSTATUS 02/04/2020 FINAL 01/30/2020  ? GRAMSTAIN  01/30/2020  ?  RARE WBC PRESENT, PREDOMINANTLY PMN ?RARE GRAM POSITIVE COCCI ?  ? CULT  01/30/2020  ?  FEW ENTEROBACTER CLOACAE ?FEW ENTEROCOCCUS FAECALIS ?NO ANAEROBES ISOLATED ?Performed at [facility], [address] [place], [place] [address] ?  ? LABORGA ENTEROBACTER CLOACAE 01/30/2020  ? LABORGA ENTEROCOCCUS FAECALIS 01/30/2020  ? ? ? ?Lab Results  ?Component Value Date  ? ALBUMIN 3.9 10/03/2018  ? ALBUMIN 4.7 08/05/2018  ? ALBUMIN 3.9 07/29/2018  ? PREALBUMIN 9.6 (L) 10/04/2018  ? ? ?No results found for: MG ?No results found for: VD25OH ? ?Lab Results  ?Component Value Date  ? PREALBUMIN 9.6 (L) 10/04/2018  ? ? ?  Latest Ref Rng & Units 10/22/2020  ?  5:50 AM 01/27/2020  ?  7:41 AM 10/10/2018  ?  6:48 AM  ?CBC EXTENDED  ?WBC 4.0 - 10.5 K/uL 7.3   5.8   9.5    ?RBC 4.22 - 5.81 MIL/uL 5.01   4.70   3.74    ?Hemoglobin 13.0 - 17.0 g/dL 14.6  13.8   11.2    ?HCT 39.0 - 52.0 % 43.2   41.4   32.7    ?Platelets 150 - 400 K/uL 216   343   305    ? ? ? ?There is no height or weight on file to calculate BMI. ? ?Orders:  ?No orders of the defined types were placed in this encounter. ? ?No orders of the defined types were placed in this encounter. ? ? ? Procedures: ?No procedures performed ? ?Clinical Data: ?No additional findings. ? ?ROS: ? ?All other systems negative, except as noted in the HPI. ?Review of Systems ? ?Objective: ?Vital Signs: There were no vitals taken for this visit. ? ?Specialty Comments:  ?No specialty comments available. ? ?PMFS History: ?Patient Active Problem List  ? Diagnosis Date Noted  ? Subacute osteomyelitis, right ankle and foot (HCC)   ? Osteomyelitis of great toe of right foot (HCC)   ? History of amputation of lesser toe of left foot (HCC) 10/16/2018  ? Subacute  osteomyelitis, left ankle and foot (HCC)   ? Diabetic infection of left foot (HCC) 10/04/2018  ? Diabetic infection of right foot (HCC) 10/04/2018  ? Diabetic polyneuropathy associated with type 2 diabetes mellitus (HCC)   ? ?Past Medical History:  ?Diagnosis Date  ? Diabetes mellitus without complication (HCC)   ? Type II  ? Infection 10/04/2018  ? LEFT FOOT  ? Osteomyelitis of great toe of left foot (HCC) 07/29/2018  ? Subacute osteomyelitis, left ankle and foot (HCC)   ?  ?Family History  ?Problem Relation Age of Onset  ? Diabetes Mother   ?  ?Past Surgical History:  ?Procedure Laterality Date  ? AMPUTATION Left 07/31/2018  ? Procedure: LEFT GREAT TOE AMPUTATION;  Surgeon: [name], MD;  Location: [facility] OR;  Service: Orthopedics;  Laterality: Left;  ? AMPUTATION Left 10/09/2018  ? Procedure: LEFT FOOT 2ND TOE AND POSSIBLE 3RD TOE AMPUTATION;  Surgeon: [name], MD;  Location: [facility] OR;  Service: Orthopedics;  Laterality: Left;  ? AMPUTATION Right 03/17/2020  ? Procedure: RIGHT GREAT TOE AMPUTATION THROUGH METATARSAL;  Surgeon: [name], MD;  Location: [facility] OR;  Service: Orthopedics;  Laterality: Right;  ? AMPUTATION Left 10/22/2020  ? Procedure: LEFT TRANSMETATARSAL AMPUTATION;  Surgeon: [name], MD;  Location: [facility] OR;  Service: Orthopedics;  Laterality: Left;  ? APPLICATION OF WOUND VAC Right 03/17/2020  ? Procedure: APPLICATION OF WOUND VAC;  Surgeon: [name], MD;  Location: [facility] OR;  Service: Orthopedics;  Laterality: Right;  ? I & D EXTREMITY Right 01/30/2020  ? Procedure: DEBRIDEMENT ABSCESS RIGHT FOOT;  Surgeon: [name], MD;  Location: [facility] OR;  Service: Orthopedics;  Laterality: Right;  ? I & D EXTREMITY Right 04/07/2020  ? Procedure: PARTIAL TARSAL EXCISION RIGHT FOOT;  Surgeon: [name], MD;  Location: [facility] OR;  Service: Orthopedics;  Laterality: Right;  ? ?Social History  ? ?Occupational History  ? Not on file  ?Tobacco Use  ? Smoking status: Former  ?  Years: 27.00  ?  Types:  Cigarettes  ?  Quit date: 2007  ?  Years since quitting: 16.3  ? Smokeless tobacco: Never  ? Tobacco comments:  ?  03/16/20- quit 12- 15 years ago  ?Vaping Use  ? Vaping Use: Never used  ?Substance and Sexual Activity  ? Alcohol use: Yes  ?  Comment: ocassionaly 1 beer  ? Drug use: Never  ? Sexual activity: Not on  file  ? ? ? ? ? ?

## 2021-09-15 ENCOUNTER — Ambulatory Visit (INDEPENDENT_AMBULATORY_CARE_PROVIDER_SITE_OTHER): Payer: Self-pay | Admitting: Orthopedic Surgery

## 2021-09-15 ENCOUNTER — Encounter: Payer: Self-pay | Admitting: Orthopedic Surgery

## 2021-09-15 DIAGNOSIS — Z89432 Acquired absence of left foot: Secondary | ICD-10-CM

## 2021-09-15 NOTE — Progress Notes (Signed)
Office Visit Note   Patient: Jon House           Date of Birth: 02/01/1971           MRN: [id number] Visit Date: 09/15/2021              Requested by: No referring provider defined for this encounter. PCP: Patient, No Pcp Per (Inactive)  Chief Complaint  Patient presents with   Left Foot - Wound Check      HPI: Patient is a 51 year old gentleman who is 50-month status post left foot transmetatarsal amputation.  He still has a pressure ulcer over the plantar medial aspect beneath the first ray.  Assessment & Plan: Visit Diagnoses:  1. History of transmetatarsal amputation of left foot (HCC)     Plan: Patient has a carbon plate a custom orthotic with spacer there is a felt relieving pad placed plantarly and he has a separate insole pad placed dorsally to further offload the ulcer.  His glucose is under better control.  Follow-Up Instructions: Return in about 4 weeks (around 10/13/2021).   Ortho Exam  Patient is alert, oriented, no adenopathy, well-dressed, normal affect, normal respiratory effort. Examination the ulcer was debrided after informed consent with a 10 blade knife this left a wound that was 5 mm in diameter point 1 mm deep with healthy granulation tissue no exposed bone or tendon no drainage no odor no cellulitis no signs of infection.  Imaging: No results found. No images are attached to the encounter.  Labs: Lab Results  Component Value Date   HGBA1C 10.2 (A) 07/07/2019   HGBA1C 9.6 (A) 10/02/2018   HGBA1C 11.1 (H) 07/29/2018   ESRSEDRATE 3 07/07/2019   ESRSEDRATE 69 (H) 10/06/2018   ESRSEDRATE 74 (H) 10/05/2018   CRP 13.7 (H) 10/06/2018   CRP 19.7 (H) 10/05/2018   CRP 19.7 (H) 10/04/2018   REPTSTATUS 02/04/2020 FINAL 01/30/2020   GRAMSTAIN  01/30/2020    RARE WBC PRESENT, PREDOMINANTLY PMN RARE GRAM POSITIVE COCCI    CULT  01/30/2020    FEW ENTEROBACTER CLOACAE FEW ENTEROCOCCUS FAECALIS NO ANAEROBES ISOLATED Performed at [facility], [address], [place], [place] [address]    LABORGA ENTEROBACTER CLOACAE 01/30/2020   LABORGA ENTEROCOCCUS FAECALIS 01/30/2020     Lab Results  Component Value Date   ALBUMIN 3.9 10/03/2018   ALBUMIN 4.7 08/05/2018   ALBUMIN 3.9 07/29/2018   PREALBUMIN 9.6 (L) 10/04/2018    No results found for: MG No results found for: VD25OH  Lab Results  Component Value Date   PREALBUMIN 9.6 (L) 10/04/2018      Latest Ref Rng & Units 10/22/2020    5:50 AM 01/27/2020    7:41 AM 10/10/2018    6:48 AM  CBC EXTENDED  WBC 4.0 - 10.5 K/uL 7.3   5.8   9.5    RBC 4.22 - 5.81 MIL/uL 5.01   4.70   3.74    Hemoglobin 13.0 - 17.0 g/dL 24.2   68.3   41.9    HCT 39.0 - 52.0 % 43.2   41.4   32.7    Platelets 150 - 400 K/uL 216   343   305       There is no height or weight on file to calculate BMI.  Orders:  No orders of the defined types were placed in this encounter.  No orders of the defined types were placed in this encounter.    Procedures: No procedures  performed  Clinical Data: No additional findings.  ROS:  All other systems negative, except as noted in the HPI. Review of Systems  Objective: Vital Signs: There were no vitals taken for this visit.  Specialty Comments:  No specialty comments available.  PMFS History: Patient Active Problem List   Diagnosis Date Noted   Subacute osteomyelitis, right ankle and foot (HCC)    Osteomyelitis of great toe of right foot (HCC)    History of amputation of lesser toe of left foot (HCC) 10/16/2018   Subacute osteomyelitis, left ankle and foot (HCC)    Diabetic infection of left foot (HCC) 10/04/2018   Diabetic infection of right foot (HCC) 10/04/2018   Diabetic polyneuropathy associated with type 2 diabetes mellitus (HCC)    Past Medical History:  Diagnosis Date   Diabetes mellitus without complication (HCC)    Type II   Infection 10/04/2018   LEFT FOOT   Osteomyelitis of great toe of left foot (HCC) 07/29/2018    Subacute osteomyelitis, left ankle and foot (HCC)     Family History  Problem Relation Age of Onset   Diabetes Mother     Past Surgical History:  Procedure Laterality Date   AMPUTATION Left 07/31/2018   Procedure: LEFT GREAT TOE AMPUTATION;  Surgeon: [name], MD;  Location: [facility] OR;  Service: Orthopedics;  Laterality: Left;   AMPUTATION Left 10/09/2018   Procedure: LEFT FOOT 2ND TOE AND POSSIBLE 3RD TOE AMPUTATION;  Surgeon: [name], MD;  Location: [facility] OR;  Service: Orthopedics;  Laterality: Left;   AMPUTATION Right 03/17/2020   Procedure: RIGHT GREAT TOE AMPUTATION THROUGH METATARSAL;  Surgeon: [name], MD;  Location: [facility] OR;  Service: Orthopedics;  Laterality: Right;   AMPUTATION Left 10/22/2020   Procedure: LEFT TRANSMETATARSAL AMPUTATION;  Surgeon: [name], MD;  Location: [facility] OR;  Service: Orthopedics;  Laterality: Left;   APPLICATION OF WOUND VAC Right 03/17/2020   Procedure: APPLICATION OF WOUND VAC;  Surgeon: [name], MD;  Location: [facility] OR;  Service: Orthopedics;  Laterality: Right;   I & D EXTREMITY Right 01/30/2020   Procedure: DEBRIDEMENT ABSCESS RIGHT FOOT;  Surgeon: [name], MD;  Location: [facility] OR;  Service: Orthopedics;  Laterality: Right;   I & D EXTREMITY Right 04/07/2020   Procedure: PARTIAL TARSAL EXCISION RIGHT FOOT;  Surgeon: [name], MD;  Location: [facility] OR;  Service: Orthopedics;  Laterality: Right;   Social History   Occupational History   Not on file  Tobacco Use   Smoking status: Former    Years: 27.00    Types: Cigarettes    Quit date: 2007    Years since quitting: 16.3   Smokeless tobacco: Never   Tobacco comments:    03/16/20- quit 12- 15 years ago  Vaping Use   Vaping Use: Never used  Substance and Sexual Activity   Alcohol use: Yes    Comment: ocassionaly 1 beer   Drug use: Never   Sexual activity: Not on file

## 2021-10-17 ENCOUNTER — Encounter: Payer: Self-pay | Admitting: Orthopedic Surgery

## 2021-10-17 ENCOUNTER — Ambulatory Visit (INDEPENDENT_AMBULATORY_CARE_PROVIDER_SITE_OTHER): Payer: Self-pay | Admitting: Orthopedic Surgery

## 2021-10-17 DIAGNOSIS — Z89432 Acquired absence of left foot: Secondary | ICD-10-CM

## 2021-10-17 DIAGNOSIS — L97521 Non-pressure chronic ulcer of other part of left foot limited to breakdown of skin: Secondary | ICD-10-CM

## 2021-10-17 NOTE — Progress Notes (Signed)
Office Visit Note   Patient: Jon House           Date of Birth: [birth year]           MRN: [id number] Visit Date: 10/17/2021              Requested by: No referring provider defined for this encounter. PCP: Patient, No Pcp Per  Chief Complaint  Patient presents with   Left Foot - Open Wound      HPI: Patient is a 51 year old gentleman who is status post a left transmetatarsal amputation about a year ago.  Patient has good orthotics to unload pressure he states he works on his feet.  States the ulcer is not getting any better.  Assessment & Plan: Visit Diagnoses:  1. History of transmetatarsal amputation of left foot (HCC)   2. Non-pressure chronic ulcer of other part of left foot limited to breakdown of skin (HCC)     Plan: Ulcer was debrided continue with protective shoe wear and modified orthotics.  Discussed that this is pressure related.  Follow-Up Instructions: Return in about 4 weeks (around 11/14/2021).   Ortho Exam  Patient is alert, oriented, no adenopathy, well-dressed, normal affect, normal respiratory effort. Examination patient's foot is plantigrade with dorsiflexion to neutral.  He has a persistent ulcer beneath the first metatarsal.  He has good offloading orthotics.  After informed consent a 10 blade knife was used debride the skin and soft tissue back to healthy viable granulation tissue.  The ulcer before debridement is 1 cm in diameter after debridement 3 cm in diameter 1 mm deep with healthy granulation tissue no exposed bone or tendon.  Imaging: No results found. No images are attached to the encounter.  Labs: Lab Results  Component Value Date   HGBA1C 10.2 (A) 07/07/2019   HGBA1C 9.6 (A) 10/02/2018   HGBA1C 11.1 (H) 07/29/2018   ESRSEDRATE 3 07/07/2019   ESRSEDRATE 69 (H) 10/06/2018   ESRSEDRATE 74 (H) 10/05/2018   CRP 13.7 (H) 10/06/2018   CRP 19.7 (H) 10/05/2018   CRP 19.7 (H) 10/04/2018   REPTSTATUS 02/04/2020 FINAL 01/30/2020    GRAMSTAIN  01/30/2020    RARE WBC PRESENT, PREDOMINANTLY PMN RARE GRAM POSITIVE COCCI    CULT  01/30/2020    FEW ENTEROBACTER CLOACAE FEW ENTEROCOCCUS FAECALIS NO ANAEROBES ISOLATED Performed at [facility], [address], [place], [place] [address]    LABORGA ENTEROBACTER CLOACAE 01/30/2020   LABORGA ENTEROCOCCUS FAECALIS 01/30/2020     Lab Results  Component Value Date   ALBUMIN 3.9 10/03/2018   ALBUMIN 4.7 08/05/2018   ALBUMIN 3.9 07/29/2018   PREALBUMIN 9.6 (L) 10/04/2018    No results found for: "MG" No results found for: "VD25OH"  Lab Results  Component Value Date   PREALBUMIN 9.6 (L) 10/04/2018      Latest Ref Rng & Units 10/22/2020    5:50 AM 01/27/2020    7:41 AM 10/10/2018    6:48 AM  CBC EXTENDED  WBC 4.0 - 10.5 K/uL 7.3  5.8  9.5   RBC 4.22 - 5.81 MIL/uL 5.01  4.70  3.74   Hemoglobin 13.0 - 17.0 g/dL 71.6  96.7  89.3   HCT 39.0 - 52.0 % 43.2  41.4  32.7   Platelets 150 - 400 K/uL 216  343  305      There is no height or weight on file to calculate BMI.  Orders:  No orders of the defined types were placed  in this encounter.  No orders of the defined types were placed in this encounter.    Procedures: No procedures performed  Clinical Data: No additional findings.  ROS:  All other systems negative, except as noted in the HPI. Review of Systems  Objective: Vital Signs: There were no vitals taken for this visit.  Specialty Comments:  No specialty comments available.  PMFS History: Patient Active Problem List   Diagnosis Date Noted   Subacute osteomyelitis, right ankle and foot (HCC)    Osteomyelitis of great toe of right foot (HCC)    History of amputation of lesser toe of left foot (HCC) 10/16/2018   Subacute osteomyelitis, left ankle and foot (HCC)    Diabetic infection of left foot (HCC) 10/04/2018   Diabetic infection of right foot (HCC) 10/04/2018   Diabetic polyneuropathy associated with type 2 diabetes mellitus (HCC)     Past Medical History:  Diagnosis Date   Diabetes mellitus without complication (HCC)    Type II   Infection 10/04/2018   LEFT FOOT   Osteomyelitis of great toe of left foot (HCC) 07/29/2018   Subacute osteomyelitis, left ankle and foot (HCC)     Family History  Problem Relation Age of Onset   Diabetes Mother     Past Surgical History:  Procedure Laterality Date   AMPUTATION Left 07/31/2018   Procedure: LEFT GREAT TOE AMPUTATION;  Surgeon: [name], MD;  Location: [facility] OR;  Service: Orthopedics;  Laterality: Left;   AMPUTATION Left 10/09/2018   Procedure: LEFT FOOT 2ND TOE AND POSSIBLE 3RD TOE AMPUTATION;  Surgeon: [name], MD;  Location[facility] OR;  Service: Orthopedics;  Laterality: Left;   AMPUTATION Right 03/17/2020   Procedure: RIGHT GREAT TOE AMPUTATION THROUGH METATARSAL;  Surgeon: [name], MD;  Location: [facility] OR;  Service: Orthopedics;  Laterality: Right;   AMPUTATION Left 10/22/2020   Procedure: LEFT TRANSMETATARSAL AMPUTATION;  Surgeon: [name], MD;  [facility] OR;  Service: Orthopedics;  Laterality: Left;   APPLICATION OF WOUND VAC Right 03/17/2020   Procedure: APPLICATION OF WOUND VAC;  Surgeon: [name], MD;  Location: [facility] OR;  Service: Orthopedics;  Laterality: Right;   I & D EXTREMITY Right 01/30/2020   Procedure: DEBRIDEMENT ABSCESS RIGHT FOOT;  Surgeon: [name], MD;  Location: [facility] OR;  Service: Orthopedics;  Laterality: Right;   I & D EXTREMITY Right 04/07/2020   Procedure: PARTIAL TARSAL EXCISION RIGHT FOOT;  Surgeon: [name], MD;  Location: [facility] OR;  Service: Orthopedics;  Laterality: Right;   Social History   Occupational History   Not on file  Tobacco Use   Smoking status: Former    Years: 27.00    Types: Cigarettes    Quit date: 2007    Years since quitting: 16.4   Smokeless tobacco: Never   Tobacco comments:    03/16/20- quit 12- 15 years ago  Vaping Use   Vaping Use: Never used  Substance and Sexual Activity    Alcohol use: Yes    Comment: ocassionaly 1 beer   Drug use: Never   Sexual activity: Not on file

## 2021-11-14 ENCOUNTER — Ambulatory Visit: Payer: Self-pay | Admitting: Orthopedic Surgery

## 2021-11-28 ENCOUNTER — Ambulatory Visit: Payer: Self-pay | Admitting: Orthopedic Surgery

## 2021-12-19 ENCOUNTER — Ambulatory Visit (INDEPENDENT_AMBULATORY_CARE_PROVIDER_SITE_OTHER): Payer: Self-pay | Admitting: Orthopedic Surgery

## 2021-12-19 ENCOUNTER — Encounter: Payer: Self-pay | Admitting: Orthopedic Surgery

## 2021-12-19 DIAGNOSIS — Z89432 Acquired absence of left foot: Secondary | ICD-10-CM

## 2021-12-19 DIAGNOSIS — L97521 Non-pressure chronic ulcer of other part of left foot limited to breakdown of skin: Secondary | ICD-10-CM

## 2021-12-19 NOTE — Progress Notes (Signed)
Office Visit Note   Patient: Jon House           Date of Birth: 08/23/1970           MRN: [id number] Visit Date: 12/19/2021              Requested by: No referring provider defined for this encounter. PCP: Patient, No Pcp Per  Chief Complaint  Patient presents with   Left Foot - Follow-up    Hx left transmet amputation 10/22/20      HPI: Patient is a 51 year old gentleman who is over a year status post left transmetatarsal amputation.  Patient has modified orthotics but not wearing them today.  Patient states he still has an ulcer beneath the first metatarsal.  Assessment & Plan: Visit Diagnoses:  1. History of transmetatarsal amputation of left foot (HCC)   2. Non-pressure chronic ulcer of other part of left foot limited to breakdown of skin (HCC)     Plan: Patient is given felt relieving donuts to unload pressure from the ulcerative area.  Follow-Up Instructions: Return in about 4 weeks (around 01/16/2022).   Ortho Exam  Patient is alert, oriented, no adenopathy, well-dressed, normal affect, normal respiratory effort. Examination patient has good dorsiflexion of the ankle there is no redness no cellulitis no signs of infection there is a small healing ulcer beneath the first metatarsal it is currently 3 mm in diameter and 1 mm deep.  Healthy granulation tissue.  No new hemoglobin A1c last hemoglobin A1c was 10.2, 2 years ago.    Imaging: No results found. No images are attached to the encounter.  Labs: Lab Results  Component Value Date   HGBA1C 10.2 (A) 07/07/2019   HGBA1C 9.6 (A) 10/02/2018   HGBA1C 11.1 (H) 07/29/2018   ESRSEDRATE 3 07/07/2019   ESRSEDRATE 69 (H) 10/06/2018   ESRSEDRATE 74 (H) 10/05/2018   CRP 13.7 (H) 10/06/2018   CRP 19.7 (H) 10/05/2018   CRP 19.7 (H) 10/04/2018   REPTSTATUS 02/04/2020 FINAL 01/30/2020   GRAMSTAIN  01/30/2020    RARE WBC PRESENT, PREDOMINANTLY PMN RARE GRAM POSITIVE COCCI    CULT  01/30/2020    FEW  ENTEROBACTER CLOACAE FEW ENTEROCOCCUS FAECALIS NO ANAEROBES ISOLATED Performed at [facility], [address], [place], [place] [address]    LABORGA ENTEROBACTER CLOACAE 01/30/2020   LABORGA ENTEROCOCCUS FAECALIS 01/30/2020     Lab Results  Component Value Date   ALBUMIN 3.9 10/03/2018   ALBUMIN 4.7 08/05/2018   ALBUMIN 3.9 07/29/2018   PREALBUMIN 9.6 (L) 10/04/2018    No results found for: "MG" No results found for: "VD25OH"  Lab Results  Component Value Date   PREALBUMIN 9.6 (L) 10/04/2018      Latest Ref Rng & Units 10/22/2020    5:50 AM 01/27/2020    7:41 AM 10/10/2018    6:48 AM  CBC EXTENDED  WBC 4.0 - 10.5 K/uL 7.3  5.8  9.5   RBC 4.22 - 5.81 MIL/uL 5.01  4.70  3.74   Hemoglobin 13.0 - 17.0 g/dL 78.2  42.3  53.6   HCT 39.0 - 52.0 % 43.2  41.4  32.7   Platelets 150 - 400 K/uL 216  343  305      There is no height or weight on file to calculate BMI.  Orders:  No orders of the defined types were placed in this encounter.  No orders of the defined types were placed in this encounter.    Procedures:  No procedures performed  Clinical Data: No additional findings.  ROS:  All other systems negative, except as noted in the HPI. Review of Systems  Objective: Vital Signs: There were no vitals taken for this visit.  Specialty Comments:  No specialty comments available.  PMFS History: Patient Active Problem List   Diagnosis Date Noted   Subacute osteomyelitis, right ankle and foot (HCC)    Osteomyelitis of great toe of right foot (HCC)    History of amputation of lesser toe of left foot (HCC) 10/16/2018   Subacute osteomyelitis, left ankle and foot (HCC)    Diabetic infection of left foot (HCC) 10/04/2018   Diabetic infection of right foot (HCC) 10/04/2018   Diabetic polyneuropathy associated with type 2 diabetes mellitus (HCC)    Past Medical History:  Diagnosis Date   Diabetes mellitus without complication (HCC)    Type II   Infection  10/04/2018   LEFT FOOT   Osteomyelitis of great toe of left foot (HCC) 07/29/2018   Subacute osteomyelitis, left ankle and foot (HCC)     Family History  Problem Relation Age of Onset   Diabetes Mother     Past Surgical History:  Procedure Laterality Date   AMPUTATION Left 07/31/2018   Procedure: LEFT GREAT TOE AMPUTATION;  Surgeon: [name], MD;  Location: [facility] OR;  Service: Orthopedics;  Laterality: Left;   AMPUTATION Left 10/09/2018   Procedure: LEFT FOOT 2ND TOE AND POSSIBLE 3RD TOE AMPUTATION;  Surgeon: [name], MD;  Location: [facility] OR;  Service: Orthopedics;  Laterality: Left;   AMPUTATION Right 03/17/2020   Procedure: RIGHT GREAT TOE AMPUTATION THROUGH METATARSAL;  Surgeon: [name], MD;  Location: [facility] OR;  Service: Orthopedics;  Laterality: Right;   AMPUTATION Left 10/22/2020   Procedure: LEFT TRANSMETATARSAL AMPUTATION;  Surgeon: [name], MD;  Location: [facility] OR;  Service: Orthopedics;  Laterality: Left;   APPLICATION OF WOUND VAC Right 03/17/2020   Procedure: APPLICATION OF WOUND VAC;  Surgeon: [name], MD;  Location: [facility] OR;  Service: Orthopedics;  Laterality: Right;   I & D EXTREMITY Right 01/30/2020   Procedure: DEBRIDEMENT ABSCESS RIGHT FOOT;  Surgeon: [name], MD;  Location: [facility] OR;  Service: Orthopedics;  Laterality: Right;   I & D EXTREMITY Right 04/07/2020   Procedure: PARTIAL TARSAL EXCISION RIGHT FOOT;  Surgeon: [name], MD;  Location: [facility] OR;  Service: Orthopedics;  Laterality: Right;   Social History   Occupational History   Not on file  Tobacco Use   Smoking status: Former    Years: 27.00    Types: Cigarettes    Quit date: 2007    Years since quitting: 16.6   Smokeless tobacco: Never   Tobacco comments:    03/16/20- quit 12- 15 years ago  Vaping Use   Vaping Use: Never used  Substance and Sexual Activity   Alcohol use: Yes    Comment: ocassionaly 1 beer   Drug use: Never   Sexual activity: Not on file

## 2022-01-16 ENCOUNTER — Ambulatory Visit: Payer: Self-pay | Admitting: Orthopedic Surgery

## 2022-01-30 ENCOUNTER — Ambulatory Visit (INDEPENDENT_AMBULATORY_CARE_PROVIDER_SITE_OTHER): Payer: Self-pay | Admitting: Orthopedic Surgery

## 2022-01-30 ENCOUNTER — Encounter: Payer: Self-pay | Admitting: Orthopedic Surgery

## 2022-01-30 DIAGNOSIS — Z89432 Acquired absence of left foot: Secondary | ICD-10-CM

## 2022-01-30 NOTE — Progress Notes (Signed)
Office Visit Note   Patient: Jon House           Date of Birth: [birth year]           MRN: [id number] Visit Date: 01/30/2022              Requested by: No referring provider defined for this encounter. PCP: Patient, No Pcp Per  Chief Complaint  Patient presents with   Left Foot - Routine Post Op      HPI: Patient is a 51 year old gentleman who is status post left transmetatarsal amputation.  He has had a persistent ulceration beneath the first metatarsal.  Patient is over 3 months out from surgery.  Assessment & Plan: Visit Diagnoses:  1. History of transmetatarsal amputation of left foot ([place])     Plan: Ulcer was debrided continue with protective shoe wear and minimize weightbearing  Follow-Up Instructions: Return in about 4 weeks (around 02/27/2022).   Ortho Exam  Patient is alert, oriented, no adenopathy, well-dressed, normal affect, normal respiratory effort. Examination patient has increased callus around the ulcer of the first metatarsal.  There is no cellulitis no odor no drainage.  After informed consent a 10 blade knife was used to debride the skin and soft tissue back to healthy viable tissue.  After debridement the ulcer is 2 cm in diameter and 1 mm deep.  Imaging: No results found. No images are attached to the encounter.  Labs: Lab Results  Component Value Date   HGBA1C 10.2 (A) 07/07/2019   HGBA1C 9.6 (A) 10/02/2018   HGBA1C 11.1 (H) 07/29/2018   ESRSEDRATE 3 07/07/2019   ESRSEDRATE 69 (H) 10/06/2018   ESRSEDRATE 74 (H) 10/05/2018   CRP 13.7 (H) 10/06/2018   CRP 19.7 (H) 10/05/2018   CRP 19.7 (H) 10/04/2018   REPTSTATUS 02/04/2020 FINAL 01/30/2020   GRAMSTAIN  01/30/2020    RARE WBC PRESENT, PREDOMINANTLY PMN RARE GRAM POSITIVE COCCI    CULT  01/30/2020    FEW ENTEROBACTER CLOACAE FEW ENTEROCOCCUS FAECALIS NO ANAEROBES ISOLATED Performed at [facility], [address], [place], [place] [address]    LABORGA ENTEROBACTER  CLOACAE 01/30/2020   LABORGA ENTEROCOCCUS FAECALIS 01/30/2020     Lab Results  Component Value Date   ALBUMIN 3.9 10/03/2018   ALBUMIN 4.7 08/05/2018   ALBUMIN 3.9 07/29/2018   PREALBUMIN 9.6 (L) 10/04/2018    No results found for: "MG" No results found for: "VD25OH"  Lab Results  Component Value Date   PREALBUMIN 9.6 (L) 10/04/2018      Latest Ref Rng & Units 10/22/2020    5:50 AM 01/27/2020    7:41 AM 10/10/2018    6:48 AM  CBC EXTENDED  WBC 4.0 - 10.5 K/uL 7.3  5.8  9.5   RBC 4.22 - 5.81 MIL/uL 5.01  4.70  3.74   Hemoglobin 13.0 - 17.0 g/dL 14.6  13.8  11.2   HCT 39.0 - 52.0 % 43.2  41.4  32.7   Platelets 150 - 400 K/uL 216  343  305      There is no height or weight on file to calculate BMI.  Orders:  No orders of the defined types were placed in this encounter.  No orders of the defined types were placed in this encounter.    Procedures: No procedures performed  Clinical Data: No additional findings.  ROS:  All other systems negative, except as noted in the HPI. Review of Systems  Objective: Vital Signs: There were no  vitals taken for this visit.  Specialty Comments:  No specialty comments available.  PMFS History: Patient Active Problem List   Diagnosis Date Noted   Subacute osteomyelitis, right ankle and foot (HCC)    Osteomyelitis of great toe of right foot (HCC)    History of amputation of lesser toe of left foot (HCC) 10/16/2018   Subacute osteomyelitis, left ankle and foot (HCC)    Diabetic infection of left foot (HCC) 10/04/2018   Diabetic infection of right foot (HCC) 10/04/2018   Diabetic polyneuropathy associated with type 2 diabetes mellitus (HCC)    Past Medical History:  Diagnosis Date   Diabetes mellitus without complication (HCC)    Type II   Infection 10/04/2018   LEFT FOOT   Osteomyelitis of great toe of left foot (HCC) 07/29/2018   Subacute osteomyelitis, left ankle and foot (HCC)     Family History  Problem Relation  Age of Onset   Diabetes Mother     Past Surgical History:  Procedure Laterality Date   AMPUTATION Left 07/31/2018   Procedure: LEFT GREAT TOE AMPUTATION;  Surgeon: [name], MD;  Location: [facility] OR;  Service: Orthopedics;  Laterality: Left;   AMPUTATION Left 10/09/2018   Procedure: LEFT FOOT 2ND TOE AND POSSIBLE 3RD TOE AMPUTATION;  Surgeon: [name], MD;  Location: [facility] OR;  Service: Orthopedics;  Laterality: Left;   AMPUTATION Right 03/17/2020   Procedure: RIGHT GREAT TOE AMPUTATION THROUGH METATARSAL;  Surgeon: [name], MD;  Location: [facility] OR;  Service: Orthopedics;  Laterality: Right;   AMPUTATION Left 10/22/2020   Procedure: LEFT TRANSMETATARSAL AMPUTATION;  Surgeon: [name], MD;  Location: [facility] OR;  Service: Orthopedics;  Laterality: Left;   APPLICATION OF WOUND VAC Right 03/17/2020   Procedure: APPLICATION OF WOUND VAC;  Surgeon: [name], MD;  Location: [facility] OR;  Service: Orthopedics;  Laterality: Right;   I & D EXTREMITY Right 01/30/2020   Procedure: DEBRIDEMENT ABSCESS RIGHT FOOT;  Surgeon: [name], MD;  Location: [facility] OR;  Service: Orthopedics;  Laterality: Right;   I & D EXTREMITY Right 04/07/2020   Procedure: PARTIAL TARSAL EXCISION RIGHT FOOT;  Surgeon: [name], MD;  Location: [facility] OR;  Service: Orthopedics;  Laterality: Right;   Social History   Occupational History   Not on file  Tobacco Use   Smoking status: Former    Years: 27.00    Types: Cigarettes    Quit date: 2007    Years since quitting: 16.7   Smokeless tobacco: Never   Tobacco comments:    03/16/20- quit 12- 15 years ago  Vaping Use   Vaping Use: Never used  Substance and Sexual Activity   Alcohol use: Yes    Comment: ocassionaly 1 beer   Drug use: Never   Sexual activity: Not on file

## 2022-02-27 ENCOUNTER — Ambulatory Visit: Payer: Self-pay | Admitting: Orthopedic Surgery

## 2022-08-10 ENCOUNTER — Ambulatory Visit (INDEPENDENT_AMBULATORY_CARE_PROVIDER_SITE_OTHER): Payer: Self-pay | Admitting: Orthopedic Surgery

## 2022-08-10 DIAGNOSIS — L97521 Non-pressure chronic ulcer of other part of left foot limited to breakdown of skin: Secondary | ICD-10-CM

## 2022-08-10 DIAGNOSIS — Z89432 Acquired absence of left foot: Secondary | ICD-10-CM

## 2022-08-15 ENCOUNTER — Encounter (HOSPITAL_COMMUNITY): Payer: Self-pay | Admitting: Orthopedic Surgery

## 2022-08-15 NOTE — Progress Notes (Signed)
PCP - none Cardiologist - none  Chest x-ray - n/a EKG - n/a Stress Test - n/a ECHO - n/a Cardiac Cath - n/a  ICD Pacemaker/Loop - n/a  Sleep Study -  n/a CPAP - none  Diabetes Type 2 CBG = Range 120-200s  THE NIGHT BEFORE SURGERY, take 7-14 units (sliding scale) of NPH-R Human 70/30 Insulin.      THE MORNING OF SURGERY, do not take any NPH-R Human 70/30 Insulin.    ERAS: Clear liquids til 5:30 AM DOS  STOP now taking any Aspirin (unless otherwise instructed by your surgeon), Aleve, Naproxen, Ibuprofen, Motrin, Advil, Goody's, BC's, all herbal medications, fish oil, and all vitamins.   Coronavirus Screening Do you have any of the following symptoms:  Cough yes/no: No Fever (>100.34F)  yes/no: No Runny nose yes/no: No Sore throat yes/no: No Difficulty breathing/shortness of breath  yes/no: No  Have you traveled in the last 14 days and where? yes/no: No  Patient verbalized understanding of instructions that were given via phone via Chinita Pester Interpreter ID # [phone].

## 2022-08-16 ENCOUNTER — Ambulatory Visit (HOSPITAL_BASED_OUTPATIENT_CLINIC_OR_DEPARTMENT_OTHER): Payer: Self-pay | Admitting: Certified Registered"

## 2022-08-16 ENCOUNTER — Ambulatory Visit (HOSPITAL_COMMUNITY): Payer: Self-pay | Admitting: Certified Registered"

## 2022-08-16 ENCOUNTER — Other Ambulatory Visit: Payer: Self-pay

## 2022-08-16 ENCOUNTER — Encounter (HOSPITAL_COMMUNITY): Payer: Self-pay | Admitting: Orthopedic Surgery

## 2022-08-16 ENCOUNTER — Ambulatory Visit (HOSPITAL_COMMUNITY)
Admission: RE | Admit: 2022-08-16 | Discharge: 2022-08-16 | Disposition: A | Discharge status: Home / Self Care | Payer: Self-pay | Attending: Orthopedic Surgery | Admitting: Orthopedic Surgery

## 2022-08-16 ENCOUNTER — Encounter (HOSPITAL_COMMUNITY)
Admission: RE | Disposition: A | Discharge status: Home / Self Care | Payer: Self-pay | Source: Home / Self Care | Attending: Orthopedic Surgery

## 2022-08-16 DIAGNOSIS — E1169 Type 2 diabetes mellitus with other specified complication: Secondary | ICD-10-CM | POA: Insufficient documentation

## 2022-08-16 DIAGNOSIS — T8781 Dehiscence of amputation stump: Secondary | ICD-10-CM | POA: Insufficient documentation

## 2022-08-16 DIAGNOSIS — M86272 Subacute osteomyelitis, left ankle and foot: Secondary | ICD-10-CM

## 2022-08-16 DIAGNOSIS — M86271 Subacute osteomyelitis, right ankle and foot: Secondary | ICD-10-CM

## 2022-08-16 DIAGNOSIS — M869 Osteomyelitis, unspecified: Secondary | ICD-10-CM | POA: Insufficient documentation

## 2022-08-16 DIAGNOSIS — E1151 Type 2 diabetes mellitus with diabetic peripheral angiopathy without gangrene: Secondary | ICD-10-CM

## 2022-08-16 DIAGNOSIS — Z87891 Personal history of nicotine dependence: Secondary | ICD-10-CM | POA: Insufficient documentation

## 2022-08-16 DIAGNOSIS — E1165 Type 2 diabetes mellitus with hyperglycemia: Secondary | ICD-10-CM | POA: Insufficient documentation

## 2022-08-16 HISTORY — PX: STUMP REVISION: SHX6102

## 2022-08-16 LAB — BASIC METABOLIC PANEL
Anion gap: 8 (ref 5–15)
BUN: 17 mg/dL (ref 6–20)
CO2: 27 mmol/L (ref 22–32)
Calcium: 8.4 mg/dL — ABNORMAL LOW (ref 8.9–10.3)
Chloride: 97 mmol/L — ABNORMAL LOW (ref 98–111)
Creatinine, Ser: 0.73 mg/dL (ref 0.61–1.24)
GFR, Estimated: >60 mL/min (ref >60)
Glucose, Bld: 273 mg/dL — ABNORMAL HIGH (ref 70–99)
Potassium: 3.6 mmol/L (ref 3.5–5.1)
Sodium: 132 mmol/L — ABNORMAL LOW (ref 135–145)

## 2022-08-16 LAB — CBC
HCT: 38.5 % — ABNORMAL LOW (ref 39.0–52.0)
Hemoglobin: 13.6 g/dL (ref 13.0–17.0)
MCH: 30.8 pg (ref 26.0–34.0)
MCHC: 35.3 g/dL (ref 30.0–36.0)
MCV: 87.1 fL (ref 80.0–100.0)
Platelets: 222 10*3/uL (ref 150–400)
RBC: 4.42 MIL/uL (ref 4.22–5.81)
RDW: 12.1 % (ref 11.5–15.5)
WBC: 6.6 10*3/uL (ref 4.0–10.5)
nRBC: 0 % (ref 0.0–0.2)

## 2022-08-16 LAB — GLUCOSE, CAPILLARY
Glucose-Capillary: 278 mg/dL — ABNORMAL HIGH (ref 70–99)
Glucose-Capillary: 212 mg/dL — ABNORMAL HIGH (ref 70–99)

## 2022-08-16 SURGERY — REVISION, AMPUTATION SITE
Anesthesia: General | Site: Foot | Laterality: Left

## 2022-08-16 MED ORDER — FENTANYL CITRATE (PF) 100 MCG/2ML IJ SOLN: 25.0000 ug | INTRAMUSCULAR | Status: DC | PRN

## 2022-08-16 MED ORDER — ONDANSETRON HCL 4 MG/2ML IJ SOLN: 4.0000 mg | Freq: Once | INTRAMUSCULAR | Status: DC | PRN

## 2022-08-16 MED ORDER — LIDOCAINE 2% (20 MG/ML) 5 ML SYRINGE
INTRAMUSCULAR | Status: AC
  Filled 2022-08-16: qty 5

## 2022-08-16 MED ORDER — PROPOFOL 10 MG/ML IV BOLUS
INTRAVENOUS | Status: DC | PRN
  Administered 2022-08-16: 20 mg via INTRAVENOUS
  Administered 2022-08-16: 10 mg via INTRAVENOUS
  Administered 2022-08-16: 150 mg via INTRAVENOUS
  Administered 2022-08-16: 10 mg via INTRAVENOUS

## 2022-08-16 MED ORDER — MIDAZOLAM HCL 2 MG/2ML IJ SOLN
INTRAMUSCULAR | Status: AC
  Filled 2022-08-16: qty 2

## 2022-08-16 MED ORDER — PROPOFOL 10 MG/ML IV BOLUS
INTRAVENOUS | Status: AC
  Filled 2022-08-16: qty 20

## 2022-08-16 MED ORDER — OXYCODONE HCL 5 MG/5ML PO SOLN: 5.0000 mg | Freq: Once | ORAL | Status: DC | PRN

## 2022-08-16 MED ORDER — ONDANSETRON HCL 4 MG/2ML IJ SOLN
INTRAMUSCULAR | Status: DC | PRN
  Administered 2022-08-16: 4 mg via INTRAVENOUS

## 2022-08-16 MED ORDER — ACETAMINOPHEN 10 MG/ML IV SOLN: 1000.0000 mg | Freq: Once | INTRAVENOUS | Status: DC | PRN

## 2022-08-16 MED ORDER — FENTANYL CITRATE (PF) 100 MCG/2ML IJ SOLN
INTRAMUSCULAR | Status: AC
  Filled 2022-08-16: qty 2

## 2022-08-16 MED ORDER — OXYCODONE HCL 5 MG PO TABS: 5.0000 mg | ORAL_TABLET | Freq: Once | ORAL | Status: DC | PRN

## 2022-08-16 MED ORDER — OXYCODONE-ACETAMINOPHEN 5-325 MG PO TABS: 1.0000 | ORAL_TABLET | ORAL | 0 refills | Status: DC | PRN

## 2022-08-16 MED ORDER — LACTATED RINGERS IV SOLN: INTRAVENOUS | Status: DC

## 2022-08-16 MED ORDER — 0.9 % SODIUM CHLORIDE (POUR BTL) OPTIME
TOPICAL | Status: DC | PRN
  Administered 2022-08-16: 1000 mL

## 2022-08-16 MED ORDER — CHLORHEXIDINE GLUCONATE 0.12 % MT SOLN
15.0000 mL | Freq: Once | OROMUCOSAL | Status: AC
  Administered 2022-08-16: 15 mL via OROMUCOSAL
  Filled 2022-08-16: qty 15

## 2022-08-16 MED ORDER — INSULIN ASPART 100 UNIT/ML IJ SOLN
0.0000 [IU] | INTRAMUSCULAR | Status: DC | PRN
  Administered 2022-08-16: 8 [IU] via SUBCUTANEOUS
  Filled 2022-08-16: qty 1

## 2022-08-16 MED ORDER — EPHEDRINE SULFATE-NACL 50-0.9 MG/10ML-% IV SOSY
PREFILLED_SYRINGE | INTRAVENOUS | Status: DC | PRN
  Administered 2022-08-16: 5 mg via INTRAVENOUS

## 2022-08-16 MED ORDER — ORAL CARE MOUTH RINSE: 15.0000 mL | Freq: Once | OROMUCOSAL | Status: AC

## 2022-08-16 MED ORDER — MIDAZOLAM HCL 5 MG/5ML IJ SOLN
INTRAMUSCULAR | Status: DC | PRN
  Administered 2022-08-16: 2 mg via INTRAVENOUS

## 2022-08-16 MED ORDER — CEFAZOLIN SODIUM-DEXTROSE 2-4 GM/100ML-% IV SOLN
2.0000 g | INTRAVENOUS | Status: AC
  Administered 2022-08-16: 2 g via INTRAVENOUS
  Filled 2022-08-16: qty 100

## 2022-08-16 MED ORDER — FENTANYL CITRATE (PF) 100 MCG/2ML IJ SOLN
INTRAMUSCULAR | Status: DC | PRN
  Administered 2022-08-16: 25 ug via INTRAVENOUS

## 2022-08-16 MED ORDER — LIDOCAINE 2% (20 MG/ML) 5 ML SYRINGE
INTRAMUSCULAR | Status: DC | PRN
  Administered 2022-08-16: 100 mg via INTRAVENOUS

## 2022-08-16 SURGICAL SUPPLY — 38 items
BAG COUNTER SPONGE SURGICOUNT (BAG) ×1 IMPLANT
BAG SPNG CNTER NS LX DISP (BAG) ×1
BLADE SAW RECIP 87.9 MT (BLADE) IMPLANT
BLADE SURG 21 STRL SS (BLADE) ×1 IMPLANT
BNDG COHESIVE 6X5 TAN NS LF (GAUZE/BANDAGES/DRESSINGS) IMPLANT
BNDG GAUZE DERMACEA FLUFF 4 (GAUZE/BANDAGES/DRESSINGS) IMPLANT
BNDG GZE DERMACEA 4 6PLY (GAUZE/BANDAGES/DRESSINGS) ×1
CANISTER WOUND CARE 500ML ATS (WOUND CARE) ×1 IMPLANT
COVER SURGICAL LIGHT HANDLE (MISCELLANEOUS) ×1 IMPLANT
DRAPE EXTREMITY T 121X128X90 (DISPOSABLE) ×1 IMPLANT
DRAPE HALF SHEET 40X57 (DRAPES) ×1 IMPLANT
DRAPE INCISE IOBAN 66X45 STRL (DRAPES) ×1 IMPLANT
DRAPE U-SHAPE 47X51 STRL (DRAPES) ×2 IMPLANT
DRESSING PREVENA PLUS CUSTOM (GAUZE/BANDAGES/DRESSINGS) ×1 IMPLANT
DRSG ADAPTIC 3X8 NADH LF (GAUZE/BANDAGES/DRESSINGS) IMPLANT
DRSG PREVENA PLUS CUSTOM (GAUZE/BANDAGES/DRESSINGS)
DURAPREP 26ML APPLICATOR (WOUND CARE) ×1 IMPLANT
ELECT REM PT RETURN 9FT ADLT (ELECTROSURGICAL) ×1
ELECTRODE REM PT RTRN 9FT ADLT (ELECTROSURGICAL) ×1 IMPLANT
GAUZE PAD ABD 8X10 STRL (GAUZE/BANDAGES/DRESSINGS) IMPLANT
GAUZE SPONGE 4X4 12PLY STRL (GAUZE/BANDAGES/DRESSINGS) IMPLANT
GLOVE BIOGEL PI IND STRL 9 (GLOVE) ×1 IMPLANT
GLOVE SURG ORTHO 9.0 STRL STRW (GLOVE) ×1 IMPLANT
GOWN STRL REUS W/ TWL XL LVL3 (GOWN DISPOSABLE) ×2 IMPLANT
GOWN STRL REUS W/TWL XL LVL3 (GOWN DISPOSABLE) ×1
GRAFT SKIN WND MICRO 38 (Tissue) IMPLANT
KIT BASIN OR (CUSTOM PROCEDURE TRAY) ×1 IMPLANT
KIT TURNOVER KIT B (KITS) ×1 IMPLANT
MANIFOLD NEPTUNE II (INSTRUMENTS) ×1 IMPLANT
NS IRRIG 1000ML POUR BTL (IV SOLUTION) ×1 IMPLANT
PACK GENERAL/GYN (CUSTOM PROCEDURE TRAY) ×1 IMPLANT
PAD ARMBOARD 7.5X6 YLW CONV (MISCELLANEOUS) ×1 IMPLANT
PREVENA RESTOR ARTHOFORM 46X30 (CANNISTER) ×1 IMPLANT
STAPLER VISISTAT 35W (STAPLE) IMPLANT
SUT ETHILON 2 0 PSLX (SUTURE) ×2 IMPLANT
SUT SILK 2 0 (SUTURE)
SUT SILK 2-0 18XBRD TIE 12 (SUTURE) IMPLANT
TOWEL GREEN STERILE (TOWEL DISPOSABLE) ×1 IMPLANT

## 2022-08-16 NOTE — Anesthesia Preprocedure Evaluation (Signed)
Anesthesia Evaluation  Patient identified by MRN, date of birth, ID band Patient awake    Reviewed: Allergy & Precautions, H&P , NPO status , Patient's Chart, lab work & pertinent test results  Airway Mallampati: II  TM Distance: >3 FB Neck ROM: Full    Dental no notable dental hx.    Pulmonary neg pulmonary ROS, former smoker   Pulmonary exam normal breath sounds clear to auscultation       Cardiovascular + Peripheral Vascular Disease  Normal cardiovascular exam Rhythm:Regular Rate:Normal     Neuro/Psych negative neurological ROS  negative psych ROS   GI/Hepatic negative GI ROS, Neg liver ROS,,,  Endo/Other  diabetes, Poorly Controlled, Type 2    Renal/GU negative Renal ROS  negative genitourinary   Musculoskeletal negative musculoskeletal ROS (+)    Abdominal   Peds negative pediatric ROS (+)  Hematology negative hematology ROS (+)   Anesthesia Other Findings   Reproductive/Obstetrics negative OB ROS                             Anesthesia Physical Anesthesia Plan  ASA: 3  Anesthesia Plan: General   Post-op Pain Management: Minimal or no pain anticipated   Induction: Intravenous  PONV Risk Score and Plan: 2 and Ondansetron and Treatment may vary due to age or medical condition  Airway Management Planned: LMA  Additional Equipment:   Intra-op Plan:   Post-operative Plan: Extubation in OR  Informed Consent: I have reviewed the patients History and Physical, chart, labs and discussed the procedure including the risks, benefits and alternatives for the proposed anesthesia with the patient or authorized representative who has indicated his/her understanding and acceptance.     Dental advisory given  Plan Discussed with: CRNA and Surgeon  Anesthesia Plan Comments:        Anesthesia Quick Evaluation

## 2022-08-16 NOTE — Transfer of Care (Signed)
Immediate Anesthesia Transfer of Care Note  Patient: Jon House  Procedure(s) Performed: REVISION LEFT TRANSMETATARSAL AMPUTATION (Left: Foot)  Patient Location: PACU  Anesthesia Type:General  Level of Consciousness: drowsy  Airway & Oxygen Therapy: Patient Spontanous Breathing and Patient connected to nasal cannula oxygen  Post-op Assessment: Report given to RN and Post -op Vital signs reviewed and stable  Post vital signs: Reviewed and stable  Last Vitals:  Vitals Value Taken Time  BP 120/76 08/16/22 0908  Temp    Pulse 74 08/16/22 0910  Resp 14 08/16/22 0910  SpO2 99 % 08/16/22 0910  Vitals shown include unvalidated device data.  Last Pain:  Vitals:   08/16/22 0713  TempSrc:   PainSc: 0-No pain      Patients Stated Pain Goal: 0 (08/16/22 0713)  Complications: No notable events documented.

## 2022-08-16 NOTE — H&P (Signed)
Jon House is an 52 y.o. male.   Chief Complaint: Ulceration dehiscence left transmetatarsal amputation. HPI: Patient is a 52 year old gentleman who is status post left transmetatarsal amputation. He has had a persistent ulceration beneath the first metatarsal. Patient is over 3 months out from surgery.   Past Medical History:  Diagnosis Date   Diabetes mellitus without complication    Type II   Infection 10/04/2018   LEFT FOOT   Osteomyelitis of great toe of left foot 07/29/2018   Subacute osteomyelitis, left ankle and foot     Past Surgical History:  Procedure Laterality Date   AMPUTATION Left 07/31/2018   Procedure: LEFT GREAT TOE AMPUTATION;  Surgeon: [name], MD;  Location: [facility] OR;  Service: Orthopedics;  Laterality: Left;   AMPUTATION Left 10/09/2018   Procedure: LEFT FOOT 2ND TOE AND POSSIBLE 3RD TOE AMPUTATION;  Surgeon: [name], MD;  Location: [facility] OR;  Service: Orthopedics;  Laterality: Left;   AMPUTATION Right 03/17/2020   Procedure: RIGHT GREAT TOE AMPUTATION THROUGH METATARSAL;  Surgeon: [name], MD;  Location: [facility] OR;  Service: Orthopedics;  Laterality: Right;   AMPUTATION Left 10/22/2020   Procedure: LEFT TRANSMETATARSAL AMPUTATION;  Surgeon: [name], MD;  Location: [facility] OR;  Service: Orthopedics;  Laterality: Left;   APPLICATION OF WOUND VAC Right 03/17/2020   Procedure: APPLICATION OF WOUND VAC;  Surgeon: [name], MD;  Location: [facility] OR;  Service: Orthopedics;  Laterality: Right;   I & D EXTREMITY Right 01/30/2020   Procedure: DEBRIDEMENT ABSCESS RIGHT FOOT;  Surgeon: [name], MD;  Location: [facility] OR;  Service: Orthopedics;  Laterality: Right;   I & D EXTREMITY Right 04/07/2020   Procedure: PARTIAL TARSAL EXCISION RIGHT FOOT;  Surgeon: [name], MD;  Location: [facility] OR;  Service: Orthopedics;  Laterality: Right;    Family History  Problem Relation Age of Onset   Diabetes Mother    Social History:  reports that he quit smoking  about 17 years ago. His smoking use included cigarettes. He has never used smokeless tobacco. He reports current alcohol use of about 6.0 - 8.0 standard drinks of alcohol per week. He reports that he does not use drugs.  Allergies:  Allergies  Allergen Reactions   Bee Venom Hives   Metformin And Related Diarrhea    Medications Prior to Admission  Medication Sig Dispense Refill   ibuprofen (ADVIL) 200 MG tablet Take 200 mg by mouth every 6 (six) hours as needed for moderate pain.     insulin NPH-regular Human (70-30) 100 UNIT/ML injection Inject 10-20 Units into the skin 2 (two) times daily with a meal.     Omega-3 Fatty Acids (FISH OIL PO) Take 1 capsule by mouth daily.     oxymetazoline (AFRIN ALL NIGHT NODRIP) 0.05 % nasal spray Place 1 spray into both nostrils at bedtime.     Blood Glucose Monitoring Suppl (TRUE METRIX METER) w/Device KIT Use to check blood sugars up to  3 times daily 1 kit 0   glucose blood (TRUE METRIX BLOOD GLUCOSE TEST) test strip Use as instructed to check blood sugars up to 3 x daily 100 each 12   Insulin Pen Needle (B-D UF III MINI PEN NEEDLES) 31G X 5 MM MISC Use as instructed. Inject into the skin once nightly. 100 each prn   TRUEplus Lancets 28G MISC Use to help check blood sugars up to 3 times per day 100 each 11    No results  found for this or any previous visit (from the past 48 hour(s)). No results found.  Review of Systems  All other systems reviewed and are negative.   Height 5' 6.93" (1.7 m), weight 68 kg. Physical Exam  Examination patient is alert oriented no adenopathy well-dressed normal affect normal respiratory effort.  Patient has dehiscence of the left transmetatarsal amputation there is exposed bone. Assessment/Plan Assessment: Dehiscence left transmetatarsal amputation.  Plan: Will plan for revision of the left transmetatarsal amputation.  Risk and benefits were discussed including risk of the wound not healing need for additional  surgery.  Patient states he understands wished to proceed at this time.  [name], MD 08/16/2022, 6:45 AM

## 2022-08-16 NOTE — Anesthesia Postprocedure Evaluation (Signed)
Anesthesia Post Note  Patient: Jon House  Procedure(s) Performed: REVISION LEFT TRANSMETATARSAL AMPUTATION (Left: Foot)     Patient location during evaluation: PACU Anesthesia Type: General Level of consciousness: awake and alert Pain management: pain level controlled Vital Signs Assessment: post-procedure vital signs reviewed and stable Respiratory status: spontaneous breathing, nonlabored ventilation, respiratory function stable and patient connected to nasal cannula oxygen Cardiovascular status: blood pressure returned to baseline and stable Postop Assessment: no apparent nausea or vomiting Anesthetic complications: no  No notable events documented.  Last Vitals:  Vitals:   08/16/22 0915 08/16/22 0930  BP: 117/73 (!) 142/89  Pulse: 76 80  Resp: 17 16  Temp:  36.4 C  SpO2: 97% 95%    Last Pain:  Vitals:   08/16/22 0930  TempSrc:   PainSc: 0-No pain                 [name] S

## 2022-08-16 NOTE — Op Note (Signed)
08/16/2022  9:06 AM  PATIENT:  Jon House    PRE-OPERATIVE DIAGNOSIS:  Osteomyelitis Left 1st Metatarsal with dehiscence of transmetatarsal amputation  POST-OPERATIVE DIAGNOSIS:  Same  PROCEDURE:  REVISION LEFT TRANSMETATARSAL AMPUTATION with show part amputation.  Application of Kerecis micro graft 38 cm.  SURGEON:  [name], MD  PHYSICIAN ASSISTANT:None ANESTHESIA:   General  PREOPERATIVE INDICATIONS:  Jon House is a  52 y.o. male with a diagnosis of Osteomyelitis Left 1st Metatarsal who failed conservative measures and elected for surgical management.    The risks benefits and alternatives were discussed with the patient preoperatively including but not limited to the risks of infection, bleeding, nerve injury, cardiopulmonary complications, the need for revision surgery, among others, and the patient was willing to proceed.  OPERATIVE IMPLANTS:  Kerecis micro graft 38 cm.  * No implants in log *  @  OPERATIVE FINDINGS: Good petechial bleeding calcified arteries tissue margins were clear.  OPERATIVE PROCEDURE: Patient brought the operating room and underwent general anesthetic.  After adequate levels anesthesia were obtained patient's left lower extremity was prepped using DuraPrep draped into a sterile field a timeout was called.  Elliptical incision was made around the wound dehiscence.  This was carried sharply down to bone.  Electrocautery was used for hemostasis the arteries were extremely calcified.  The tissue margins were clear.  A reciprocating saw was used to revise the transmetatarsal amputation to a show part amputation.  The wound was irrigated electrocautery was used for hemostasis.  38 cm of Kerecis micro graft was used to fill a wound that was 10 x 5 cm.  2-0 nylon was used for wound closure.  A dry dressing was applied patient was extubated taken the PACU in stable condition.   DISCHARGE PLANNING:  Antibiotic duration:  Preoperative antibiotics  Weightbearing: Touchdown weightbearing on the left  Pain medication: Prescription for Percocet  Dressing care/ Wound VAC: Dry dressing  Ambulatory devices: Crutches   Discharge to: Home.  Follow-up: In the office 1 week post operative.

## 2022-08-16 NOTE — Anesthesia Procedure Notes (Signed)
Procedure Name: LMA Insertion Date/Time: 08/16/2022 8:29 AM  Performed by: Marny Lowenstein, CRNAPre-anesthesia Checklist: Patient identified, Emergency Drugs available, Suction available and Patient being monitored Patient Re-evaluated:Patient Re-evaluated prior to induction Oxygen Delivery Method: Circle system utilized Preoxygenation: Pre-oxygenation with 100% oxygen Induction Type: IV induction Ventilation: Mask ventilation without difficulty LMA: LMA inserted LMA Size: 4.0 Number of attempts: 1 Placement Confirmation: positive ETCO2 and breath sounds checked- equal and bilateral Tube secured with: Tape Dental Injury: Teeth and Oropharynx as per pre-operative assessment

## 2022-08-17 ENCOUNTER — Encounter (HOSPITAL_COMMUNITY): Payer: Self-pay | Admitting: Orthopedic Surgery

## 2022-08-22 ENCOUNTER — Encounter: Payer: Self-pay | Admitting: Orthopedic Surgery

## 2022-08-22 NOTE — Progress Notes (Signed)
Office Visit Note   Patient: Jon House           Date of Birth: December 09, 1970           MRN: [id number] Visit Date: 08/10/2022              Requested by: No referring provider defined for this encounter. PCP: Patient, No Pcp Per  Chief Complaint  Patient presents with   Left Foot - Injury    Hx left transmet amputation      HPI: Patient is a 52 year old gentleman who is status post a left transmetatarsal amputation patient presents complaining of an ulcer beneath the first metatarsal.  Assessment & Plan: Visit Diagnoses:  1. History of transmetatarsal amputation of left foot   2. Non-pressure chronic ulcer of other part of left foot limited to breakdown of skin     Plan: Patient has an ulcer that probes to bone recommended proceeding with a revision transmetatarsal amputation.  We will set this up at his convenience.  Follow-Up Instructions: Return in about 2 weeks (around 08/24/2022).   Ortho Exam  Patient is alert, oriented, no adenopathy, well-dressed, normal affect, normal respiratory effort. Examination patient does not have a palpable dorsalis pedis pulse the Doppler was used and is a strong biphasic dorsalis pedis pulse.  There is a Wagner ulcer beneath the first metatarsal after informed consent a 10 blade knife was used to debride the skin and soft tissue.  After debridement the ulcer is 1 cm diameter and probes down to bone with exposed bone consistent with osteomyelitis.  Imaging: No results found. No images are attached to the encounter.  Labs: Lab Results  Component Value Date   HGBA1C 10.2 (A) 07/07/2019   HGBA1C 9.6 (A) 10/02/2018   HGBA1C 11.1 (H) 07/29/2018   ESRSEDRATE 3 07/07/2019   ESRSEDRATE 69 (H) 10/06/2018   ESRSEDRATE 74 (H) 10/05/2018   CRP 13.7 (H) 10/06/2018   CRP 19.7 (H) 10/05/2018   CRP 19.7 (H) 10/04/2018   REPTSTATUS 02/04/2020 FINAL 01/30/2020   GRAMSTAIN  01/30/2020    RARE WBC PRESENT, PREDOMINANTLY PMN RARE GRAM  POSITIVE COCCI    CULT  01/30/2020    FEW ENTEROBACTER CLOACAE FEW ENTEROCOCCUS FAECALIS NO ANAEROBES ISOLATED Performed at [facility], [address] [place], [place] [address]    LABORGA ENTEROBACTER CLOACAE 01/30/2020   LABORGA ENTEROCOCCUS FAECALIS 01/30/2020     Lab Results  Component Value Date   ALBUMIN 3.9 10/03/2018   ALBUMIN 4.7 08/05/2018   ALBUMIN 3.9 07/29/2018   PREALBUMIN 9.6 (L) 10/04/2018    No results found for: "MG" No results found for: "VD25OH"  Lab Results  Component Value Date   PREALBUMIN 9.6 (L) 10/04/2018      Latest Ref Rng & Units 08/16/2022    6:30 AM 10/22/2020    5:50 AM 01/27/2020    7:41 AM  CBC EXTENDED  WBC 4.0 - 10.5 K/uL 6.6  7.3  5.8   RBC 4.22 - 5.81 MIL/uL 4.42  5.01  4.70   Hemoglobin 13.0 - 17.0 g/dL 28.4  13.2  44.0   HCT 39.0 - 52.0 % 38.5  43.2  41.4   Platelets 150 - 400 K/uL 222  216  343      There is no height or weight on file to calculate BMI.  Orders:  No orders of the defined types were placed in this encounter.  No orders of the defined types were placed in this encounter.  Procedures: No procedures performed  Clinical Data: No additional findings.  ROS:  All other systems negative, except as noted in the HPI. Review of Systems  Objective: Vital Signs: There were no vitals taken for this visit.  Specialty Comments:  No specialty comments available.  PMFS History: Patient Active Problem List   Diagnosis Date Noted   Dehiscence of amputation stump of left lower extremity 08/16/2022   Subacute osteomyelitis, right ankle and foot    Osteomyelitis of great toe of right foot    History of amputation of lesser toe of left foot 10/16/2018   Subacute osteomyelitis, left ankle and foot    Diabetic infection of left foot 10/04/2018   Diabetic infection of right foot 10/04/2018   Diabetic polyneuropathy associated with type 2 diabetes mellitus    Past Medical History:  Diagnosis Date    Diabetes mellitus without complication    Type II   Infection 10/04/2018   LEFT FOOT   Osteomyelitis of great toe of left foot 07/29/2018   Subacute osteomyelitis, left ankle and foot     Family History  Problem Relation Age of Onset   Diabetes Mother     Past Surgical History:  Procedure Laterality Date   AMPUTATION Left 07/31/2018   Procedure: LEFT GREAT TOE AMPUTATION;  Surgeon: [name], MD;  Location: [facility] OR;  Service: Orthopedics;  Laterality: Left;   AMPUTATION Left 10/09/2018   Procedure: LEFT FOOT 2ND TOE AND POSSIBLE 3RD TOE AMPUTATION;  Surgeon: [name], MD;  Location: [facility] OR;  Service: Orthopedics;  Laterality: Left;   AMPUTATION Right 03/17/2020   Procedure: RIGHT GREAT TOE AMPUTATION THROUGH METATARSAL;  Surgeon: [name], MD;  Location: [facility] OR;  Service: Orthopedics;  Laterality: Right;   AMPUTATION Left 10/22/2020   Procedure: LEFT TRANSMETATARSAL AMPUTATION;  Surgeon: [name], MD;  Location: [facility] OR;  Service: Orthopedics;  Laterality: Left;   APPLICATION OF WOUND VAC Right 03/17/2020   Procedure: APPLICATION OF WOUND VAC;  Surgeon: [name], MD;  Location: [facility] OR;  Service: Orthopedics;  Laterality: Right;   I & D EXTREMITY Right 01/30/2020   Procedure: DEBRIDEMENT ABSCESS RIGHT FOOT;  Surgeon: [name], MD;  Location: [facility] OR;  Service: Orthopedics;  Laterality: Right;   I & D EXTREMITY Right 04/07/2020   Procedure: PARTIAL TARSAL EXCISION RIGHT FOOT;  Surgeon: [name], MD;  Location: [facility] OR;  Service: Orthopedics;  Laterality: Right;   STUMP REVISION Left 08/16/2022   Procedure: REVISION LEFT TRANSMETATARSAL AMPUTATION;  Surgeon: [name], MD;  Location: [facility] OR;  Service: Orthopedics;  Laterality: Left;   Social History   Occupational History   Not on file  Tobacco Use   Smoking status: Former    Years: 27    Types: Cigarettes    Quit date: 2007    Years since quitting: 17.3   Smokeless tobacco: Never   Tobacco comments:     03/16/20- quit 12- 15 years ago  Vaping Use   Vaping Use: Never used  Substance and Sexual Activity   Alcohol use: Yes    Alcohol/week: 6.0 - 8.0 standard drinks of alcohol    Types: 6 - 8 Cans of beer per week   Drug use: Never   Sexual activity: Yes

## 2022-08-30 ENCOUNTER — Ambulatory Visit (INDEPENDENT_AMBULATORY_CARE_PROVIDER_SITE_OTHER): Payer: Self-pay | Admitting: Family

## 2022-08-30 ENCOUNTER — Encounter: Payer: Self-pay | Admitting: Family

## 2022-08-30 DIAGNOSIS — Z89432 Acquired absence of left foot: Secondary | ICD-10-CM

## 2022-08-30 MED ORDER — OXYCODONE-ACETAMINOPHEN 5-325 MG PO TABS: 1.0000 | ORAL_TABLET | ORAL | 0 refills | Status: DC | PRN

## 2022-08-30 MED ORDER — AMOXICILLIN-POT CLAVULANATE 500-125 MG PO TABS: 1.0000 | ORAL_TABLET | Freq: Three times a day (TID) | ORAL | 0 refills | Status: DC

## 2022-08-30 NOTE — Progress Notes (Signed)
Office Visit Note   Patient: Jon House           Date of Birth: Nov 03, 1970           MRN: [id number] Visit Date: 08/30/2022              Requested by: No referring provider defined for this encounter. PCP: Patient, No Pcp Per  Chief Complaint  Patient presents with   Left Foot - Routine Post Op      HPI: The patient is a 52 year old gentleman seen status post transmetatarsal amputation.  Surgical dressing removed today.  The patient complains of significant pain unfortunately he was unable to get his initial pain medicine filled after hospitalization discharge.  He also reports that he had fever for 2 days last week.  Denies fever today denies cough or any other new systemic symptoms.  Endorsing some nerve type shooting pain from the groin down the medial aspect of his thigh and lower leg.  Denies any redness no knots no swelling no calf pain  Assessment & Plan: Visit Diagnoses: No diagnosis found.  Plan: Concern for possible new lumbar radiculopathy on the left.  Have given him a prescription for Percocet as well as Augmentin.  He will follow-up in the office in 2 weeks.  Discussed daily dose of cleansing dry dressing  Follow-Up Instructions: No follow-ups on file.   Ortho Exam  Patient is alert, oriented, no adenopathy, well-dressed, normal affect, normal respiratory effort. On examination of the left foot he is status post transmetatarsal amputation and sutures are in place there is maceration to the plantar aspect of his foot and incision.  There is some gaping over the medial border this is 5 mm deep.  There is no active drainage no erythema or cellulitis  Imaging: No results found. No images are attached to the encounter.  Labs: Lab Results  Component Value Date   HGBA1C 10.2 (A) 07/07/2019   HGBA1C 9.6 (A) 10/02/2018   HGBA1C 11.1 (H) 07/29/2018   ESRSEDRATE 3 07/07/2019   ESRSEDRATE 69 (H) 10/06/2018   ESRSEDRATE 74 (H) 10/05/2018   CRP 13.7 (H)  10/06/2018   CRP 19.7 (H) 10/05/2018   CRP 19.7 (H) 10/04/2018   REPTSTATUS 02/04/2020 FINAL 01/30/2020   GRAMSTAIN  01/30/2020    RARE WBC PRESENT, PREDOMINANTLY PMN RARE GRAM POSITIVE COCCI    CULT  01/30/2020    FEW ENTEROBACTER CLOACAE FEW ENTEROCOCCUS FAECALIS NO ANAEROBES ISOLATED Performed at [facility], [address], [place], [place] [address]    LABORGA ENTEROBACTER CLOACAE 01/30/2020   LABORGA ENTEROCOCCUS FAECALIS 01/30/2020     Lab Results  Component Value Date   ALBUMIN 3.9 10/03/2018   ALBUMIN 4.7 08/05/2018   ALBUMIN 3.9 07/29/2018   PREALBUMIN 9.6 (L) 10/04/2018    No results found for: "MG" No results found for: "VD25OH"  Lab Results  Component Value Date   PREALBUMIN 9.6 (L) 10/04/2018      Latest Ref Rng & Units 08/16/2022    6:30 AM 10/22/2020    5:50 AM 01/27/2020    7:41 AM  CBC EXTENDED  WBC 4.0 - 10.5 K/uL 6.6  7.3  5.8   RBC 4.22 - 5.81 MIL/uL 4.42  5.01  4.70   Hemoglobin 13.0 - 17.0 g/dL 30.8  65.7  84.6   HCT 39.0 - 52.0 % 38.5  43.2  41.4   Platelets 150 - 400 K/uL 222  216  343      There  is no height or weight on file to calculate BMI.  Orders:  No orders of the defined types were placed in this encounter.  Meds ordered this encounter  Medications   oxyCODONE-acetaminophen (PERCOCET/ROXICET) 5-325 MG tablet    Sig: Take 1 tablet by mouth every 4 (four) hours as needed.    Dispense:  30 tablet    Refill:  0   amoxicillin-clavulanate (AUGMENTIN) 500-125 MG tablet    Sig: Take 1 tablet by mouth 3 (three) times daily.    Dispense:  30 tablet    Refill:  0     Procedures: No procedures performed  Clinical Data: No additional findings.  ROS:  All other systems negative, except as noted in the HPI. Review of Systems  Objective: Vital Signs: There were no vitals taken for this visit.  Specialty Comments:  No specialty comments available.  PMFS History: Patient Active Problem List   Diagnosis Date Noted    Dehiscence of amputation stump of left lower extremity (HCC) 08/16/2022   Subacute osteomyelitis, right ankle and foot (HCC)    Osteomyelitis of great toe of right foot (HCC)    History of amputation of lesser toe of left foot (HCC) 10/16/2018   Subacute osteomyelitis, left ankle and foot (HCC)    Diabetic infection of left foot (HCC) 10/04/2018   Diabetic infection of right foot (HCC) 10/04/2018   Diabetic polyneuropathy associated with type 2 diabetes mellitus (HCC)    Past Medical History:  Diagnosis Date   Diabetes mellitus without complication (HCC)    Type II   Infection 10/04/2018   LEFT FOOT   Osteomyelitis of great toe of left foot (HCC) 07/29/2018   Subacute osteomyelitis, left ankle and foot (HCC)     Family History  Problem Relation Age of Onset   Diabetes Mother     Past Surgical History:  Procedure Laterality Date   AMPUTATION Left 07/31/2018   Procedure: LEFT GREAT TOE AMPUTATION;  Surgeon: [name], MD;  Location: [facility] OR;  Service: Orthopedics;  Laterality: Left;   AMPUTATION Left 10/09/2018   Procedure: LEFT FOOT 2ND TOE AND POSSIBLE 3RD TOE AMPUTATION;  Surgeon: [name], MD;  Location: [facility] OR;  Service: Orthopedics;  Laterality: Left;   AMPUTATION Right 03/17/2020   Procedure: RIGHT GREAT TOE AMPUTATION THROUGH METATARSAL;  Surgeon: [name], MD;  Location: [facility] OR;  Service: Orthopedics;  Laterality: Right;   AMPUTATION Left 10/22/2020   Procedure: LEFT TRANSMETATARSAL AMPUTATION;  Surgeon: [name], MD;  Location: [facility] OR;  Service: Orthopedics;  Laterality: Left;   APPLICATION OF WOUND VAC Right 03/17/2020   Procedure: APPLICATION OF WOUND VAC;  Surgeon: [name], MD;  Location: [facility] OR;  Service: Orthopedics;  Laterality: Right;   I & D EXTREMITY Right 01/30/2020   Procedure: DEBRIDEMENT ABSCESS RIGHT FOOT;  Surgeon: [name], MD;  Location: [facility] OR;  Service: Orthopedics;  Laterality: Right;   I & D EXTREMITY Right 04/07/2020    Procedure: PARTIAL TARSAL EXCISION RIGHT FOOT;  Surgeon: [name], MD;  Location: [facility] OR;  Service: Orthopedics;  Laterality: Right;   STUMP REVISION Left 08/16/2022   Procedure: REVISION LEFT TRANSMETATARSAL AMPUTATION;  Surgeon: [name], MD;  Location: [facility] OR;  Service: Orthopedics;  Laterality: Left;   Social History   Occupational History   Not on file  Tobacco Use   Smoking status: Former    Years: 27    Types: Cigarettes    Quit  date: 2007    Years since quitting: 17.3   Smokeless tobacco: Never   Tobacco comments:    03/16/20- quit 12- 15 years ago  Vaping Use   Vaping Use: Never used  Substance and Sexual Activity   Alcohol use: Yes    Alcohol/week: 6.0 - 8.0 standard drinks of alcohol    Types: 6 - 8 Cans of beer per week   Drug use: Never   Sexual activity: Yes

## 2022-09-01 ENCOUNTER — Ambulatory Visit (INDEPENDENT_AMBULATORY_CARE_PROVIDER_SITE_OTHER): Payer: Self-pay | Admitting: Family

## 2022-09-01 ENCOUNTER — Encounter: Payer: Self-pay | Admitting: Family

## 2022-09-01 DIAGNOSIS — T8781 Dehiscence of amputation stump: Secondary | ICD-10-CM

## 2022-09-01 MED ORDER — AMOXICILLIN-POT CLAVULANATE 500-125 MG PO TABS: 1.0000 | ORAL_TABLET | Freq: Three times a day (TID) | ORAL | 0 refills | Status: DC

## 2022-09-01 NOTE — Progress Notes (Signed)
Post-Op Visit Note   Patient: Jon House           Date of Birth: [birth year]           MRN: [id number] Visit Date: 09/01/2022 PCP: Patient, No Pcp Per  Chief Complaint:  Chief Complaint  Patient presents with   Left Foot - Wound Check    08/16/2022 revision left transmet    HPI:  HPI The patient is a 52 year old gentleman who is seeing status post revision transmetatarsal amputation on the left he continues to have some serous drainage and is an area that has opened up.  He is concerned for infection.  Unfortunately he did not pick up the Augmentin  Describes chills Ortho Exam On examination of the left foot the transmetatarsal amputation is approximated sutures there is some gaping 4 mm over th medial column there is no exposed bone or tendon.  There is scant serous drainage no surrounding erythema no ascending cellulitis or warmth  Visit Diagnoses: No diagnosis found.  Plan: Packed with silver cell.  Given a sheet of silver cell.  Discussed wound care he will continue daily dose of cleansing dressing changes with silver cell packing encouraged him to pick up his Augmentin he will follow-up in the office in 2 weeks  Follow-Up Instructions: No follow-ups on file.   Imaging: No results found.  Orders:  No orders of the defined types were placed in this encounter.  No orders of the defined types were placed in this encounter.    PMFS History: Patient Active Problem List   Diagnosis Date Noted   Dehiscence of amputation stump of left lower extremity (HCC) 08/16/2022   Subacute osteomyelitis, right ankle and foot (HCC)    Osteomyelitis of great toe of right foot (HCC)    History of amputation of lesser toe of left foot (HCC) 10/16/2018   Subacute osteomyelitis, left ankle and foot (HCC)    Diabetic infection of left foot (HCC) 10/04/2018   Diabetic infection of right foot (HCC) 10/04/2018   Diabetic polyneuropathy associated with type 2 diabetes mellitus (HCC)     Past Medical History:  Diagnosis Date   Diabetes mellitus without complication (HCC)    Type II   Infection 10/04/2018   LEFT FOOT   Osteomyelitis of great toe of left foot (HCC) 07/29/2018   Subacute osteomyelitis, left ankle and foot (HCC)     Family History  Problem Relation Age of Onset   Diabetes Mother     Past Surgical History:  Procedure Laterality Date   AMPUTATION Left 07/31/2018   Procedure: LEFT GREAT TOE AMPUTATION;  Surgeon: [name], MD;  [facility] OR;  Service: Orthopedics;  Laterality: Left;   AMPUTATION Left 10/09/2018   Procedure: LEFT FOOT 2ND TOE AND POSSIBLE 3RD TOE AMPUTATION;  Surgeon: [name], MD;  Location: [facility] OR;  Service: Orthopedics;  Laterality: Left;   AMPUTATION Right 03/17/2020   Procedure: RIGHT GREAT TOE AMPUTATION THROUGH METATARSAL;  Surgeon: [name], MD;  Location: [facility] OR;  Service: Orthopedics;  Laterality: Right;   AMPUTATION Left 10/22/2020   Procedure: LEFT TRANSMETATARSAL AMPUTATION;  Surgeon: [name], MD;  Location: [facility] OR;  Service: Orthopedics;  Laterality: Left;   APPLICATION OF WOUND VAC Right 03/17/2020   Procedure: APPLICATION OF WOUND VAC;  Surgeon: [name], MD;  Location: [facility] OR;  Service: Orthopedics;  Laterality: Right;   I & D EXTREMITY Right 01/30/2020   Procedure: DEBRIDEMENT ABSCESS RIGHT FOOT;  Surgeon: [name], MD;  Location: [facility] OR;  Service: Orthopedics;  Laterality: Right;   I & D EXTREMITY Right 04/07/2020   Procedure: PARTIAL TARSAL EXCISION RIGHT FOOT;  Surgeon: [name], MD;  Location: [facility] OR;  Service: Orthopedics;  Laterality: Right;   STUMP REVISION Left 08/16/2022   Procedure: REVISION LEFT TRANSMETATARSAL AMPUTATION;  Surgeon: [name], MD;  Location: [facility] OR;  Service: Orthopedics;  Laterality: Left;   Social History   Occupational History   Not on file  Tobacco Use   Smoking status: Former    Years: 27    Types: Cigarettes    Quit date: 2007    Years since  quitting: 17.3   Smokeless tobacco: Never   Tobacco comments:    03/16/20- quit 12- 15 years ago  Vaping Use   Vaping Use: Never used  Substance and Sexual Activity   Alcohol use: Yes    Alcohol/week: 6.0 - 8.0 standard drinks of alcohol    Types: 6 - 8 Cans of beer per week   Drug use: Never   Sexual activity: Yes

## 2022-09-14 ENCOUNTER — Ambulatory Visit (INDEPENDENT_AMBULATORY_CARE_PROVIDER_SITE_OTHER): Payer: Self-pay | Admitting: Orthopedic Surgery

## 2022-09-14 DIAGNOSIS — Z89432 Acquired absence of left foot: Secondary | ICD-10-CM

## 2022-09-14 DIAGNOSIS — T8781 Dehiscence of amputation stump: Secondary | ICD-10-CM

## 2022-09-18 ENCOUNTER — Encounter (HOSPITAL_COMMUNITY): Payer: Self-pay | Admitting: Orthopedic Surgery

## 2022-09-25 ENCOUNTER — Encounter: Payer: Self-pay | Admitting: Orthopedic Surgery

## 2022-09-25 NOTE — Progress Notes (Signed)
Office Visit Note   Patient: Jon House           Date of Birth: 14-Mar-1971           MRN: [id number] Visit Date: 09/14/2022              Requested by: No referring provider defined for this encounter. PCP: Patient, No Pcp Per  Chief Complaint  Patient presents with   Left Foot - Routine Post Op    08/16/2022 revision left transmet      HPI: Patient is a 52 year old gentleman who is 4 weeks status post revision left transmetatarsal amputation.  Patient is on Augmentin.  Assessment & Plan: Visit Diagnoses:  1. Dehiscence of amputation stump of left lower extremity (HCC)   2. History of transmetatarsal amputation of left foot (HCC)     Plan: Will continue with protected weightbearing follow-up in 4 weeks at which time we will advance to full weightbearing.  Follow-Up Instructions: Return in about 4 weeks (around 10/12/2022).   Ortho Exam  Patient is alert, oriented, no adenopathy, well-dressed, normal affect, normal respiratory effort. Examination incision is well-healed there is no cellulitis or drainage.  The sutures are harvested.  Imaging: No results found. No images are attached to the encounter.  Labs: Lab Results  Component Value Date   HGBA1C 10.2 (A) 07/07/2019   HGBA1C 9.6 (A) 10/02/2018   HGBA1C 11.1 (H) 07/29/2018   ESRSEDRATE 3 07/07/2019   ESRSEDRATE 69 (H) 10/06/2018   ESRSEDRATE 74 (H) 10/05/2018   CRP 13.7 (H) 10/06/2018   CRP 19.7 (H) 10/05/2018   CRP 19.7 (H) 10/04/2018   REPTSTATUS 02/04/2020 FINAL 01/30/2020   GRAMSTAIN  01/30/2020    RARE WBC PRESENT, PREDOMINANTLY PMN RARE GRAM POSITIVE COCCI    CULT  01/30/2020    FEW ENTEROBACTER CLOACAE FEW ENTEROCOCCUS FAECALIS NO ANAEROBES ISOLATED Performed at [facility], [address] [place], [place] [address]    LABORGA ENTEROBACTER CLOACAE 01/30/2020   LABORGA ENTEROCOCCUS FAECALIS 01/30/2020     Lab Results  Component Value Date   ALBUMIN 3.9 10/03/2018    ALBUMIN 4.7 08/05/2018   ALBUMIN 3.9 07/29/2018   PREALBUMIN 9.6 (L) 10/04/2018    No results found for: "MG" No results found for: "VD25OH"  Lab Results  Component Value Date   PREALBUMIN 9.6 (L) 10/04/2018      Latest Ref Rng & Units 08/16/2022    6:30 AM 10/22/2020    5:50 AM 01/27/2020    7:41 AM  CBC EXTENDED  WBC 4.0 - 10.5 K/uL 6.6  7.3  5.8   RBC 4.22 - 5.81 MIL/uL 4.42  5.01  4.70   Hemoglobin 13.0 - 17.0 g/dL 19.1  47.8  29.5   HCT 39.0 - 52.0 % 38.5  43.2  41.4   Platelets 150 - 400 K/uL 222  216  343      There is no height or weight on file to calculate BMI.  Orders:  No orders of the defined types were placed in this encounter.  No orders of the defined types were placed in this encounter.    Procedures: No procedures performed  Clinical Data: No additional findings.  ROS:  All other systems negative, except as noted in the HPI. Review of Systems  Objective: Vital Signs: There were no vitals taken for this visit.  Specialty Comments:  No specialty comments available.  PMFS History: Patient Active Problem List   Diagnosis Date Noted   Dehiscence  of amputation stump of left lower extremity (HCC) 08/16/2022   Subacute osteomyelitis, right ankle and foot (HCC)    Osteomyelitis of great toe of right foot (HCC)    History of amputation of lesser toe of left foot (HCC) 10/16/2018   Subacute osteomyelitis, left ankle and foot (HCC)    Diabetic infection of left foot (HCC) 10/04/2018   Diabetic infection of right foot (HCC) 10/04/2018   Diabetic polyneuropathy associated with type 2 diabetes mellitus (HCC)    Past Medical History:  Diagnosis Date   Diabetes mellitus without complication (HCC)    Type II   Infection 10/04/2018   LEFT FOOT   Osteomyelitis of great toe of left foot (HCC) 07/29/2018   Subacute osteomyelitis, left ankle and foot (HCC)     Family History  Problem Relation Age of Onset   Diabetes Mother     Past Surgical History:   Procedure Laterality Date   AMPUTATION Left 07/31/2018   Procedure: LEFT GREAT TOE AMPUTATION;  Surgeon: [name], MD;  Location: [facility] OR;  Service: Orthopedics;  Laterality: Left;   AMPUTATION Left 10/09/2018   Procedure: LEFT FOOT 2ND TOE AND POSSIBLE 3RD TOE AMPUTATION;  Surgeon: [name], MD;  Location: [facility] OR;  Service: Orthopedics;  Laterality: Left;   AMPUTATION Right 03/17/2020   Procedure: RIGHT GREAT TOE AMPUTATION THROUGH METATARSAL;  Surgeon: [name], MD;  Location: [facility] OR;  Service: Orthopedics;  Laterality: Right;   AMPUTATION Left 10/22/2020   Procedure: LEFT TRANSMETATARSAL AMPUTATION;  Surgeon: [name], MD;  Location: [facility] OR;  Service: Orthopedics;  Laterality: Left;   APPLICATION OF WOUND VAC Right 03/17/2020   Procedure: APPLICATION OF WOUND VAC;  Surgeon: [name], MD;  Location: [facility] OR;  Service: Orthopedics;  Laterality: Right;   I & D EXTREMITY Right 01/30/2020   Procedure: DEBRIDEMENT ABSCESS RIGHT FOOT;  Surgeon: [name], MD;  Location: [facility] OR;  Service: Orthopedics;  Laterality: Right;   I & D EXTREMITY Right 04/07/2020   Procedure: PARTIAL TARSAL EXCISION RIGHT FOOT;  Surgeon: [name], MD;  Location: [facility] OR;  Service: Orthopedics;  Laterality: Right;   STUMP REVISION Left 08/16/2022   Procedure: REVISION LEFT TRANSMETATARSAL AMPUTATION;  Surgeon: [name], MD;  Location: [facility] OR;  Service: Orthopedics;  Laterality: Left;   Social History   Occupational History   Not on file  Tobacco Use   Smoking status: Former    Years: 27    Types: Cigarettes    Quit date: 2007    Years since quitting: 17.4   Smokeless tobacco: Never   Tobacco comments:    03/16/20- quit 12- 15 years ago  Vaping Use   Vaping Use: Never used  Substance and Sexual Activity   Alcohol use: Yes    Alcohol/week: 6.0 - 8.0 standard drinks of alcohol    Types: 6 - 8 Cans of beer per week   Drug use: Never   Sexual activity: Yes

## 2022-10-16 ENCOUNTER — Encounter: Payer: Self-pay | Admitting: Orthopedic Surgery

## 2022-10-16 ENCOUNTER — Ambulatory Visit (INDEPENDENT_AMBULATORY_CARE_PROVIDER_SITE_OTHER): Payer: Self-pay | Admitting: Orthopedic Surgery

## 2022-10-16 DIAGNOSIS — T8781 Dehiscence of amputation stump: Secondary | ICD-10-CM

## 2022-10-16 DIAGNOSIS — Z89432 Acquired absence of left foot: Secondary | ICD-10-CM

## 2022-10-16 NOTE — Progress Notes (Signed)
Office Visit Note   Patient: Jon House           Date of Birth: 02/05/1971           MRN: [id number] Visit Date: 10/16/2022              Requested by: No referring provider defined for this encounter. PCP: Patient, No Pcp Per  Chief Complaint  Patient presents with   Left Foot - Routine Post Op    08/16/2022 revision left transmet      HPI: Patient is a 52 year old gentleman status post revision left transmetatarsal amputation.  Patient is 2 months out.  Assessment & Plan: Visit Diagnoses:  1. History of transmetatarsal amputation of left foot (HCC)   2. Dehiscence of amputation stump of left lower extremity (HCC)     Plan: Continue protected weightbearing with a postoperative shoe and a crutch.  Follow-Up Instructions: Return in about 4 weeks (around 11/13/2022).   Ortho Exam  Patient is alert, oriented, no adenopathy, well-dressed, normal affect, normal respiratory effort. Examination there is no drainage no cellulitis no signs of infection he has good dorsiflexion of the ankle.  He has a small wound that measures 5 mm in diameter without signs of infection.  Imaging: No results found. No images are attached to the encounter.  Labs: Lab Results  Component Value Date   HGBA1C 10.2 (A) 07/07/2019   HGBA1C 9.6 (A) 10/02/2018   HGBA1C 11.1 (H) 07/29/2018   ESRSEDRATE 3 07/07/2019   ESRSEDRATE 69 (H) 10/06/2018   ESRSEDRATE 74 (H) 10/05/2018   CRP 13.7 (H) 10/06/2018   CRP 19.7 (H) 10/05/2018   CRP 19.7 (H) 10/04/2018   REPTSTATUS 02/04/2020 FINAL 01/30/2020   GRAMSTAIN  01/30/2020    RARE WBC PRESENT, PREDOMINANTLY PMN RARE GRAM POSITIVE COCCI    CULT  01/30/2020    FEW ENTEROBACTER CLOACAE FEW ENTEROCOCCUS FAECALIS NO ANAEROBES ISOLATED Performed at [facility], [address], [place], [place] [address]    LABORGA ENTEROBACTER CLOACAE 01/30/2020   LABORGA ENTEROCOCCUS FAECALIS 01/30/2020     Lab Results  Component Value Date    ALBUMIN 3.9 10/03/2018   ALBUMIN 4.7 08/05/2018   ALBUMIN 3.9 07/29/2018   PREALBUMIN 9.6 (L) 10/04/2018    No results found for: "MG" No results found for: "VD25OH"  Lab Results  Component Value Date   PREALBUMIN 9.6 (L) 10/04/2018      Latest Ref Rng & Units 08/16/2022    6:30 AM 10/22/2020    5:50 AM 01/27/2020    7:41 AM  CBC EXTENDED  WBC 4.0 - 10.5 K/uL 6.6  7.3  5.8   RBC 4.22 - 5.81 MIL/uL 4.42  5.01  4.70   Hemoglobin 13.0 - 17.0 g/dL 62.1  30.8  65.7   HCT 39.0 - 52.0 % 38.5  43.2  41.4   Platelets 150 - 400 K/uL 222  216  343      There is no height or weight on file to calculate BMI.  Orders:  No orders of the defined types were placed in this encounter.  No orders of the defined types were placed in this encounter.    Procedures: No procedures performed  Clinical Data: No additional findings.  ROS:  All other systems negative, except as noted in the HPI. Review of Systems  Objective: Vital Signs: There were no vitals taken for this visit.  Specialty Comments:  No specialty comments available.  PMFS History: Patient Active Problem List  Diagnosis Date Noted   Dehiscence of amputation stump of left lower extremity (HCC) 08/16/2022   Subacute osteomyelitis, right ankle and foot (HCC)    Osteomyelitis of great toe of right foot (HCC)    History of amputation of lesser toe of left foot (HCC) 10/16/2018   Subacute osteomyelitis, left ankle and foot (HCC)    Diabetic infection of left foot (HCC) 10/04/2018   Diabetic infection of right foot (HCC) 10/04/2018   Diabetic polyneuropathy associated with type 2 diabetes mellitus (HCC)    Past Medical History:  Diagnosis Date   Diabetes mellitus without complication (HCC)    Type II   Infection 10/04/2018   LEFT FOOT   Osteomyelitis of great toe of left foot (HCC) 07/29/2018   Subacute osteomyelitis, left ankle and foot (HCC)     Family History  Problem Relation Age of Onset   Diabetes Mother      Past Surgical History:  Procedure Laterality Date   AMPUTATION Left 07/31/2018   Procedure: LEFT GREAT TOE AMPUTATION;  Surgeon: [name], MD;  Location: [facility] OR;  Service: Orthopedics;  Laterality: Left;   AMPUTATION Left 10/09/2018   Procedure: LEFT FOOT 2ND TOE AND POSSIBLE 3RD TOE AMPUTATION;  Surgeon: [name], MD;  Location: [facility] OR;  Service: Orthopedics;  Laterality: Left;   AMPUTATION Right 03/17/2020   Procedure: RIGHT GREAT TOE AMPUTATION THROUGH METATARSAL;  Surgeon: [name], MD;  Location: [facility] OR;  Service: Orthopedics;  Laterality: Right;   AMPUTATION Left 10/22/2020   Procedure: LEFT TRANSMETATARSAL AMPUTATION;  Surgeon: [name], MD;  Location: [facility] OR;  Service: Orthopedics;  Laterality: Left;   APPLICATION OF WOUND VAC Right 03/17/2020   Procedure: APPLICATION OF WOUND VAC;  Surgeon: [name], MD;  Location: [facility] OR;  Service: Orthopedics;  Laterality: Right;   I & D EXTREMITY Right 01/30/2020   Procedure: DEBRIDEMENT ABSCESS RIGHT FOOT;  Surgeon: [name], MD;  Location: [facility] OR;  Service: Orthopedics;  Laterality: Right;   I & D EXTREMITY Right 04/07/2020   Procedure: PARTIAL TARSAL EXCISION RIGHT FOOT;  Surgeon: [name], MD;  Location: [facility] OR;  Service: Orthopedics;  Laterality: Right;   STUMP REVISION Left 08/16/2022   Procedure: REVISION LEFT TRANSMETATARSAL AMPUTATION;  Surgeon: [name], MD;  Location: [facility] OR;  Service: Orthopedics;  Laterality: Left;   Social History   Occupational History   Not on file  Tobacco Use   Smoking status: Former    Years: 27    Types: Cigarettes    Quit date: 2007    Years since quitting: 17.4   Smokeless tobacco: Never   Tobacco comments:    03/16/20- quit 12- 15 years ago  Vaping Use   Vaping Use: Never used  Substance and Sexual Activity   Alcohol use: Yes    Alcohol/week: 6.0 - 8.0 standard drinks of alcohol    Types: 6 - 8 Cans of beer per week   Drug use: Never   Sexual activity: Yes

## 2022-11-13 ENCOUNTER — Ambulatory Visit (INDEPENDENT_AMBULATORY_CARE_PROVIDER_SITE_OTHER): Payer: Self-pay | Admitting: Orthopedic Surgery

## 2022-11-13 DIAGNOSIS — T8781 Dehiscence of amputation stump: Secondary | ICD-10-CM

## 2022-11-13 DIAGNOSIS — Z89432 Acquired absence of left foot: Secondary | ICD-10-CM

## 2022-11-14 ENCOUNTER — Encounter: Payer: Self-pay | Admitting: Orthopedic Surgery

## 2022-11-14 NOTE — Progress Notes (Signed)
Office Visit Note   Patient: Jon House           Date of Birth: August 28, 1970           MRN: [id number] Visit Date: 11/13/2022              Requested by: No referring provider defined for this encounter. PCP: Patient, No Pcp Per  Chief Complaint  Patient presents with   Left Foot - Routine Post Op    08/16/2022 revision TMA       HPI: Patient is a 52 year old gentleman who is 3 months status post left transmetatarsal amputation revision.  Assessment & Plan: Visit Diagnoses:  1. History of transmetatarsal amputation of left foot (HCC)   2. Dehiscence of amputation stump of left lower extremity (HCC)     Plan: The ulcers were debrided we will continue with routine wound care postoperative shoe minimize weightbearing.  Follow-Up Instructions: Return in about 4 weeks (around 12/11/2022).   Ortho Exam  Patient is alert, oriented, no adenopathy, well-dressed, normal affect, normal respiratory effort. Examination patient has 3 Wagner grade 1 ulcers over the residual limb left transmetatarsal amputation.  There is no wound dehiscence no cellulitis no drainage.  There is no exposed bone or tendon.  After informed consent the 3 Wagner grade 1 ulcers were debrided with a 10 blade knife.  The medial ulcer is 2 cm in diameter the central ulcer is 1 cm diameter and the lateral ulcer is 2 cm in diameter after debridement.  There was good petechial bleeding the wounds were approximately 2 mm deep with no tunneling no drainage no exposed bone or tendon.  Imaging: No results found. No images are attached to the encounter.  Labs: Lab Results  Component Value Date   HGBA1C 10.2 (A) 07/07/2019   HGBA1C 9.6 (A) 10/02/2018   HGBA1C 11.1 (H) 07/29/2018   ESRSEDRATE 3 07/07/2019   ESRSEDRATE 69 (H) 10/06/2018   ESRSEDRATE 74 (H) 10/05/2018   CRP 13.7 (H) 10/06/2018   CRP 19.7 (H) 10/05/2018   CRP 19.7 (H) 10/04/2018   REPTSTATUS 02/04/2020 FINAL 01/30/2020   GRAMSTAIN  01/30/2020     RARE WBC PRESENT, PREDOMINANTLY PMN RARE GRAM POSITIVE COCCI    CULT  01/30/2020    FEW ENTEROBACTER CLOACAE FEW ENTEROCOCCUS FAECALIS NO ANAEROBES ISOLATED Performed at [facility], [address] [place], [place] [address]    LABORGA ENTEROBACTER CLOACAE 01/30/2020   LABORGA ENTEROCOCCUS FAECALIS 01/30/2020     Lab Results  Component Value Date   ALBUMIN 3.9 10/03/2018   ALBUMIN 4.7 08/05/2018   ALBUMIN 3.9 07/29/2018   PREALBUMIN 9.6 (L) 10/04/2018    No results found for: "MG" No results found for: "VD25OH"  Lab Results  Component Value Date   PREALBUMIN 9.6 (L) 10/04/2018      Latest Ref Rng & Units 08/16/2022    6:30 AM 10/22/2020    5:50 AM 01/27/2020    7:41 AM  CBC EXTENDED  WBC 4.0 - 10.5 K/uL 6.6  7.3  5.8   RBC 4.22 - 5.81 MIL/uL 4.42  5.01  4.70   Hemoglobin 13.0 - 17.0 g/dL 62.1  30.8  65.7   HCT 39.0 - 52.0 % 38.5  43.2  41.4   Platelets 150 - 400 K/uL 222  216  343      There is no height or weight on file to calculate BMI.  Orders:  No orders of the defined types were placed in this  encounter.  No orders of the defined types were placed in this encounter.    Procedures: No procedures performed  Clinical Data: No additional findings.  ROS:  All other systems negative, except as noted in the HPI. Review of Systems  Objective: Vital Signs: There were no vitals taken for this visit.  Specialty Comments:  No specialty comments available.  PMFS History: Patient Active Problem List   Diagnosis Date Noted   Dehiscence of amputation stump of left lower extremity (HCC) 08/16/2022   Subacute osteomyelitis, right ankle and foot (HCC)    Osteomyelitis of great toe of right foot (HCC)    History of amputation of lesser toe of left foot (HCC) 10/16/2018   Subacute osteomyelitis, left ankle and foot (HCC)    Diabetic infection of left foot (HCC) 10/04/2018   Diabetic infection of right foot (HCC) 10/04/2018   Diabetic  polyneuropathy associated with type 2 diabetes mellitus (HCC)    Past Medical History:  Diagnosis Date   Diabetes mellitus without complication (HCC)    Type II   Infection 10/04/2018   LEFT FOOT   Osteomyelitis of great toe of left foot (HCC) 07/29/2018   Subacute osteomyelitis, left ankle and foot (HCC)     Family History  Problem Relation Age of Onset   Diabetes Mother     Past Surgical History:  Procedure Laterality Date   AMPUTATION Left 07/31/2018   Procedure: LEFT GREAT TOE AMPUTATION;  Surgeon: [name], MD;  Location: [facility] OR;  Service: Orthopedics;  Laterality: Left;   AMPUTATION Left 10/09/2018   Procedure: LEFT FOOT 2ND TOE AND POSSIBLE 3RD TOE AMPUTATION;  Surgeon: [name], MD;  Location: [facility] OR;  Service: Orthopedics;  Laterality: Left;   AMPUTATION Right 03/17/2020   Procedure: RIGHT GREAT TOE AMPUTATION THROUGH METATARSAL;  Surgeon: [name], MD;  Location: [facility] OR;  Service: Orthopedics;  Laterality: Right;   AMPUTATION Left 10/22/2020   Procedure: LEFT TRANSMETATARSAL AMPUTATION;  Surgeon: [name], MD;  Location: [facility] OR;  Service: Orthopedics;  Laterality: Left;   APPLICATION OF WOUND VAC Right 03/17/2020   Procedure: APPLICATION OF WOUND VAC;  Surgeon: [name], MD;  Location: [facility] OR;  Service: Orthopedics;  Laterality: Right;   I & D EXTREMITY Right 01/30/2020   Procedure: DEBRIDEMENT ABSCESS RIGHT FOOT;  Surgeon: [name], MD;  Location: [facility] OR;  Service: Orthopedics;  Laterality: Right;   I & D EXTREMITY Right 04/07/2020   Procedure: PARTIAL TARSAL EXCISION RIGHT FOOT;  Surgeon: [name], MD;  Location: [facility] OR;  Service: Orthopedics;  Laterality: Right;   STUMP REVISION Left 08/16/2022   Procedure: REVISION LEFT TRANSMETATARSAL AMPUTATION;  Surgeon: [name], MD;  Location: [facility] OR;  Service: Orthopedics;  Laterality: Left;   Social History   Occupational History   Not on file  Tobacco Use   Smoking status: Former     Current packs/day: 0.00    Types: Cigarettes    Start date: 61    Quit date: 2007    Years since quitting: 17.5   Smokeless tobacco: Never   Tobacco comments:    03/16/20- quit 12- 15 years ago  Vaping Use   Vaping status: Never Used  Substance and Sexual Activity   Alcohol use: Yes    Alcohol/week: 6.0 - 8.0 standard drinks of alcohol    Types: 6 - 8 Cans of beer per week   Drug use: Never   Sexual activity: Yes

## 2022-12-11 ENCOUNTER — Encounter: Payer: Self-pay | Admitting: Orthopedic Surgery

## 2022-12-11 ENCOUNTER — Ambulatory Visit (INDEPENDENT_AMBULATORY_CARE_PROVIDER_SITE_OTHER): Payer: Self-pay | Admitting: Orthopedic Surgery

## 2022-12-11 DIAGNOSIS — T8781 Dehiscence of amputation stump: Secondary | ICD-10-CM

## 2022-12-11 DIAGNOSIS — L97521 Non-pressure chronic ulcer of other part of left foot limited to breakdown of skin: Secondary | ICD-10-CM

## 2022-12-11 DIAGNOSIS — Z89432 Acquired absence of left foot: Secondary | ICD-10-CM

## 2022-12-11 NOTE — Progress Notes (Signed)
Office Visit Note   Patient: Jon House           Date of Birth: 03/05/1971           MRN: [id number] Visit Date: 12/11/2022              Requested by: No referring provider defined for this encounter. PCP: Patient, No Pcp Per  Chief Complaint  Patient presents with   Left Foot - Follow-up    08/16/2022 revision TMA      HPI: Patient is a 52 year old gentleman who is 4 months status post revision left transmetatarsal amputation.  Patient has 2 Wagner grade 1 ulcers.  He is currently using a kneeling scooter to minimize weightbearing.  Assessment & Plan: Visit Diagnoses:  1. History of transmetatarsal amputation of left foot (HCC)   2. Dehiscence of amputation stump of left lower extremity (HCC)   3. Non-pressure chronic ulcer of other part of left foot limited to breakdown of skin (HCC)     Plan: Continue routine wound care continue the kneeling scooter.  Follow-Up Instructions: Return in about 4 weeks (around 01/08/2023).   Ortho Exam  Patient is alert, oriented, no adenopathy, well-dressed, normal affect, normal respiratory effort. Examination patient has a Wagner grade 1 ulcer beneath the first metatarsal and a Wagner grade 1 ulcer on the lateral border of the fifth metatarsal.  There is no ascending cellulitis there is decreased swelling.  After informed consent a 10 blade knife was used to debride the skin and soft tissue back to healthy viable granulation tissue.  The wounds were touch with silver nitrate.  The first metatarsal ulcer is 1 cm in diameter 1 mm deep in the lateral fifth metatarsal ulcer is 1 cm in diameter and 1 mm deep.  Both have healthy granulation tissue.  Imaging: No results found. No images are attached to the encounter.  Labs: Lab Results  Component Value Date   HGBA1C 10.2 (A) 07/07/2019   HGBA1C 9.6 (A) 10/02/2018   HGBA1C 11.1 (H) 07/29/2018   ESRSEDRATE 3 07/07/2019   ESRSEDRATE 69 (H) 10/06/2018   ESRSEDRATE 74 (H) 10/05/2018    CRP 13.7 (H) 10/06/2018   CRP 19.7 (H) 10/05/2018   CRP 19.7 (H) 10/04/2018   REPTSTATUS 02/04/2020 FINAL 01/30/2020   GRAMSTAIN  01/30/2020    RARE WBC PRESENT, PREDOMINANTLY PMN RARE GRAM POSITIVE COCCI    CULT  01/30/2020    FEW ENTEROBACTER CLOACAE FEW ENTEROCOCCUS FAECALIS NO ANAEROBES ISOLATED Performed at [facility], [address], [place], [place] [address]    LABORGA ENTEROBACTER CLOACAE 01/30/2020   LABORGA ENTEROCOCCUS FAECALIS 01/30/2020     Lab Results  Component Value Date   ALBUMIN 3.9 10/03/2018   ALBUMIN 4.7 08/05/2018   ALBUMIN 3.9 07/29/2018   PREALBUMIN 9.6 (L) 10/04/2018    No results found for: "MG" No results found for: "VD25OH"  Lab Results  Component Value Date   PREALBUMIN 9.6 (L) 10/04/2018      Latest Ref Rng & Units 08/16/2022    6:30 AM 10/22/2020    5:50 AM 01/27/2020    7:41 AM  CBC EXTENDED  WBC 4.0 - 10.5 K/uL 6.6  7.3  5.8   RBC 4.22 - 5.81 MIL/uL 4.42  5.01  4.70   Hemoglobin 13.0 - 17.0 g/dL 19.1  47.8  29.5   HCT 39.0 - 52.0 % 38.5  43.2  41.4   Platelets 150 - 400 K/uL 222  216  343  There is no height or weight on file to calculate BMI.  Orders:  No orders of the defined types were placed in this encounter.  No orders of the defined types were placed in this encounter.    Procedures: No procedures performed  Clinical Data: No additional findings.  ROS:  All other systems negative, except as noted in the HPI. Review of Systems  Objective: Vital Signs: There were no vitals taken for this visit.  Specialty Comments:  No specialty comments available.  PMFS History: Patient Active Problem List   Diagnosis Date Noted   Dehiscence of amputation stump of left lower extremity (HCC) 08/16/2022   Subacute osteomyelitis, right ankle and foot (HCC)    Osteomyelitis of great toe of right foot (HCC)    History of amputation of lesser toe of left foot (HCC) 10/16/2018   Subacute osteomyelitis, left  ankle and foot (HCC)    Diabetic infection of left foot (HCC) 10/04/2018   Diabetic infection of right foot (HCC) 10/04/2018   Diabetic polyneuropathy associated with type 2 diabetes mellitus (HCC)    Past Medical History:  Diagnosis Date   Diabetes mellitus without complication (HCC)    Type II   Infection 10/04/2018   LEFT FOOT   Osteomyelitis of great toe of left foot (HCC) 07/29/2018   Subacute osteomyelitis, left ankle and foot (HCC)     Family History  Problem Relation Age of Onset   Diabetes Mother     Past Surgical History:  Procedure Laterality Date   AMPUTATION Left 07/31/2018   Procedure: LEFT GREAT TOE AMPUTATION;  Surgeon: [name], MD;  Location: [facility] OR;  Service: Orthopedics;  Laterality: Left;   AMPUTATION Left 10/09/2018   Procedure: LEFT FOOT 2ND TOE AND POSSIBLE 3RD TOE AMPUTATION;  Surgeon: [name], MD;  Location: [facility] OR;  Service: Orthopedics;  Laterality: Left;   AMPUTATION Right 03/17/2020   Procedure: RIGHT GREAT TOE AMPUTATION THROUGH METATARSAL;  Surgeon: [name], MD;  Location: [facility] OR;  Service: Orthopedics;  Laterality: Right;   AMPUTATION Left 10/22/2020   Procedure: LEFT TRANSMETATARSAL AMPUTATION;  Surgeon: [name], MD;  Location: [facility] OR;  Service: Orthopedics;  Laterality: Left;   APPLICATION OF WOUND VAC Right 03/17/2020   Procedure: APPLICATION OF WOUND VAC;  Surgeon: [name], MD;  Location: [facility] OR;  Service: Orthopedics;  Laterality: Right;   I & D EXTREMITY Right 01/30/2020   Procedure: DEBRIDEMENT ABSCESS RIGHT FOOT;  Surgeon: [name], MD;  Location: [facility] OR;  Service: Orthopedics;  Laterality: Right;   I & D EXTREMITY Right 04/07/2020   Procedure: PARTIAL TARSAL EXCISION RIGHT FOOT;  Surgeon: [name], MD;  Location: [facility] OR;  Service: Orthopedics;  Laterality: Right;   STUMP REVISION Left 08/16/2022   Procedure: REVISION LEFT TRANSMETATARSAL AMPUTATION;  Surgeon: [name], MD;  Location: [facility] OR;  Service:  Orthopedics;  Laterality: Left;   Social History   Occupational History   Not on file  Tobacco Use   Smoking status: Former    Current packs/day: 0.00    Types: Cigarettes    Start date: 57    Quit date: 2007    Years since quitting: 17.6   Smokeless tobacco: Never   Tobacco comments:    03/16/20- quit 12- 15 years ago  Vaping Use   Vaping status: Never Used  Substance and Sexual Activity   Alcohol use: Yes    Alcohol/week: 6.0 - 8.0 standard drinks of  alcohol    Types: 6 - 8 Cans of beer per week   Drug use: Never   Sexual activity: Yes

## 2023-01-08 ENCOUNTER — Encounter: Payer: Self-pay | Admitting: Orthopedic Surgery

## 2023-01-23 ENCOUNTER — Ambulatory Visit (INDEPENDENT_AMBULATORY_CARE_PROVIDER_SITE_OTHER): Payer: Self-pay | Admitting: Orthopedic Surgery

## 2023-01-23 DIAGNOSIS — Z89432 Acquired absence of left foot: Secondary | ICD-10-CM

## 2023-01-23 DIAGNOSIS — T8781 Dehiscence of amputation stump: Secondary | ICD-10-CM

## 2023-01-28 ENCOUNTER — Encounter: Payer: Self-pay | Admitting: Orthopedic Surgery

## 2023-01-28 NOTE — Progress Notes (Signed)
Office Visit Note   Patient: Jon House           Date of Birth: 1971/03/27           MRN: [id number] Visit Date: 01/23/2023              Requested by: No referring provider defined for this encounter. PCP: Patient, No Pcp Per  Chief Complaint  Patient presents with   Left Foot - Follow-up    08/16/2022 revision TMA      HPI: Patient is a 52 year old gentleman who is 5 months status post revision left transmetatarsal amputation.  Patient presents with ulceration and maceration.  Patient states ulceration started after resuming work.  Patient is currently full weightbearing with crocs.  Assessment & Plan: Visit Diagnoses:  1. History of transmetatarsal amputation of left foot (HCC)   2. Dehiscence of amputation stump of left lower extremity (HCC)     Plan: Recommended stiff soled work shoes.  Follow-Up Instructions: Return in about 4 weeks (around 02/20/2023).   Ortho Exam  Patient is alert, oriented, no adenopathy, well-dressed, normal affect, normal respiratory effort. Examination patient is a stable transmetatarsal amputation.  He has a good palpable pulse.  There are 2 Wagner grade 1 ulcers on the plantar aspect of the left foot.  After informed consent a 10 blade knife was used to debride the skin and soft tissue back to healthy viable tissue.  There is no exposed bone or tendon there is no tunneling.  1 ulcer is 2 cm in diameter and 1 mm deep the other ulcer is 1 cm in diameter 1 mm deep after debridement.  No current A1c on file.  Imaging: No results found. No images are attached to the encounter.  Labs: Lab Results  Component Value Date   HGBA1C 10.2 (A) 07/07/2019   HGBA1C 9.6 (A) 10/02/2018   HGBA1C 11.1 (H) 07/29/2018   ESRSEDRATE 3 07/07/2019   ESRSEDRATE 69 (H) 10/06/2018   ESRSEDRATE 74 (H) 10/05/2018   CRP 13.7 (H) 10/06/2018   CRP 19.7 (H) 10/05/2018   CRP 19.7 (H) 10/04/2018   REPTSTATUS 02/04/2020 FINAL 01/30/2020   GRAMSTAIN   01/30/2020    RARE WBC PRESENT, PREDOMINANTLY PMN RARE GRAM POSITIVE COCCI    CULT  01/30/2020    FEW ENTEROBACTER CLOACAE FEW ENTEROCOCCUS FAECALIS NO ANAEROBES ISOLATED Performed at [facility], [address] [place], [place] [address]    LABORGA ENTEROBACTER CLOACAE 01/30/2020   LABORGA ENTEROCOCCUS FAECALIS 01/30/2020     Lab Results  Component Value Date   ALBUMIN 3.9 10/03/2018   ALBUMIN 4.7 08/05/2018   ALBUMIN 3.9 07/29/2018   PREALBUMIN 9.6 (L) 10/04/2018    No results found for: "MG" No results found for: "VD25OH"  Lab Results  Component Value Date   PREALBUMIN 9.6 (L) 10/04/2018      Latest Ref Rng & Units 08/16/2022    6:30 AM 10/22/2020    5:50 AM 01/27/2020    7:41 AM  CBC EXTENDED  WBC 4.0 - 10.5 K/uL 6.6  7.3  5.8   RBC 4.22 - 5.81 MIL/uL 4.42  5.01  4.70   Hemoglobin 13.0 - 17.0 g/dL 59.5  63.8  75.6   HCT 39.0 - 52.0 % 38.5  43.2  41.4   Platelets 150 - 400 K/uL 222  216  343      There is no height or weight on file to calculate BMI.  Orders:  No orders of the defined  types were placed in this encounter.  No orders of the defined types were placed in this encounter.    Procedures: No procedures performed  Clinical Data: No additional findings.  ROS:  All other systems negative, except as noted in the HPI. Review of Systems  Objective: Vital Signs: There were no vitals taken for this visit.  Specialty Comments:  No specialty comments available.  PMFS History: Patient Active Problem List   Diagnosis Date Noted   Dehiscence of amputation stump of left lower extremity (HCC) 08/16/2022   Subacute osteomyelitis, right ankle and foot (HCC)    Osteomyelitis of great toe of right foot (HCC)    History of amputation of lesser toe of left foot (HCC) 10/16/2018   Subacute osteomyelitis, left ankle and foot (HCC)    Diabetic infection of left foot (HCC) 10/04/2018   Diabetic infection of right foot (HCC) 10/04/2018   Diabetic  polyneuropathy associated with type 2 diabetes mellitus (HCC)    Past Medical History:  Diagnosis Date   Diabetes mellitus without complication (HCC)    Type II   Infection 10/04/2018   LEFT FOOT   Osteomyelitis of great toe of left foot (HCC) 07/29/2018   Subacute osteomyelitis, left ankle and foot (HCC)     Family History  Problem Relation Age of Onset   Diabetes Mother     Past Surgical History:  Procedure Laterality Date   AMPUTATION Left 07/31/2018   Procedure: LEFT GREAT TOE AMPUTATION;  Surgeon: [name], MD;  Location: [facility] OR;  Service: Orthopedics;  Laterality: Left;   AMPUTATION Left 10/09/2018   Procedure: LEFT FOOT 2ND TOE AND POSSIBLE 3RD TOE AMPUTATION;  Surgeon: [name], MD;  Location: [facility] OR;  Service: Orthopedics;  Laterality: Left;   AMPUTATION Right 03/17/2020   Procedure: RIGHT GREAT TOE AMPUTATION THROUGH METATARSAL;  Surgeon: [name], MD;  Location: [facility] OR;  Service: Orthopedics;  Laterality: Right;   AMPUTATION Left 10/22/2020   Procedure: LEFT TRANSMETATARSAL AMPUTATION;  Surgeon: [name], MD;  Location: [facility] OR;  Service: Orthopedics;  Laterality: Left;   APPLICATION OF WOUND VAC Right 03/17/2020   Procedure: APPLICATION OF WOUND VAC;  Surgeon: [name], MD;  Location: [facility] OR;  Service: Orthopedics;  Laterality: Right;   I & D EXTREMITY Right 01/30/2020   Procedure: DEBRIDEMENT ABSCESS RIGHT FOOT;  Surgeon: [name], MD;  Location: [facility] OR;  Service: Orthopedics;  Laterality: Right;   I & D EXTREMITY Right 04/07/2020   Procedure: PARTIAL TARSAL EXCISION RIGHT FOOT;  Surgeon: [name], MD;  Location: [facility] OR;  Service: Orthopedics;  Laterality: Right;   STUMP REVISION Left 08/16/2022   Procedure: REVISION LEFT TRANSMETATARSAL AMPUTATION;  Surgeon: [name], MD;  Location: [facility] OR;  Service: Orthopedics;  Laterality: Left;   Social History   Occupational History   Not on file  Tobacco Use   Smoking status: Former     Current packs/day: 0.00    Types: Cigarettes    Start date: 18    Quit date: 2007    Years since quitting: 17.7   Smokeless tobacco: Never   Tobacco comments:    03/16/20- quit 12- 15 years ago  Vaping Use   Vaping status: Never Used  Substance and Sexual Activity   Alcohol use: Yes    Alcohol/week: 6.0 - 8.0 standard drinks of alcohol    Types: 6 - 8 Cans of beer per week   Drug use: Never  Sexual activity: Yes

## 2023-02-20 ENCOUNTER — Ambulatory Visit (INDEPENDENT_AMBULATORY_CARE_PROVIDER_SITE_OTHER): Payer: Self-pay | Admitting: Orthopedic Surgery

## 2023-02-20 ENCOUNTER — Encounter: Payer: Self-pay | Admitting: Orthopedic Surgery

## 2023-02-20 ENCOUNTER — Other Ambulatory Visit (INDEPENDENT_AMBULATORY_CARE_PROVIDER_SITE_OTHER): Payer: Self-pay

## 2023-02-20 DIAGNOSIS — Z89432 Acquired absence of left foot: Secondary | ICD-10-CM

## 2023-02-20 DIAGNOSIS — M86172 Other acute osteomyelitis, left ankle and foot: Secondary | ICD-10-CM

## 2023-02-20 MED ORDER — DOXYCYCLINE HYCLATE 100 MG PO TABS: 100.0000 mg | ORAL_TABLET | Freq: Two times a day (BID) | ORAL | 0 refills | Status: DC

## 2023-02-20 NOTE — Progress Notes (Signed)
Office Visit Note   Patient: Jon House           Date of Birth: 01-21-1971           MRN: [id number] Visit Date: 02/20/2023              Requested by: No referring provider defined for this encounter. PCP: Patient, No Pcp Per  Chief Complaint  Patient presents with   Left Foot - Follow-up    08/16/2022 revision TMA      HPI: Patient is a 52 year old gentleman who presents 6 months status post revision left transmetatarsal amputation.  He has had 2 chronic Wagner grade 1 ulcers on the plantar aspect of his foot and has developed a new necrotic ulcer laterally over the fifth metatarsal.  Assessment & Plan: Visit Diagnoses:  1. History of transmetatarsal amputation of left foot (HCC)   2. Osteomyelitis of foot, left, acute (HCC)     Plan: Prescription is sent in for doxycycline will obtain an MRI scan to further evaluate for osteomyelitis in the first fourth and fifth metatarsals.  Follow-up after the MRI is obtained.  Follow-Up Instructions: No follow-ups on file.   Ortho Exam  Patient is alert, oriented, no adenopathy, well-dressed, normal affect, normal respiratory effort. Examination patient has a palpable pulse there is no cellulitis.  He has a new necrotic ulcer over the lateral aspect of the fifth metatarsal this probes to bone.  There are 2 Wagner grade 1 ulcers beneath the first and fourth metatarsal after informed consent a 10 blade knife was used to debride the skin and soft tissue back to healthy viable tissue these 2 ulcers measures 2 cm in diameter.  The fourth metatarsal ulcer now probes to bone.  Last hemoglobin A1c was in 2021.  Imaging: No results found. No images are attached to the encounter.  Labs: Lab Results  Component Value Date   HGBA1C 10.2 (A) 07/07/2019   HGBA1C 9.6 (A) 10/02/2018   HGBA1C 11.1 (H) 07/29/2018   ESRSEDRATE 3 07/07/2019   ESRSEDRATE 69 (H) 10/06/2018   ESRSEDRATE 74 (H) 10/05/2018   CRP 13.7 (H) 10/06/2018   CRP  19.7 (H) 10/05/2018   CRP 19.7 (H) 10/04/2018   REPTSTATUS 02/04/2020 FINAL 01/30/2020   GRAMSTAIN  01/30/2020    RARE WBC PRESENT, PREDOMINANTLY PMN RARE GRAM POSITIVE COCCI    CULT  01/30/2020    FEW ENTEROBACTER CLOACAE FEW ENTEROCOCCUS FAECALIS NO ANAEROBES ISOLATED Performed at [facility], [address], [place], [place] [address]    LABORGA ENTEROBACTER CLOACAE 01/30/2020   LABORGA ENTEROCOCCUS FAECALIS 01/30/2020     Lab Results  Component Value Date   ALBUMIN 3.9 10/03/2018   ALBUMIN 4.7 08/05/2018   ALBUMIN 3.9 07/29/2018   PREALBUMIN 9.6 (L) 10/04/2018    No results found for: "MG" No results found for: "VD25OH"  Lab Results  Component Value Date   PREALBUMIN 9.6 (L) 10/04/2018      Latest Ref Rng & Units 08/16/2022    6:30 AM 10/22/2020    5:50 AM 01/27/2020    7:41 AM  CBC EXTENDED  WBC 4.0 - 10.5 K/uL 6.6  7.3  5.8   RBC 4.22 - 5.81 MIL/uL 4.42  5.01  4.70   Hemoglobin 13.0 - 17.0 g/dL 74.2  59.5  63.8   HCT 39.0 - 52.0 % 38.5  43.2  41.4   Platelets 150 - 400 K/uL 222  216  343      There  is no height or weight on file to calculate BMI.  Orders:  Orders Placed This Encounter  Procedures   XR Foot Complete Left   MR Foot Left w/o contrast   No orders of the defined types were placed in this encounter.    Procedures: No procedures performed  Clinical Data: No additional findings.  ROS:  All other systems negative, except as noted in the HPI. Review of Systems  Objective: Vital Signs: There were no vitals taken for this visit.  Specialty Comments:  No specialty comments available.  PMFS History: Patient Active Problem List   Diagnosis Date Noted   Dehiscence of amputation stump of left lower extremity (HCC) 08/16/2022   Subacute osteomyelitis, right ankle and foot (HCC)    Osteomyelitis of great toe of right foot (HCC)    History of amputation of lesser toe of left foot (HCC) 10/16/2018   Subacute osteomyelitis, left  ankle and foot (HCC)    Diabetic infection of left foot (HCC) 10/04/2018   Diabetic infection of right foot (HCC) 10/04/2018   Diabetic polyneuropathy associated with type 2 diabetes mellitus (HCC)    Past Medical History:  Diagnosis Date   Diabetes mellitus without complication (HCC)    Type II   Infection 10/04/2018   LEFT FOOT   Osteomyelitis of great toe of left foot (HCC) 07/29/2018   Subacute osteomyelitis, left ankle and foot (HCC)     Family History  Problem Relation Age of Onset   Diabetes Mother     Past Surgical History:  Procedure Laterality Date   AMPUTATION Left 07/31/2018   Procedure: LEFT GREAT TOE AMPUTATION;  Surgeon: [name], MD;  Location: [facility] OR;  Service: Orthopedics;  Laterality: Left;   AMPUTATION Left 10/09/2018   Procedure: LEFT FOOT 2ND TOE AND POSSIBLE 3RD TOE AMPUTATION;  Surgeon: [name], MD;  Location: [facility] OR;  Service: Orthopedics;  Laterality: Left;   AMPUTATION Right 03/17/2020   Procedure: RIGHT GREAT TOE AMPUTATION THROUGH METATARSAL;  Surgeon: [name], MD;  Location: [facility] OR;  Service: Orthopedics;  Laterality: Right;   AMPUTATION Left 10/22/2020   Procedure: LEFT TRANSMETATARSAL AMPUTATION;  Surgeon: [name], MD;  Location: [facility] OR;  Service: Orthopedics;  Laterality: Left;   APPLICATION OF WOUND VAC Right 03/17/2020   Procedure: APPLICATION OF WOUND VAC;  Surgeon: [name], MD;  Location: [facility] OR;  Service: Orthopedics;  Laterality: Right;   I & D EXTREMITY Right 01/30/2020   Procedure: DEBRIDEMENT ABSCESS RIGHT FOOT;  Surgeon: [name], MD;  Location: [facility] OR;  Service: Orthopedics;  Laterality: Right;   I & D EXTREMITY Right 04/07/2020   Procedure: PARTIAL TARSAL EXCISION RIGHT FOOT;  Surgeon: [name], MD;  Location: [facility] OR;  Service: Orthopedics;  Laterality: Right;   STUMP REVISION Left 08/16/2022   Procedure: REVISION LEFT TRANSMETATARSAL AMPUTATION;  Surgeon: [name], MD;  Location: [facility] OR;  Service:  Orthopedics;  Laterality: Left;   Social History   Occupational History   Not on file  Tobacco Use   Smoking status: Former    Current packs/day: 0.00    Types: Cigarettes    Start date: 20    Quit date: 2007    Years since quitting: 17.8   Smokeless tobacco: Never   Tobacco comments:    03/16/20- quit 12- 15 years ago  Vaping Use   Vaping status: Never Used  Substance and Sexual Activity   Alcohol use: Yes  Alcohol/week: 6.0 - 8.0 standard drinks of alcohol    Types: 6 - 8 Cans of beer per week   Drug use: Never   Sexual activity: Yes

## 2023-02-20 NOTE — Progress Notes (Signed)
Mri lef

## 2023-03-07 ENCOUNTER — Inpatient Hospital Stay
Admission: RE | Admit: 2023-03-07 | Discharge: 2023-03-07 | Discharge status: Home / Self Care | Payer: Self-pay | Source: Ambulatory Visit | Attending: Orthopedic Surgery | Admitting: Orthopedic Surgery

## 2023-03-07 DIAGNOSIS — Z89432 Acquired absence of left foot: Secondary | ICD-10-CM

## 2023-03-09 ENCOUNTER — Telehealth: Payer: Self-pay

## 2023-03-09 ENCOUNTER — Other Ambulatory Visit: Payer: Self-pay | Admitting: Family

## 2023-03-09 MED ORDER — SULFAMETHOXAZOLE-TRIMETHOPRIM 800-160 MG PO TABS: 1.0000 | ORAL_TABLET | Freq: Two times a day (BID) | ORAL | 0 refills | Status: DC

## 2023-03-09 NOTE — Telephone Encounter (Signed)
added

## 2023-03-09 NOTE — Telephone Encounter (Signed)
Patient's friend called stating that patient may have a possible infection stated that wound has a smell, some drainage, and that patient had a high fever.  Would like a call back to discuss.  Cb# [phone].  Please advise.  Thank you.

## 2023-03-09 NOTE — Telephone Encounter (Signed)
Will you add to duda on Monday at 10 am

## 2023-03-10 ENCOUNTER — Inpatient Hospital Stay (HOSPITAL_COMMUNITY)
Admission: EM | Admit: 2023-03-10 | Discharge: 2023-03-16 | DRG: 616 | Disposition: A | Discharge status: Home / Self Care | Payer: MEDICAID | Attending: Internal Medicine | Admitting: Internal Medicine

## 2023-03-10 ENCOUNTER — Encounter (HOSPITAL_COMMUNITY): Payer: Self-pay | Admitting: Emergency Medicine

## 2023-03-10 ENCOUNTER — Other Ambulatory Visit: Payer: Self-pay

## 2023-03-10 ENCOUNTER — Emergency Department (HOSPITAL_COMMUNITY): Payer: MEDICAID

## 2023-03-10 DIAGNOSIS — M86172 Other acute osteomyelitis, left ankle and foot: Secondary | ICD-10-CM | POA: Diagnosis present

## 2023-03-10 DIAGNOSIS — L089 Local infection of the skin and subcutaneous tissue, unspecified: Secondary | ICD-10-CM | POA: Diagnosis present

## 2023-03-10 DIAGNOSIS — M869 Osteomyelitis, unspecified: Principal | ICD-10-CM | POA: Diagnosis present

## 2023-03-10 DIAGNOSIS — Z79899 Other long term (current) drug therapy: Secondary | ICD-10-CM

## 2023-03-10 DIAGNOSIS — Z888 Allergy status to other drugs, medicaments and biological substances status: Secondary | ICD-10-CM | POA: Diagnosis not present

## 2023-03-10 DIAGNOSIS — Z87891 Personal history of nicotine dependence: Secondary | ICD-10-CM

## 2023-03-10 DIAGNOSIS — D638 Anemia in other chronic diseases classified elsewhere: Secondary | ICD-10-CM | POA: Diagnosis present

## 2023-03-10 DIAGNOSIS — M86272 Subacute osteomyelitis, left ankle and foot: Secondary | ICD-10-CM | POA: Diagnosis present

## 2023-03-10 DIAGNOSIS — E1165 Type 2 diabetes mellitus with hyperglycemia: Secondary | ICD-10-CM | POA: Diagnosis present

## 2023-03-10 DIAGNOSIS — E119 Type 2 diabetes mellitus without complications: Secondary | ICD-10-CM

## 2023-03-10 DIAGNOSIS — E43 Unspecified severe protein-calorie malnutrition: Secondary | ICD-10-CM | POA: Diagnosis present

## 2023-03-10 DIAGNOSIS — D75839 Thrombocytosis, unspecified: Secondary | ICD-10-CM | POA: Diagnosis present

## 2023-03-10 DIAGNOSIS — Z833 Family history of diabetes mellitus: Secondary | ICD-10-CM

## 2023-03-10 DIAGNOSIS — L97529 Non-pressure chronic ulcer of other part of left foot with unspecified severity: Secondary | ICD-10-CM | POA: Diagnosis present

## 2023-03-10 DIAGNOSIS — Z91030 Bee allergy status: Secondary | ICD-10-CM

## 2023-03-10 DIAGNOSIS — Z23 Encounter for immunization: Secondary | ICD-10-CM

## 2023-03-10 DIAGNOSIS — Z794 Long term (current) use of insulin: Secondary | ICD-10-CM | POA: Diagnosis not present

## 2023-03-10 DIAGNOSIS — Z6824 Body mass index (BMI) 24.0-24.9, adult: Secondary | ICD-10-CM

## 2023-03-10 DIAGNOSIS — Z5941 Food insecurity: Secondary | ICD-10-CM | POA: Diagnosis not present

## 2023-03-10 DIAGNOSIS — Z89411 Acquired absence of right great toe: Secondary | ICD-10-CM | POA: Diagnosis not present

## 2023-03-10 DIAGNOSIS — Z603 Acculturation difficulty: Secondary | ICD-10-CM | POA: Diagnosis present

## 2023-03-10 DIAGNOSIS — E11621 Type 2 diabetes mellitus with foot ulcer: Secondary | ICD-10-CM | POA: Diagnosis present

## 2023-03-10 DIAGNOSIS — E871 Hypo-osmolality and hyponatremia: Secondary | ICD-10-CM | POA: Diagnosis present

## 2023-03-10 DIAGNOSIS — E1169 Type 2 diabetes mellitus with other specified complication: Principal | ICD-10-CM | POA: Diagnosis present

## 2023-03-10 DIAGNOSIS — D649 Anemia, unspecified: Secondary | ICD-10-CM

## 2023-03-10 LAB — COMPREHENSIVE METABOLIC PANEL
ALT: 14 U/L (ref 0–44)
AST: 11 U/L — ABNORMAL LOW (ref 15–41)
Albumin: 3 g/dL — ABNORMAL LOW (ref 3.5–5.0)
Alkaline Phosphatase: 81 U/L (ref 38–126)
Anion gap: 8 (ref 5–15)
BUN: 28 mg/dL — ABNORMAL HIGH (ref 6–20)
CO2: 25 mmol/L (ref 22–32)
Calcium: 8.5 mg/dL — ABNORMAL LOW (ref 8.9–10.3)
Chloride: 102 mmol/L (ref 98–111)
Creatinine, Ser: 1.19 mg/dL (ref 0.61–1.24)
GFR, Estimated: >60 mL/min (ref >60)
Glucose, Bld: 149 mg/dL — ABNORMAL HIGH (ref 70–99)
Potassium: 3.8 mmol/L (ref 3.5–5.1)
Sodium: 135 mmol/L (ref 135–145)
Total Bilirubin: 0.4 mg/dL (ref <1.2)
Total Protein: 7.6 g/dL (ref 6.5–8.1)

## 2023-03-10 LAB — CBC WITH DIFFERENTIAL/PLATELET
Abs Immature Granulocytes: 0.06 10*3/uL (ref 0.00–0.07)
Basophils Absolute: 0 10*3/uL (ref 0.0–0.1)
Basophils Relative: 0 %
Eosinophils Absolute: 0 10*3/uL (ref 0.0–0.5)
Eosinophils Relative: 0 %
HCT: 27.6 % — ABNORMAL LOW (ref 39.0–52.0)
Hemoglobin: 9.4 g/dL — ABNORMAL LOW (ref 13.0–17.0)
Immature Granulocytes: 1 %
Lymphocytes Relative: 6 %
Lymphs Abs: 0.7 10*3/uL (ref 0.7–4.0)
MCH: 30 pg (ref 26.0–34.0)
MCHC: 34.1 g/dL (ref 30.0–36.0)
MCV: 88.2 fL (ref 80.0–100.0)
Monocytes Absolute: 1 10*3/uL (ref 0.1–1.0)
Monocytes Relative: 8 %
Neutro Abs: 10.6 10*3/uL — ABNORMAL HIGH (ref 1.7–7.7)
Neutrophils Relative %: 85 %
Platelets: 303 10*3/uL (ref 150–400)
RBC: 3.13 MIL/uL — ABNORMAL LOW (ref 4.22–5.81)
RDW: 12.1 % (ref 11.5–15.5)
WBC: 12.4 10*3/uL — ABNORMAL HIGH (ref 4.0–10.5)
nRBC: 0 % (ref 0.0–0.2)

## 2023-03-10 LAB — URINALYSIS, ROUTINE W REFLEX MICROSCOPIC
Bilirubin Urine: NEGATIVE
Glucose, UA: >=500 mg/dL — AB
Hgb urine dipstick: NEGATIVE
Ketones, ur: NEGATIVE mg/dL
Leukocytes,Ua: NEGATIVE
Nitrite: NEGATIVE
Protein, ur: 30 mg/dL — AB
Specific Gravity, Urine: 1.02 (ref 1.005–1.030)
pH: 6 (ref 5.0–8.0)

## 2023-03-10 LAB — TROPONIN I (HIGH SENSITIVITY)
Troponin I (High Sensitivity): 9 ng/L (ref <18)
Troponin I (High Sensitivity): 7 ng/L (ref <18)

## 2023-03-10 LAB — GLUCOSE, CAPILLARY: Glucose-Capillary: 205 mg/dL — ABNORMAL HIGH (ref 70–99)

## 2023-03-10 LAB — CBG MONITORING, ED: Glucose-Capillary: 151 mg/dL — ABNORMAL HIGH (ref 70–99)

## 2023-03-10 MED ORDER — INSULIN ASPART 100 UNIT/ML IJ SOLN
0.0000 [IU] | Freq: Every day | INTRAMUSCULAR | Status: DC
  Administered 2023-03-10: 2 [IU] via SUBCUTANEOUS
  Administered 2023-03-11 – 2023-03-12 (×2): 3 [IU] via SUBCUTANEOUS
  Administered 2023-03-13: 5 [IU] via SUBCUTANEOUS
  Administered 2023-03-14: 4 [IU] via SUBCUTANEOUS
  Filled 2023-03-10: qty 0.05

## 2023-03-10 MED ORDER — OXYCODONE HCL 5 MG PO TABS: 5.0000 mg | ORAL_TABLET | ORAL | Status: DC | PRN

## 2023-03-10 MED ORDER — ACETAMINOPHEN 325 MG PO TABS
650.0000 mg | ORAL_TABLET | Freq: Four times a day (QID) | ORAL | Status: DC | PRN
  Administered 2023-03-13: 650 mg via ORAL
  Filled 2023-03-10 (×2): qty 2

## 2023-03-10 MED ORDER — SENNOSIDES-DOCUSATE SODIUM 8.6-50 MG PO TABS: 1.0000 | ORAL_TABLET | Freq: Every evening | ORAL | Status: DC | PRN

## 2023-03-10 MED ORDER — INSULIN ASPART 100 UNIT/ML IJ SOLN
0.0000 [IU] | Freq: Three times a day (TID) | INTRAMUSCULAR | Status: DC
  Administered 2023-03-10: 2 [IU] via SUBCUTANEOUS
  Administered 2023-03-11: 3 [IU] via SUBCUTANEOUS
  Administered 2023-03-11 (×2): 2 [IU] via SUBCUTANEOUS
  Administered 2023-03-12: 5 [IU] via SUBCUTANEOUS
  Administered 2023-03-12: 1 [IU] via SUBCUTANEOUS
  Administered 2023-03-12: 5 [IU] via SUBCUTANEOUS
  Administered 2023-03-13: 2 [IU] via SUBCUTANEOUS
  Administered 2023-03-13: 5 [IU] via SUBCUTANEOUS
  Administered 2023-03-13: 7 [IU] via SUBCUTANEOUS
  Administered 2023-03-14: 2 [IU] via SUBCUTANEOUS
  Administered 2023-03-14: 3 [IU] via SUBCUTANEOUS
  Administered 2023-03-15: 9 [IU] via SUBCUTANEOUS
  Administered 2023-03-15: 1 [IU] via SUBCUTANEOUS
  Administered 2023-03-15: 5 [IU] via SUBCUTANEOUS
  Administered 2023-03-16 (×2): 2 [IU] via SUBCUTANEOUS
  Filled 2023-03-10: qty 0.09

## 2023-03-10 MED ORDER — HEPARIN SODIUM (PORCINE) 5000 UNIT/ML IJ SOLN
5000.0000 [IU] | Freq: Three times a day (TID) | INTRAMUSCULAR | Status: DC
  Administered 2023-03-10 – 2023-03-16 (×16): 5000 [IU] via SUBCUTANEOUS
  Filled 2023-03-10 (×17): qty 1

## 2023-03-10 MED ORDER — SODIUM CHLORIDE 0.9 % IV SOLN: INTRAVENOUS | Status: AC | PRN

## 2023-03-10 MED ORDER — PIPERACILLIN-TAZOBACTAM 3.375 G IVPB 30 MIN
3.3750 g | Freq: Once | INTRAVENOUS | Status: AC
Start: 2023-03-10 — End: 2023-03-10
  Administered 2023-03-10: 3.375 g via INTRAVENOUS
  Filled 2023-03-10: qty 50

## 2023-03-10 MED ORDER — INSULIN GLARGINE-YFGN 100 UNIT/ML ~~LOC~~ SOLN
15.0000 [IU] | Freq: Every day | SUBCUTANEOUS | Status: DC
  Administered 2023-03-11: 15 [IU] via SUBCUTANEOUS
  Filled 2023-03-10 (×2): qty 0.15

## 2023-03-10 MED ORDER — INFLUENZA VIRUS VACC SPLIT PF (FLUZONE) 0.5 ML IM SUSY: 0.5000 mL | PREFILLED_SYRINGE | INTRAMUSCULAR | Status: DC

## 2023-03-10 MED ORDER — VANCOMYCIN HCL 1500 MG/300ML IV SOLN
1500.0000 mg | Freq: Once | INTRAVENOUS | Status: AC
  Administered 2023-03-10: 1500 mg via INTRAVENOUS
  Filled 2023-03-10: qty 300

## 2023-03-10 MED ORDER — PNEUMOCOCCAL 20-VAL CONJ VACC 0.5 ML IM SUSY
0.5000 mL | PREFILLED_SYRINGE | INTRAMUSCULAR | Status: AC
  Administered 2023-03-13: 0.5 mL via INTRAMUSCULAR
  Filled 2023-03-10: qty 0.5

## 2023-03-10 MED ORDER — SODIUM CHLORIDE 0.9 % IV SOLN
2.0000 g | Freq: Three times a day (TID) | INTRAVENOUS | Status: DC
  Administered 2023-03-10 – 2023-03-15 (×13): 2 g via INTRAVENOUS
  Filled 2023-03-10 (×14): qty 12.5

## 2023-03-10 MED ORDER — ONDANSETRON HCL 4 MG PO TABS: 4.0000 mg | ORAL_TABLET | Freq: Four times a day (QID) | ORAL | Status: DC | PRN

## 2023-03-10 MED ORDER — HYDROMORPHONE HCL 1 MG/ML IJ SOLN: 0.5000 mg | INTRAMUSCULAR | Status: DC | PRN

## 2023-03-10 MED ORDER — ACETAMINOPHEN 650 MG RE SUPP: 650.0000 mg | Freq: Four times a day (QID) | RECTAL | Status: DC | PRN

## 2023-03-10 MED ORDER — METRONIDAZOLE 500 MG PO TABS
500.0000 mg | ORAL_TABLET | Freq: Two times a day (BID) | ORAL | Status: DC
  Administered 2023-03-10 – 2023-03-15 (×10): 500 mg via ORAL
  Filled 2023-03-10 (×10): qty 1

## 2023-03-10 MED ORDER — SALINE SPRAY 0.65 % NA SOLN
1.0000 | NASAL | Status: DC | PRN
  Administered 2023-03-10: 1 via NASAL
  Filled 2023-03-10: qty 44

## 2023-03-10 MED ORDER — ONDANSETRON HCL 4 MG/2ML IJ SOLN: 4.0000 mg | Freq: Four times a day (QID) | INTRAMUSCULAR | Status: DC | PRN

## 2023-03-10 MED ORDER — VANCOMYCIN HCL 750 MG/150ML IV SOLN
750.0000 mg | Freq: Two times a day (BID) | INTRAVENOUS | Status: DC
  Administered 2023-03-11: 750 mg via INTRAVENOUS
  Filled 2023-03-10: qty 150

## 2023-03-10 MED ORDER — MUPIROCIN 2 % EX OINT
1.0000 | TOPICAL_OINTMENT | Freq: Two times a day (BID) | CUTANEOUS | Status: AC
  Administered 2023-03-11 – 2023-03-15 (×9): 1 via NASAL
  Filled 2023-03-10 (×2): qty 22

## 2023-03-10 NOTE — H&P (Signed)
History and Physical    Jon House NWG:[id number] DOB: 1971-04-11 DOA: 03/10/2023  PCP: Patient, No Pcp Per   Patient coming from: Home   Chief Complaint: Left foot wound with foul odor and drainage, fever, N/V    HPI: Jon House is a 52 y.o. male with medical history significant for poorly controlled diabetes mellitus and diabetic foot infections with osteomyelitis status post toe amputation on the right and left TMA with revision in April 2024 who presents with pain, foul odor, and purulent drainage from the left foot.    Patient reports that the left foot has continued to worsen with increasing pain, drainage, and odor despite just completing a course of doxycycline.  He has developed fever and general malaise with nausea and nonbloody vomiting.  ED Course: Upon arrival to the ED, patient is found to be afebrile and saturating well on room air with mild tachycardia and systolic blood pressure of 99 and greater.  Labs are most notable for WBC 12,400 and hemoglobin 9.4.  Plain radiographs are concerning for osteomyelitis of the fifth metatarsal on the left.  Orthopedic surgery was consulted by the ED PA, blood cultures were collected, and the patient was treated with vancomycin and Zosyn.  Review of Systems:  All other systems reviewed and apart from HPI, are negative.  Past Medical History:  Diagnosis Date   Diabetes mellitus without complication (HCC)    Type II   Infection 10/04/2018   LEFT FOOT   Osteomyelitis of great toe of left foot (HCC) 07/29/2018   Subacute osteomyelitis, left ankle and foot [facility])     Past Surgical History:  Procedure Laterality Date   AMPUTATION Left 07/31/2018   Procedure: LEFT GREAT TOE AMPUTATION;  Surgeon: [name], MD;  Location: [facility] OR;  Service: Orthopedics;  Laterality: Left;   AMPUTATION Left 10/09/2018   Procedure: LEFT FOOT 2ND TOE AND POSSIBLE 3RD TOE AMPUTATION;  Surgeon: [name], MD;  Location: [facility] OR;   Service: Orthopedics;  Laterality: Left;   AMPUTATION Right 03/17/2020   Procedure: RIGHT GREAT TOE AMPUTATION THROUGH METATARSAL;  Surgeon: [name], MD;  Location: [facility] OR;  Service: Orthopedics;  Laterality: Right;   AMPUTATION Left 10/22/2020   Procedure: LEFT TRANSMETATARSAL AMPUTATION;  Surgeon: [name], MD;  Location: [facility] OR;  Service: Orthopedics;  Laterality: Left;   APPLICATION OF WOUND VAC Right 03/17/2020   Procedure: APPLICATION OF WOUND VAC;  Surgeon: [name], MD;  Location: [facility] OR;  Service: Orthopedics;  Laterality: Right;   I & D EXTREMITY Right 01/30/2020   Procedure: DEBRIDEMENT ABSCESS RIGHT FOOT;  Surgeon: [name], MD;  Location: [facility] OR;  Service: Orthopedics;  Laterality: Right;   I & D EXTREMITY Right 04/07/2020   Procedure: PARTIAL TARSAL EXCISION RIGHT FOOT;  Surgeon: [name], MD;  Location: [facility] OR;  Service: Orthopedics;  Laterality: Right;   STUMP REVISION Left 08/16/2022   Procedure: REVISION LEFT TRANSMETATARSAL AMPUTATION;  Surgeon: [name], MD;  Location: [facility] OR;  Service: Orthopedics;  Laterality: Left;    Social History:   reports that he quit smoking about 17 years ago. His smoking use included cigarettes. He started smoking about 44 years ago. He has never used smokeless tobacco. He reports current alcohol use of about 6.0 - 8.0 standard drinks of alcohol per week. He reports that he does not use drugs.  Allergies  Allergen Reactions   Bee Venom Hives   Metformin And Related Diarrhea  Family History  Problem Relation Age of Onset   Diabetes Mother      Prior to Admission medications   Medication Sig Start Date End Date Taking? Authorizing Provider  sulfamethoxazole-trimethoprim (BACTRIM DS) 800-160 MG tablet Take 1 tablet by mouth 2 (two) times daily. 03/09/23   Adonis Huguenin, NP  amoxicillin-clavulanate (AUGMENTIN) 500-125 MG tablet Take 1 tablet by mouth 3 (three) times daily. 09/01/22   Adonis Huguenin, NP  Blood  Glucose Monitoring Suppl (TRUE METRIX METER) w/Device KIT Use to check blood sugars up to  3 times daily 08/05/18   Fulp, Cammie, MD  doxycycline (VIBRA-TABS) 100 MG tablet Take 1 tablet (100 mg total) by mouth 2 (two) times daily. 02/20/23   [name], MD  glucose blood (TRUE METRIX BLOOD GLUCOSE TEST) test strip Use as instructed to check blood sugars up to 3 x daily 07/07/19   Rema Fendt, NP  ibuprofen (ADVIL) 200 MG tablet Take 200 mg by mouth every 6 (six) hours as needed for moderate pain.    [provider]  insulin NPH-regular Human (70-30) 100 UNIT/ML injection Inject 10-20 Units into the skin 2 (two) times daily with a meal.    [provider]  Insulin Pen Needle (B-D UF III MINI PEN NEEDLES) 31G X 5 MM MISC Use as instructed. Inject into the skin once nightly. 07/07/19   Rema Fendt, NP  Omega-3 Fatty Acids (FISH OIL PO) Take 1 capsule by mouth daily.    [provider]  oxyCODONE-acetaminophen (PERCOCET/ROXICET) 5-325 MG tablet Take 1 tablet by mouth every 4 (four) hours as needed. 08/30/22   Adonis Huguenin, NP  oxymetazoline (AFRIN ALL NIGHT NODRIP) 0.05 % nasal spray Place 1 spray into both nostrils at bedtime.    [provider]  TRUEplus Lancets 28G MISC Use to help check blood sugars up to 3 times per day 08/05/18   Cain Saupe, MD    Physical Exam: Vitals:   03/10/23 1116 03/10/23 1235 03/10/23 1237 03/10/23 1634  BP:   107/61 111/63  Pulse:   97 93  Resp:   18 18  Temp:  99.7 F (37.6 C)  99 F (37.2 C)  TempSrc:  Oral  Oral  SpO2:   97% 98%  Weight: 67.6 kg     Height: 5\' 6"  (1.676 m)       Constitutional: NAD, calm  Eyes: PERTLA, lids and conjunctivae normal ENMT: Mucous membranes are moist. Posterior pharynx clear of any exudate or lesions.   Neck: supple, no masses  Respiratory: no wheezing, no crackles. No accessory muscle use.  Cardiovascular: S1 & S2 heard, regular rate and rhythm. No extremity edema.   Abdomen: No  distension, no tenderness, soft. Bowel sounds active.  Musculoskeletal: no clubbing / cyanosis. No joint deformity upper and lower extremities.   Skin: Ulcerations along left TMA stump. Warm, dry, well-perfused. Neurologic: CN 2-12 grossly intact. Moving all extremities. Alert and oriented.  Psychiatric: Pleasant. Cooperative.    Labs and Imaging on Admission: I have personally reviewed following labs and imaging studies  CBC: Recent Labs  Lab 03/10/23 1426  WBC 12.4*  NEUTROABS 10.6*  HGB 9.4*  HCT 27.6*  MCV 88.2  PLT 303   Basic Metabolic Panel: Recent Labs  Lab 03/10/23 1426  NA 135  K 3.8  CL 102  CO2 25  GLUCOSE 149*  BUN 28*  CREATININE 1.19  CALCIUM 8.5*   GFR: Estimated Creatinine Clearance: 64 mL/min (by  C-G formula based on SCr of 1.19 mg/dL). Liver Function Tests: Recent Labs  Lab 03/10/23 1426  AST 11*  ALT 14  ALKPHOS 81  BILITOT 0.4  PROT 7.6  ALBUMIN 3.0*   No results for input(s): "LIPASE", "AMYLASE" in the last 168 hours. No results for input(s): "AMMONIA" in the last 168 hours. Coagulation Profile: No results for input(s): "INR", "PROTIME" in the last 168 hours. Cardiac Enzymes: No results for input(s): "CKTOTAL", "CKMB", "CKMBINDEX", "TROPONINI" in the last 168 hours. BNP (last 3 results) No results for input(s): "PROBNP" in the last 8760 hours. HbA1C: No results for input(s): "HGBA1C" in the last 72 hours. CBG: No results for input(s): "GLUCAP" in the last 168 hours. Lipid Profile: No results for input(s): "CHOL", "HDL", "LDLCALC", "TRIG", "CHOLHDL", "LDLDIRECT" in the last 72 hours. Thyroid Function Tests: No results for input(s): "TSH", "T4TOTAL", "FREET4", "T3FREE", "THYROIDAB" in the last 72 hours. Anemia Panel: No results for input(s): "VITAMINB12", "FOLATE", "FERRITIN", "TIBC", "IRON", "RETICCTPCT" in the last 72 hours. Urine analysis:    Component Value Date/Time   COLORURINE YELLOW 03/10/2023 1605   APPEARANCEUR HAZY (A)  03/10/2023 1605   LABSPEC 1.020 03/10/2023 1605   PHURINE 6.0 03/10/2023 1605   GLUCOSEU >=500 (A) 03/10/2023 1605   HGBUR NEGATIVE 03/10/2023 1605   BILIRUBINUR NEGATIVE 03/10/2023 1605   KETONESUR NEGATIVE 03/10/2023 1605   PROTEINUR 30 (A) 03/10/2023 1605   NITRITE NEGATIVE 03/10/2023 1605   LEUKOCYTESUR NEGATIVE 03/10/2023 1605   Sepsis Labs: @LABRCNTIP (procalcitonin:4,lacticidven:4) )No results found for this or any previous visit (from the past 240 hour(s)).   Radiological Exams on Admission: DG Chest 2 View  Result Date: 03/10/2023 CLINICAL DATA:  Chest pain. EXAM: CHEST - 2 VIEW COMPARISON:  None Available. FINDINGS: The heart size and mediastinal contours are within normal limits. There is mild bibasilar atelectasis/airspace disease. No pleural effusion or pneumothorax. The visualized skeletal structures are unremarkable. IMPRESSION: Mild bibasilar atelectasis/airspace disease. Electronically Signed   By: Romona Curls M.D.   On: 03/10/2023 14:53   DG Foot Complete Left  Result Date: 03/10/2023 CLINICAL DATA:  Foot pain, concern for osteomyelitis. EXAM: LEFT FOOT - COMPLETE 3+ VIEW COMPARISON:  Foot radiograph dated 02/20/2023. FINDINGS: The patient is status post foot amputation at the level of the metatarsals. No acute fracture is identified. There is osseous demineralization of the fifth metatarsal with surrounding soft tissue swelling, increased since 02/20/2023. IMPRESSION: Osseous demineralization of the fifth metatarsal with surrounding soft tissue swelling, concerning for osteomyelitis. Electronically Signed   By: Romona Curls M.D.   On: 03/10/2023 14:50    EKG: Independently reviewed. Sinus rhythm.   Assessment/Plan   1. Left foot infection, osteomyelitis  - Continue empiric antibiotics, check ABI, follow-up orthopedic surgery recommendations    2. Anemia  - Hgb is 9.4 on admission, down from 13.6 in April 2024  - No overt bleeding  - Check anemia panel, repeat  CBC in am   3. Insulin-dependent DM  - A1c was 10.2% in 2021  - Check CBGs and use long- and short-acting insulin      DVT prophylaxis: sq heparin  Code Status: Full  Level of Care: Level of care: Med-Surg Family Communication: None present  Disposition Plan:  Patient is from: Home  Anticipated d/c is to: TBD Anticipated d/c date is: 03/13/23  Patient currently: Pending orthopedic surgery consultation and plan  Consults called: Orthopedic surgery  Admission status: Inpatient     Briscoe Deutscher, MD [facility]  03/10/2023, 4:56 PM

## 2023-03-10 NOTE — Progress Notes (Signed)
A consult was received from an ED physician for Zosyn & vancomycin per pharmacy dosing.  The patient's profile has been reviewed for ht/wt/allergies/indication/available labs.    A one time order has been placed for Zosyn 3.375 mg & Vancomycin 1500 mg.    Further antibiotics/pharmacy consults should be ordered by admitting physician if indicated.                       Thank you,  Herby Abraham, Pharm.D Use secure chat for questions 03/10/2023 3:58 PM

## 2023-03-10 NOTE — ED Triage Notes (Signed)
Per [facility] pt coming from home- has partial amputation to left foot that has not been healing over the past year. 4 mg zofran given en route for active vomiting. Reports emesis onset of today.

## 2023-03-10 NOTE — ED Notes (Signed)
ED TO INPATIENT HANDOFF REPORT  ED Nurse Name and Phone #: Tia, RN   S Name/Age/Gender [name] 52 y.o. male Room/Bed: WA08/WA08  Code Status   Code Status: Full Code  Home/SNF/Other Home Patient oriented to: self, place, time, and situation Is this baseline? 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 4 mg zofran given en route for active vomiting. Reports emesis onset of today.    Allergies Allergies  Allergen Reactions   Bee Venom Hives   Metformin And Related Diarrhea    Level of Care/Admitting Diagnosis ED Disposition     ED Disposition  Admit   Condition  --   Comment  Hospital Area: [facility] [[id number]]  Level of Care: Med-Surg [16]  May admit patient to [facility] or [facility] if equivalent level of care is available:: No  Covid Evaluation: Asymptomatic - no recent exposure (last 10 days) testing not required  Diagnosis: Foot osteomyelitis, left [facility]) [[id number]]  Admitting Physician: Briscoe Deutscher [[id number]]  Attending Physician: Briscoe Deutscher [[id number]]  Certification:: I certify this patient will need inpatient services for at least 2 midnights  Expected Medical Readiness: 03/13/2023          B Medical/Surgery History Past Medical History:  Diagnosis Date   Diabetes mellitus without complication (HCC)    Type II   Infection 10/04/2018   LEFT FOOT   Osteomyelitis of great toe of left foot (HCC) 07/29/2018   Subacute osteomyelitis, left ankle and foot (HCC)    Past Surgical History:  Procedure Laterality Date   AMPUTATION Left 07/31/2018   Procedure: LEFT GREAT TOE AMPUTATION;  Surgeon: [name], MD;  Location: [facility] OR;  Service: Orthopedics;  Laterality: Left;   AMPUTATION Left 10/09/2018   Procedure: LEFT FOOT 2ND TOE AND POSSIBLE  3RD TOE AMPUTATION;  Surgeon: [name], MD;  Location: [facility] OR;  Service: Orthopedics;  Laterality: Left;   AMPUTATION Right 03/17/2020   Procedure: RIGHT GREAT TOE AMPUTATION THROUGH METATARSAL;  Surgeon: [name], MD;  Location: [facility] OR;  Service: Orthopedics;  Laterality: Right;   AMPUTATION Left 10/22/2020   Procedure: LEFT TRANSMETATARSAL AMPUTATION;  Surgeon: [name], MD;  Location: [facility] OR;  Service: Orthopedics;  Laterality: Left;   APPLICATION OF WOUND VAC Right 03/17/2020   Procedure: APPLICATION OF WOUND VAC;  Surgeon: [name], MD;  Location: [facility] OR;  Service: Orthopedics;  Laterality: Right;   I & D EXTREMITY Right 01/30/2020   Procedure: DEBRIDEMENT ABSCESS RIGHT FOOT;  Surgeon: [name], MD;  Location: [facility] OR;  Service: Orthopedics;  Laterality: Right;   I & D EXTREMITY Right 04/07/2020   Procedure: PARTIAL TARSAL EXCISION RIGHT FOOT;  Surgeon: [name], MD;  Location: [facility] OR;  Service: Orthopedics;  Laterality: Right;   STUMP REVISION Left 08/16/2022   Procedure: REVISION LEFT TRANSMETATARSAL AMPUTATION;  Surgeon: [name], MD;  Location: [facility] OR;  Service: Orthopedics;  Laterality: Left;     A IV Location/Drains/Wounds Patient Lines/Drains/Airways Status     Active Line/Drains/Airways     Name Placement date Placement time Site Days   Peripheral IV 08/16/22 20 G 1.16" Right;Posterior Hand 08/16/22  0720  Hand  206   Peripheral IV 03/10/23 20 G 1" Left Antecubital 03/10/23  1045  Antecubital  less than 1   Peripheral IV 03/10/23 20 G 1" Anterior;Distal;Right;Upper Arm 03/10/23  1605  Arm  less than 1   Open Drain 1 Right;Anterior Foot  01/30/20  1419  Foot  1135   Negative Pressure Wound Therapy Foot Anterior;Right 03/17/20  1041  --  1088   Negative Pressure Wound Therapy Right 04/07/20  0959  --  1067            Intake/Output Last 24 hours  Intake/Output Summary (Last 24 hours) at 03/10/2023 1706 Last data filed at 03/10/2023  1706 Gross per 24 hour  Intake 43 ml  Output --  Net 43 ml    Labs/Imaging Results for orders placed or performed during the hospital encounter of 03/10/23 (from the past 48 hour(s))  CBC with Differential     Status: Abnormal   Collection Time: 03/10/23  2:26 PM  Result Value Ref Range   WBC 12.4 (H) 4.0 - 10.5 K/uL   RBC 3.13 (L) 4.22 - 5.81 MIL/uL   Hemoglobin 9.4 (L) 13.0 - 17.0 g/dL   HCT 62.1 (L) 30.8 - 65.7 %   MCV 88.2 80.0 - 100.0 fL   MCH 30.0 26.0 - 34.0 pg   MCHC 34.1 30.0 - 36.0 g/dL   RDW 84.6 96.2 - 95.2 %   Platelets 303 150 - 400 K/uL   nRBC 0.0 0.0 - 0.2 %   Neutrophils Relative % 85 %   Neutro Abs 10.6 (H) 1.7 - 7.7 K/uL   Lymphocytes Relative 6 %   Lymphs Abs 0.7 0.7 - 4.0 K/uL   Monocytes Relative 8 %   Monocytes Absolute 1.0 0.1 - 1.0 K/uL   Eosinophils Relative 0 %   Eosinophils Absolute 0.0 0.0 - 0.5 K/uL   Basophils Relative 0 %   Basophils Absolute 0.0 0.0 - 0.1 K/uL   Immature Granulocytes 1 %   Abs Immature Granulocytes 0.06 0.00 - 0.07 K/uL    Comment: Performed at [facility], [address] [place], [place] [address]  Comprehensive metabolic panel     Status: Abnormal   Collection Time: 03/10/23  2:26 PM  Result Value Ref Range   Sodium 135 135 - 145 mmol/L   Potassium 3.8 3.5 - 5.1 mmol/L   Chloride 102 98 - 111 mmol/L   CO2 25 22 - 32 mmol/L   Glucose, Bld 149 (H) 70 - 99 mg/dL    Comment: Glucose reference range applies only to samples taken after fasting for at least 8 hours.   BUN 28 (H) 6 - 20 mg/dL   Creatinine, Ser 4.40 0.61 - 1.24 mg/dL   Calcium 8.5 (L) 8.9 - 10.3 mg/dL   Total Protein 7.6 6.5 - 8.1 g/dL   Albumin 3.0 (L) 3.5 - 5.0 g/dL   AST 11 (L) 15 - 41 U/L   ALT 14 0 - 44 U/L   Alkaline Phosphatase 81 38 - 126 U/L   Total Bilirubin 0.4 <1.2 mg/dL   GFR, Estimated >10 >27 mL/min    Comment: (NOTE) Calculated using the CKD-EPI Creatinine Equation (2021)    Anion gap 8 5 - 15    Comment:  Performed at [facility], [address] [place], [place] [address]  Troponin I (High Sensitivity)     Status: None   Collection Time: 03/10/23  2:26 PM  Result Value Ref Range   Troponin I (High Sensitivity) 9 <18 ng/L    Comment: (NOTE) Elevated high  sensitivity troponin I (hsTnI) values and significant  changes across serial measurements may suggest ACS but many other  chronic and acute conditions are known to elevate hsTnI results.  Refer to the "Links" section for chest pain algorithms and additional  guidance. Performed at [facility], [address] [place], [place] [address]   Troponin I (High Sensitivity)     Status: None   Collection Time: 03/10/23  4:03 PM  Result Value Ref Range   Troponin I (High Sensitivity) 7 <18 ng/L    Comment: (NOTE) Elevated high sensitivity troponin I (hsTnI) values and significant  changes across serial measurements may suggest ACS but many other  chronic and acute conditions are known to elevate hsTnI results.  Refer to the "Links" section for chest pain algorithms and additional  guidance. Performed at [facility], [address] [place], [place] [address]   Urinalysis, Routine w reflex microscopic -Urine, Clean Catch     Status: Abnormal   Collection Time: 03/10/23  4:05 PM  Result Value Ref Range   Color, Urine YELLOW YELLOW   APPearance HAZY (A) CLEAR   Specific Gravity, Urine 1.020 1.005 - 1.030   pH 6.0 5.0 - 8.0   Glucose, UA >=500 (A) NEGATIVE mg/dL   Hgb urine dipstick NEGATIVE NEGATIVE   Bilirubin Urine NEGATIVE NEGATIVE   Ketones, ur NEGATIVE NEGATIVE mg/dL   Protein, ur 30 (A) NEGATIVE mg/dL   Nitrite NEGATIVE NEGATIVE   Leukocytes,Ua NEGATIVE NEGATIVE   RBC / HPF 0-5 0 - 5 RBC/hpf   WBC, UA 0-5 0 - 5 WBC/hpf   Bacteria, UA RARE (A) NONE SEEN   Squamous Epithelial / HPF 0-5 0 - 5 /HPF   Mucus PRESENT     Comment: Performed at [facility],  [address], [place], [place] [address]   DG Chest 2 View  Result Date: 03/10/2023 CLINICAL DATA:  Chest pain. EXAM: CHEST - 2 VIEW COMPARISON:  None Available. FINDINGS: The heart size and mediastinal contours are within normal limits. There is mild bibasilar atelectasis/airspace disease. No pleural effusion or pneumothorax. The visualized skeletal structures are unremarkable. IMPRESSION: Mild bibasilar atelectasis/airspace disease. Electronically Signed   By: Romona Curls M.D.   On: 03/10/2023 14:53   DG Foot Complete Left  Result Date: 03/10/2023 CLINICAL DATA:  Foot pain, concern for osteomyelitis. EXAM: LEFT FOOT - COMPLETE 3+ VIEW COMPARISON:  Foot radiograph dated 02/20/2023. FINDINGS: The patient is status post foot amputation at the level of the metatarsals. No acute fracture is identified. There is osseous demineralization of the fifth metatarsal with surrounding soft tissue swelling, increased since 02/20/2023. IMPRESSION: Osseous demineralization of the fifth metatarsal with surrounding soft tissue swelling, concerning for osteomyelitis. Electronically Signed   By: Romona Curls M.D.   On: 03/10/2023 14:50    Pending Labs Unresulted Labs (From admission, onward)     Start     Ordered   03/11/23 0500  Hemoglobin A1c  Tomorrow morning,   R       Comments: To assess prior glycemic control    03/10/23 1656   03/11/23 0500  Sedimentation rate  Tomorrow morning,   R        03/10/23 1656   03/11/23 0500  C-reactive protein  Tomorrow morning,   R        03/10/23 1656   03/11/23 0500  Prealbumin  Tomorrow morning,   R        03/10/23 1656  03/11/23 0500  HIV Antibody (routine testing w rflx)  (HIV Antibody (Routine testing w reflex) panel)  Tomorrow morning,   R        03/10/23 1656   03/11/23 0500  Basic metabolic panel  Daily,   R      03/10/23 1656   03/11/23 0500  CBC  Daily,   R      03/10/23 1656   03/11/23 0500  Vitamin B12  (Anemia Panel (PNL))  Tomorrow morning,   R         03/10/23 1705   03/11/23 0500  Folate  (Anemia Panel (PNL))  Tomorrow morning,   R        03/10/23 1705   03/11/23 0500  Iron and TIBC  (Anemia Panel (PNL))  Tomorrow morning,   R        03/10/23 1705   03/11/23 0500  Ferritin  (Anemia Panel (PNL))  Tomorrow morning,   R        03/10/23 1705   03/11/23 0500  Reticulocytes  (Anemia Panel (PNL))  Tomorrow morning,   R        03/10/23 1705   03/10/23 1551  Blood culture (routine x 2)  BLOOD CULTURE X 2,   R (with STAT occurrences)      03/10/23 1551            Vitals/Pain Today's Vitals   03/10/23 1235 03/10/23 1237 03/10/23 1508 03/10/23 1634  BP:  107/61  111/63  Pulse:  97  93  Resp:  18  18  Temp: 99.7 F (37.6 C)   99 F (37.2 C)  TempSrc: Oral   Oral  SpO2:  97%  98%  Weight:      Height:      PainSc:   0-No pain     Isolation Precautions No active isolations  Medications Medications  vancomycin (VANCOREADY) IVPB 1500 mg/300 mL (1,500 mg Intravenous New Bag/Given 03/10/23 1632)  insulin aspart (novoLOG) injection 0-9 Units (has no administration in time range)  insulin glargine-yfgn (SEMGLEE) injection 15 Units (has no administration in time range)  insulin aspart (novoLOG) injection 0-5 Units (has no administration in time range)  metroNIDAZOLE (FLAGYL) tablet 500 mg (has no administration in time range)  heparin injection 5,000 Units (has no administration in time range)  acetaminophen (TYLENOL) tablet 650 mg (has no administration in time range)    Or  acetaminophen (TYLENOL) suppository 650 mg (has no administration in time range)  oxyCODONE (Oxy IR/ROXICODONE) immediate release tablet 5 mg (has no administration in time range)  HYDROmorphone (DILAUDID) injection 0.5 mg (has no administration in time range)  senna-docusate (Senokot-S) tablet 1 tablet (has no administration in time range)  ondansetron (ZOFRAN) tablet 4 mg (has no administration in time range)    Or  ondansetron (ZOFRAN) injection 4 mg  (has no administration in time range)  piperacillin-tazobactam (ZOSYN) IVPB 3.375 g (0 g Intravenous Stopped 03/10/23 1706)    Mobility walks     Focused Assessments Neuro Assessment Handoff:  Swallow screen pass?  N/A         Neuro Assessment:   Neuro Checks:      Has TPA been given? No If patient is a Neuro Trauma and patient is going to OR before floor call report to 4N Charge nurse: [phone] or [phone]   R Recommendations: See Admitting Provider Note  Report given to:   Additional Notes:

## 2023-03-10 NOTE — ED Notes (Signed)
 [facility] called to have patient transported to [facility]

## 2023-03-10 NOTE — Progress Notes (Signed)
Pharmacy Antibiotic Note  Jon House is a 52 y.o. male admitted on 03/10/2023 with left foot infection/osteomyelitis.  Pharmacy has been consulted for vancomycin & cefepime dosing.  Plan: Vancomycin 1500 mg IV given in ED @ 1632 followed by vancomycin 750 mg IV q12 for eAUC 535.5, SCr 1.19, Vd 0.73 Cefepime 2 gm IV q8h Flagyl 500 mg IV q12 per MD F/u renal function, WBC, temp, culture data  Height: 5\' 6"  (167.6 cm) Weight: 67.6 kg (149 lb) IBW/kg (Calculated) : 63.8  Temp (24hrs), Avg:99.1 F (37.3 C), Min:98.7 F (37.1 C), Max:99.7 F (37.6 C)  Recent Labs  Lab 03/10/23 1426  WBC 12.4*  CREATININE 1.19    Estimated Creatinine Clearance: 64 mL/min (by C-G formula based on SCr of 1.19 mg/dL).    Allergies  Allergen Reactions   Bee Venom Hives   Metformin And Related Diarrhea   Antimicrobials this admission:  11/9 Vanc 1500 x 1 11/9 Zosyn x 1 11/9 flagyl >> 11/9 Cefepime>>  Dose adjustments this admission:   Microbiology results:  11/9 BCx2 :  Thank you for allowing pharmacy to be a part of this patient's care.  Herby Abraham, Pharm.D Use secure chat for questions 03/10/2023 5:56 PM

## 2023-03-10 NOTE — ED Provider Notes (Cosign Needed Addendum)
[place] EM[facility]GENC[facility] DEPARTMENT AT [facility] Provider Note   CSN: [id number] Arrival date & time: 03/10/23  1102     History  Chief Complaint  Patient presents with   Emesis    Jon House is a 52 y.o. 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 EMS provided pt with Zofran enroute for vomiting  He was evaluated by ortho on 02/20/23 where he was recommended to have doxy prescription and MRI. Patient is to follow up following MRI  While waiting for imaging, patient reports a "10 minute" episode of midsternal chest pain that has since resolved.  The history is provided by the patient. The history is limited by a language barrier. A language interpreter was used.  Emesis Associated symptoms: no abdominal pain, no chills, no cough, no diarrhea, no fever and no headaches        Home Medications Prior to Admission medications   Medication Sig Start Date End Date Taking? Authorizing Provider  sulfamethoxazole-trimethoprim (BACTRIM DS) 800-160 MG tablet Take 1 tablet by mouth 2 (two) times daily. 03/09/23   Adonis Huguenin, NP  amoxicillin-clavulanate (AUGMENTIN) 500-125 MG tablet Take 1 tablet by mouth 3 (three) times daily. 09/01/22   Adonis Huguenin, NP  Blood Glucose Monitoring Suppl (TRUE METRIX METER) w/Device KIT Use to check blood sugars up to  3 times daily 08/05/18   Fulp, Cammie, MD  doxycycline (VIBRA-TABS) 100 MG tablet Take 1 tablet (100 mg total) by mouth 2 (two) times daily. 02/20/23   [name], MD  glucose blood (TRUE METRIX BLOOD GLUCOSE TEST) test strip Use as instructed to check blood sugars up to 3 x daily 07/07/19   Rema Fendt, NP  ibuprofen (ADVIL) 200 MG tablet Take 200 mg by mouth every 6 (six) hours as needed for moderate pain.    [provider]  insulin NPH-regular Human (70-30) 100 UNIT/ML injection Inject 10-20 Units  into the skin 2 (two) times daily with a meal.    [provider]  Insulin Pen Needle (B-D UF III MINI PEN NEEDLES) 31G X 5 MM MISC Use as instructed. Inject into the skin once nightly. 07/07/19   Rema Fendt, NP  Omega-3 Fatty Acids (FISH OIL PO) Take 1 capsule by mouth daily.    [provider]  oxyCODONE-acetaminophen (PERCOCET/ROXICET) 5-325 MG tablet Take 1 tablet by mouth every 4 (four) hours as needed. 08/30/22   Adonis Huguenin, NP  oxymetazoline (AFRIN ALL NIGHT NODRIP) 0.05 % nasal spray Place 1 spray into both nostrils at bedtime.    [provider]  TRUEplus Lancets 28G MISC Use to help check blood sugars up to 3 times per day 08/05/18   Fulp, Cammie, MD      Allergies    Bee venom and Metformin and related    Review of Systems   Review of Systems  Constitutional:  Negative for chills, fatigue and fever.  Respiratory:  Negative for cough, chest tightness, shortness of breath and wheezing.   Cardiovascular:  Negative for chest pain and palpitations.  Gastrointestinal:  Positive for vomiting. Negative for abdominal pain, constipation, diarrhea and nausea.  Neurological:  Negative for dizziness, seizures, weakness, light-headedness, numbness and headaches.    Physical Exam Updated Vital Signs BP 107/61   Pulse 97   Temp 99.7 F (37.6 C) (Oral)   Resp 18   Ht 5'  6" (1.676 m)   Wt 67.6 kg   SpO2 97%   BMI 24.05 kg/m  Physical Exam Vitals and nursing note reviewed.  Constitutional:      General: He is not in acute distress.    Appearance: Normal appearance.  HENT:     Head: Normocephalic and atraumatic.  Eyes:     Conjunctiva/sclera: Conjunctivae normal.  Cardiovascular:     Rate and Rhythm: Normal rate.  Pulmonary:     Effort: Pulmonary effort is normal. No respiratory distress.  Skin:    Coloration: Skin is not jaundiced or pale.  Neurological:     Mental Status: He is alert. Mental status is at baseline.     ED Results / Procedures  / Treatments   Labs (all labs ordered are listed, but only abnormal results are displayed) Labs Reviewed  CBC WITH DIFFERENTIAL/PLATELET - Abnormal; Notable for the following components:      Result Value   WBC 12.4 (*)    RBC 3.13 (*)    Hemoglobin 9.4 (*)    HCT 27.6 (*)    Neutro Abs 10.6 (*)    All other components within normal limits  COMPREHENSIVE METABOLIC PANEL - Abnormal; Notable for the following components:   Glucose, Bld 149 (*)    BUN 28 (*)    Calcium 8.5 (*)    Albumin 3.0 (*)    AST 11 (*)    All other components within normal limits  CULTURE, BLOOD (ROUTINE X 2)  CULTURE, BLOOD (ROUTINE X 2)  URINALYSIS, ROUTINE W REFLEX MICROSCOPIC  TROPONIN I (HIGH SENSITIVITY)  TROPONIN I (HIGH SENSITIVITY)    EKG None  Radiology DG Chest 2 View  Result Date: 03/10/2023 CLINICAL DATA:  Chest pain. EXAM: CHEST - 2 VIEW COMPARISON:  None Available. FINDINGS: The heart size and mediastinal contours are within normal limits. There is mild bibasilar atelectasis/airspace disease. No pleural effusion or pneumothorax. The visualized skeletal structures are unremarkable. IMPRESSION: Mild bibasilar atelectasis/airspace disease. Electronically Signed   By: Romona Curls M.D.   On: 03/10/2023 14:53   DG Foot Complete Left  Result Date: 03/10/2023 CLINICAL DATA:  Foot pain, concern for osteomyelitis. EXAM: LEFT FOOT - COMPLETE 3+ VIEW COMPARISON:  Foot radiograph dated 02/20/2023. FINDINGS: The patient is status post foot amputation at the level of the metatarsals. No acute fracture is identified. There is osseous demineralization of the fifth metatarsal with surrounding soft tissue swelling, increased since 02/20/2023. IMPRESSION: Osseous demineralization of the fifth metatarsal with surrounding soft tissue swelling, concerning for osteomyelitis. Electronically Signed   By: Romona Curls M.D.   On: 03/10/2023 14:50    Procedures Procedures    Medications Ordered in ED Medications   piperacillin-tazobactam (ZOSYN) IVPB 3.375 g (has no administration in time range)  vancomycin (VANCOREADY) IVPB 1500 mg/300 mL (has no administration in time range)    ED Course/ Medical Decision Making/ A&P                                 Medical Decision Making Amount and/or Complexity of Data Reviewed Labs: ordered. Radiology: ordered.  Risk Prescription drug management.   Patient presents to the ED for concern of "foot odor" and vomiting, this involves an extensive number of treatment options, and is a complaint that carries with it a high risk of complications and morbidity.  The differential diagnosis includes worsening osteomyelitis, cellulitis. This is not an exhaustive list  Co morbidities that complicate the patient evaluation  Diabetes   Additional history obtained:  Additional history obtained from  Nursing, Outside Medical Records, and Past Admission   External records from outside source obtained and reviewed including orthopedics visit from 02/20/2023 where x-ray was significant for osteomyelitis.  He was recommended to have prescription for doxycycline with MRI of foot pending   Lab Tests:  I Ordered, and personally interpreted labs.  The pertinent results include:  leukocytosis of 12.4 with neutrophilia of 10.6. neg troponin x 1 Blood cultures and UA pending   Imaging Studies ordered:  I ordered imaging studies including XR  I independently visualized and interpreted imaging which showed osseous demineralization of the fifth metatarsal with surrounding soft tissue swelling, increased since 02/20/2023 I agree with the radiologist interpretation   Cardiac Monitoring:  The patient was maintained on a cardiac monitor.  I personally viewed and interpreted the cardiac monitored which showed an underlying rhythm of: NSR with no STE nor ischemia   Medicines ordered and prescription drug management:  I ordered medication including vanc and zosyn for  osteomyelitis and failed outpatient antibiotic  Reevaluation of the patient after these medicines showed that the patient stayed the same I have reviewed the patients home medicines and have made adjustments as needed   Consultations Obtained:  I requested consultation with the hospitalist for admission,  and discussed lab and imaging findings as well as pertinent plan - they recommend: admission pending location from orthopedic surgery consult   Problem List / ED Course:  Osteomyelitis   Reevaluation:  After the interventions noted above, I reevaluated the patient and found that they have :stayed the same   Dispostion:  Patient is resting comfortably on exam.  Vital signs within normal limits and afebrile.  An obvious odor is noted while walking into room and removal from foot. See HPI  Upon exam, patient's left foot is hot and painful to touch.  There are 3 open wounds with some purulent drainage (see media). As patient has had a course of doxy without improvement, I believe it is in patient's best interest to be admitted for failure of outpatient treatment. XR is significant for worsening osteomyelitis  In regards to chest pain, patient's initial troponin was negative.  EKG was not significant for STE or ischemia.  symptoms have since resolved. I do not believe pain was due to ACS however will obtain an additional troponin.  After consideration of the diagnostic results and the patients response to treatment, I feel that the patent would benefit from admission. Hospitalist consult and orthopedic surgery consult pending at sign out to Amjad PA.         Final Clinical Impression(s) / ED Diagnoses Final diagnoses:  Osteomyelitis of left foot, unspecified type [facility])    Rx / DC Orders ED Discharge Orders     None         Judithann Sheen, PA 03/10/23 1623    Judithann Sheen, PA 03/10/23 1633    Rondel Baton, MD 03/14/23 [phone]

## 2023-03-11 ENCOUNTER — Inpatient Hospital Stay (HOSPITAL_COMMUNITY): Payer: MEDICAID

## 2023-03-11 DIAGNOSIS — M86272 Subacute osteomyelitis, left ankle and foot: Secondary | ICD-10-CM

## 2023-03-11 DIAGNOSIS — L97509 Non-pressure chronic ulcer of other part of unspecified foot with unspecified severity: Secondary | ICD-10-CM

## 2023-03-11 LAB — HEMOGLOBIN A1C
Hgb A1c MFr Bld: 10.3 % — ABNORMAL HIGH (ref 4.8–5.6)
Mean Plasma Glucose: 248.91 mg/dL

## 2023-03-11 LAB — BASIC METABOLIC PANEL
Anion gap: 10 (ref 5–15)
BUN: 19 mg/dL (ref 6–20)
CO2: 23 mmol/L (ref 22–32)
Calcium: 8.5 mg/dL — ABNORMAL LOW (ref 8.9–10.3)
Chloride: 102 mmol/L (ref 98–111)
Creatinine, Ser: 0.78 mg/dL (ref 0.61–1.24)
GFR, Estimated: >60 mL/min (ref >60)
Glucose, Bld: 228 mg/dL — ABNORMAL HIGH (ref 70–99)
Potassium: 3.8 mmol/L (ref 3.5–5.1)
Sodium: 135 mmol/L (ref 135–145)

## 2023-03-11 LAB — IRON AND TIBC
Iron: 20 ug/dL — ABNORMAL LOW (ref 45–182)
Saturation Ratios: 10 % — ABNORMAL LOW (ref 17.9–39.5)
TIBC: 206 ug/dL — ABNORMAL LOW (ref 250–450)
UIBC: 186 ug/dL

## 2023-03-11 LAB — CBC
HCT: 29.4 % — ABNORMAL LOW (ref 39.0–52.0)
Hemoglobin: 9.7 g/dL — ABNORMAL LOW (ref 13.0–17.0)
MCH: 28.7 pg (ref 26.0–34.0)
MCHC: 33 g/dL (ref 30.0–36.0)
MCV: 87 fL (ref 80.0–100.0)
Platelets: 313 10*3/uL (ref 150–400)
RBC: 3.38 MIL/uL — ABNORMAL LOW (ref 4.22–5.81)
RDW: 12.4 % (ref 11.5–15.5)
WBC: 7.7 10*3/uL (ref 4.0–10.5)
nRBC: 0 % (ref 0.0–0.2)

## 2023-03-11 LAB — RETICULOCYTES
Immature Retic Fract: 9.3 % (ref 2.3–15.9)
RBC.: 3.38 MIL/uL — ABNORMAL LOW (ref 4.22–5.81)
Retic Count, Absolute: 36.8 10*3/uL (ref 19.0–186.0)
Retic Ct Pct: 1.1 % (ref 0.4–3.1)

## 2023-03-11 LAB — GLUCOSE, CAPILLARY
Glucose-Capillary: 212 mg/dL — ABNORMAL HIGH (ref 70–99)
Glucose-Capillary: 165 mg/dL — ABNORMAL HIGH (ref 70–99)
Glucose-Capillary: 195 mg/dL — ABNORMAL HIGH (ref 70–99)
Glucose-Capillary: 260 mg/dL — ABNORMAL HIGH (ref 70–99)

## 2023-03-11 LAB — SURGICAL PCR SCREEN
MRSA, PCR: NEGATIVE
Staphylococcus aureus: POSITIVE — AB

## 2023-03-11 LAB — HIV ANTIBODY (ROUTINE TESTING W REFLEX): HIV Screen 4th Generation wRfx: NONREACTIVE

## 2023-03-11 LAB — FOLATE: Folate: 15.8 ng/mL (ref >5.9)

## 2023-03-11 LAB — VITAMIN B12: Vitamin B-12: 1871 pg/mL — ABNORMAL HIGH (ref 180–914)

## 2023-03-11 LAB — PREALBUMIN: Prealbumin: <5 mg/dL — ABNORMAL LOW (ref 18–38)

## 2023-03-11 LAB — C-REACTIVE PROTEIN: CRP: 25.8 mg/dL — ABNORMAL HIGH (ref <1.0)

## 2023-03-11 LAB — SEDIMENTATION RATE: Sed Rate: 130 mm/h — ABNORMAL HIGH (ref 0–16)

## 2023-03-11 LAB — FERRITIN: Ferritin: 311 ng/mL (ref 24–336)

## 2023-03-11 MED ORDER — VANCOMYCIN HCL IN DEXTROSE 1-5 GM/200ML-% IV SOLN
1000.0000 mg | Freq: Two times a day (BID) | INTRAVENOUS | Status: DC
  Administered 2023-03-11 – 2023-03-15 (×8): 1000 mg via INTRAVENOUS
  Filled 2023-03-11 (×9): qty 200

## 2023-03-11 NOTE — Progress Notes (Signed)
ABI has been completed.   Results can be found under chart review under CV PROC. 03/11/2023 4:40 PM Izela Altier RVT, RDMS

## 2023-03-11 NOTE — Progress Notes (Signed)
PROGRESS NOTE    Jon House  RUE:[id number] DOB: Jul 29, 1970 DOA: 03/10/2023 PCP: Patient, No Pcp Per    Brief Narrative:   Jon House is a 52 y.o. 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 Patient reports that the left foot has continued to worsen with increasing pain, drainage, and odor despite just completing a course of doxycycline. He has developed fever and general malaise with nausea and nonbloody vomiting.   In the ED, temperature 98.7 F, HR 107, RR 20, BP 99/59, SpO2 95% on room air.  WBC 12.4, hemoglobin 9.4, platelet count 303.  Sodium 135, potassium 3.8, chloride 102, CO2 25, glucose 149, BUN 28, creat 1.19.  AST 11, ALT 14, total bilirubin 0.4.  High sensitive troponin 9 followed by 7.  Urinalysis with greater than 500 glucose, otherwise unrevealing.  MRSA PCR negative.  MSSA PCR positive.  Chest x-ray with mild bibasilar atelectasis/airspace disease.  Left foot x-ray with osseous demineralization of the fifth metatarsal with surrounding soft tissue swelling, concerning for osteomyelitis.  Assessment & Plan:    Acute left foot osteomyelitis Patient presenting to the ED with progressive left foot wound with foul odor, drainage associate with fever, nausea/vomiting over the last several days.  Previous history of diabetic foot infections and osteomyelitis s/p TMA.  On arrival, patient has been afebrile with elevated WBC count of 12.4. -- Orthopedics following, appreciate assistance -- MRI left foot: Pending -- Tylenol 650 mg p.o. every 6 hours as needed mild pain -- Oxycodone 5 mg p.o. every 4 hours.  Moderate pain -- Dilaudid 0.5 mg IV every 4 hours as needed severe pain -- Vancomycin, pharmacy consulted for  dosing/monitoring -- Cefepime 2 g IV every 8 hours -- Metronidazole 500 mg IV every 12 hours -- Await further surgical recommendations, anticipate need of operative management with suspected diabetic foot wound with likely abscess/osteomyelitis -- CBC daily  Type 2 diabetes mellitus, with hyperglycemia Hemoglobin A1c 10.3, poorly controlled.  Home regimen includes NPH 70-3010-20 units twice daily -- Diabetic educator consult -- Semglee 15 units  daily -- SSI for coverage -- CBGs qAC/HS  DVT prophylaxis: heparin injection 5,000 Units Start: 03/10/23 1700    Code Status: Full Code Family Communication: No family present at bedside this morning  Disposition Plan:  Level of care: Med-Surg Status is: Inpatient Remains inpatient appropriate because: Anticipate need of surgical intervention for diabetic foot wound with osteomyelitis, IV antibiotics    Consultants:  Orthopedics, Dr. Magnus Ivan  Procedures:  None  Antimicrobials:  Vancomycin Cefepime Metronidazole   Subjective: Patient seen examined bedside, resting calmly.  Lying in bed.  Seen by orthopedics this morning, awaiting MR results.  Continues on IV antibiotics.  Currently reports pain controlled.  No further fevers.  No other questions or concerns at this time.  Denies headache, no dizziness, no chest pain, no palpitations, no shortness of breath, no abdominal pain, no fever/chills/night sweats, no nausea/vomiting/diarrhea, no focal weakness, no fatigue, no paresthesia.  New acute events overnight per nursing.  Objective: Vitals:   03/10/23 1634 03/10/23 2016 03/11/23 0318 03/11/23 0913  BP: 111/63 103/61 119/64 124/70  Pulse: 93 92 93 86  Resp: 18 17 18 17   Temp: 99 F (37.2 C) 98.5 F (36.9 C) 98.7 F (37.1 C) 98.2 F (36.8 C)  TempSrc: Oral  Oral   SpO2: 98% 98% 99% 99%  Weight:      Height:        Intake/Output Summary (Last 24 hours) at 03/11/2023 1001 Last data filed at 03/11/2023 0400 Gross per 24  hour  Intake 344.22 ml  Output 600 ml  Net -255.78 ml   Filed Weights   03/10/23 1116  Weight: 67.6 kg    Examination:  Physical Exam: GEN: NAD, alert and oriented x 3, wd/wn HEENT: NCAT, PERRL, EOMI, sclera clear, MMM PULM: CTAB w/o wheezes/crackles, normal respiratory effort, on room air CV: RRR w/o M/G/R GI: abd soft, NTND, NABS, no R/G/M MSK: + edema left foot/ankle, muscle strength globally intact 5/5 bilateral upper/lower extremities NEURO: CN II-XII intact, no focal deficits, sensation to light touch intact PSYCH: normal mood/affect Integumentary: left foot stump noted with ulcerations, foul-smelling with purulent discharge from wound site       Data Reviewed: I have personally reviewed following labs and imaging studies  CBC: Recent Labs  Lab 03/10/23 1426 03/11/23 0602  WBC 12.4* 7.7  NEUTROABS 10.6*  --   HGB 9.4* 9.7*  HCT 27.6* 29.4*  MCV 88.2 87.0  PLT 303 313   Basic Metabolic Panel: Recent Labs  Lab 03/10/23 1426 03/11/23 0602  NA 135 135  K 3.8 3.8  CL 102 102  CO2 25 23  GLUCOSE 149* 228*  BUN 28* 19  CREATININE 1.19 0.78  CALCIUM 8.5* 8.5*   GFR: Estimated Creatinine Clearance: 95.3 mL/min (by C-G formula based on SCr of 0.78 mg/dL). Liver Function Tests: Recent Labs  Lab 03/10/23 1426  AST 11*  ALT 14  ALKPHOS 81  BILITOT 0.4  PROT 7.6  ALBUMIN 3.0*   No results for input(s): "LIPASE", "AMYLASE" in the last 168 hours. No results for input(s): "AMMONIA" in the last 168 hours. Coagulation Profile: No results for input(s): "INR", "PROTIME" in the last 168 hours. Cardiac Enzymes: No results for input(s): "CKTOTAL", "CKMB", "CKMBINDEX", "TROPONINI" in the last 168 hours. BNP (last 3 results) No results for input(s): "PROBNP" in the last 8760 hours. HbA1C: Recent Labs    03/11/23 0602  HGBA1C 10.3*   CBG: Recent Labs  Lab 03/10/23 1733 03/10/23 2228 03/11/23 0914  GLUCAP 151* 205* 212*   Lipid Profile: No results  for input(s): "CHOL", "HDL", "LDLCALC", "TRIG", "CHOLHDL", "LDLDIRECT" in the last 72 hours. Thyroid Function Tests: No results for input(s): "TSH", "T4TOTAL", "FREET4", "T3FREE", "THYROIDAB" in the last 72 hours. Anemia Panel: Recent Labs    03/11/23 0602  VITAMINB12 1,871*  FOLATE 15.8  FERRITIN 311  TIBC 206*  IRON 20*  RETICCTPCT 1.1   Sepsis Labs: No results for input(s): "PROCALCITON", "LATICACIDVEN" in the last 168 hours.  Recent Results (from the past 240 hour(s))  Blood culture (routine x 2)     Status: None (Preliminary result)   Collection Time: 03/10/23  4:03 PM   Specimen: BLOOD  Result Value Ref Range Status   Specimen Description   Final    BLOOD LEFT ANTECUBITAL Performed at [facility], [address] [place], [place] [address]    Special Requests   Final    BOTTLES DRAWN AEROBIC AND ANAEROBIC Blood Culture adequate volume Performed at [facility], [address], [place], [place] [address]    Culture   Final    NO GROWTH < 24 HOURS Performed at [facility], [address], [place], [place] [address]    Report  Status PENDING  Incomplete  Blood culture (routine x 2)     Status: None (Preliminary result)   Collection Time: 03/10/23  4:05 PM   Specimen: BLOOD  Result Value Ref Range Status   Specimen Description   Final    BLOOD RIGHT ANTECUBITAL Performed at [facility], [address], [place], [place] [address]    Special Requests   Final    BOTTLES DRAWN AEROBIC AND ANAEROBIC Blood Culture adequate volume Performed at [facility], [address], [place], [place] [address]    Culture   Final    NO GROWTH < 24 HOURS Performed at [facility], [address], [place], [place] [address]    Report Status PENDING  Incomplete  Surgical PCR screen     Status: Abnormal   Collection Time: 03/11/23 12:33 AM   Specimen: Nasal Mucosa; Nasal Swab  Result Value Ref  Range Status   MRSA, PCR NEGATIVE NEGATIVE Final   Staphylococcus aureus POSITIVE (A) NEGATIVE Final    Comment: (NOTE) The Xpert SA Assay (FDA approved for NASAL specimens in patients 48 years of age and older), is one component of a comprehensive surveillance program. It is not intended to diagnose infection nor to guide or monitor treatment. Performed at [facility], [address] [place], [place] [address]          Radiology Studies: DG Chest 2 View  Result Date: 03/10/2023 CLINICAL DATA:  Chest pain. EXAM: CHEST - 2 VIEW COMPARISON:  None Available. FINDINGS: The heart size and mediastinal contours are within normal limits. There is mild bibasilar atelectasis/airspace disease. No pleural effusion or pneumothorax. The visualized skeletal structures are unremarkable. IMPRESSION: Mild bibasilar atelectasis/airspace disease. Electronically Signed   By: Romona Curls M.D.   On: 03/10/2023 14:53   DG Foot Complete Left  Result Date: 03/10/2023 CLINICAL DATA:  Foot pain, concern for osteomyelitis. EXAM: LEFT FOOT - COMPLETE 3+ VIEW COMPARISON:  Foot radiograph dated 02/20/2023. FINDINGS: The patient is status post foot amputation at the level of the metatarsals. No acute fracture is identified. There is osseous demineralization of the fifth metatarsal with surrounding soft tissue swelling, increased since 02/20/2023. IMPRESSION: Osseous demineralization of the fifth metatarsal with surrounding soft tissue swelling, concerning for osteomyelitis. Electronically Signed   By: Romona Curls M.D.   On: 03/10/2023 14:50        Scheduled Meds:  heparin  5,000 Units Subcutaneous Q8H   influenza vac split trivalent PF  0.5 mL Intramuscular Tomorrow-1000   insulin aspart  0-5 Units Subcutaneous QHS   insulin aspart  0-9 Units Subcutaneous TID WC   insulin glargine-yfgn  15 Units Subcutaneous Daily   metroNIDAZOLE  500 mg Oral Q12H   mupirocin ointment  1 Application Nasal BID    pneumococcal 20-valent conjugate vaccine  0.5 mL Intramuscular Tomorrow-1000   Continuous Infusions:  sodium chloride 10 mL/hr at 03/10/23 2340   ceFEPime (MAXIPIME) IV 2 g (03/11/23 0634)   vancomycin       LOS: 1 day    Time spent: 52 minutes spent on chart review, discussion with nursing staff, consultants, updating family and interview/physical exam; more than 50% of that time was spent in counseling and/or coordination of care.    Alvira Philips [place], DO [facility] Available via Epic secure chat 7am-7pm After these hours, please refer to coverage provider listed on [email or web address] 03/11/2023, 10:01 AM

## 2023-03-11 NOTE — Plan of Care (Signed)
  Problem: Education: Goal: Ability to describe self-care measures that may prevent or decrease complications (Diabetes Survival Skills Education) will improve Outcome: Progressing   Problem: Health Behavior/Discharge Planning: Goal: Ability to manage health-related needs will improve Outcome: Progressing   Problem: Metabolic: Goal: Ability to maintain appropriate glucose levels will improve Outcome: Progressing   Problem: Nutritional: Goal: Maintenance of adequate nutrition will improve Outcome: Progressing   Problem: Skin Integrity: Goal: Risk for impaired skin integrity will decrease Outcome: Progressing   Problem: Education: Goal: Knowledge of General Education information will improve Description: Including pain rating scale, medication(s)/side effects and non-pharmacologic comfort measures Outcome: Progressing

## 2023-03-11 NOTE — Progress Notes (Signed)
Pharmacy Antibiotic Note  Jon House is a 52 y.o. male admitted on 03/10/2023 with left foot infection/osteomyelitis.  Pharmacy has been consulted for vancomycin & cefepime dosing.  Serum creatinine improved. Adjust vanc dosing  Vancomycin 1000 mg IV Q 12 hrs. Goal AUC 400-550. Expected AUC: 480 SCr used: 0.78   Plan: Increase Vancomycin 1000 mg IV q12hr F/u renal function, WBC, temp, culture data  Height: 5\' 6"  (167.6 cm) Weight: 67.6 kg (149 lb) IBW/kg (Calculated) : 63.8  Temp (24hrs), Avg:98.9 F (37.2 C), Min:98.5 F (36.9 C), Max:99.7 F (37.6 C)  Recent Labs  Lab 03/10/23 1426 03/11/23 0602  WBC 12.4* 7.7  CREATININE 1.19 0.78    Estimated Creatinine Clearance: 95.3 mL/min (by C-G formula based on SCr of 0.78 mg/dL).    Allergies  Allergen Reactions   Bee Venom Hives   Metformin And Related Diarrhea   Antimicrobials this admission:  11/9 Vanc 1500 x 1 11/10 Vanc 750 x 1, then 1000 q12hr >> 11/9 Zosyn x 1 11/9 flagyl >> 11/9 Cefepime>>  Dose adjustments this admission:   Microbiology results:  11/9 BCx2 :  Thank you for allowing pharmacy to be a part of this patient's care.  Jeanella Cara, PharmD, [facility] Clinical Pharmacist Please see AMION for all Pharmacists' Contact Phone Numbers 03/11/2023, 8:27 AM

## 2023-03-11 NOTE — Consult Note (Signed)
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 male.  HPI: The patient is a 52 year old gentleman who has a history of a left foot transmetatarsal amputation by my partner Dr. Lajoyce Corners back in April of this year.  He had been seen in our office just a few weeks ago and there was some breakdown and new ulcers noted with his left foot.  Dr. Lajoyce Corners had recommended a MRI for the foot to help assess the degree of infection that may be involved in the bone as well as to determine the potential for further surgery in terms of something around the same level versus a below-knee amputation.  The patient does speak some broken [place].  He was admitted graciously to the medicine service and started on IV antibiotics.  He presented to the emergency room yesterday due to nausea and vomiting and was again admitted graciously to the medicine service.  I was able to review all of his notes within epic as well as his labs.  Unfortunately he is a poorly controlled diabetic.  His current hemoglobin A1c is 10.3.  Both his sed rate and CRP are significantly elevated.  His white blood cell count was elevated on admission and is back down to normal today.  He has been n.p.o. since midnight.  Past Medical History:  Diagnosis Date   Diabetes mellitus without complication (HCC)    Type II   Infection 10/04/2018   LEFT FOOT   Osteomyelitis of great toe of left foot (HCC) 07/29/2018   Subacute osteomyelitis, left ankle and foot [facility])     Past Surgical History:  Procedure Laterality Date   AMPUTATION Left 07/31/2018   Procedure: LEFT GREAT TOE AMPUTATION;  Surgeon: [name], MD;  Location: [facility] OR;  Service: Orthopedics;  Laterality: Left;   AMPUTATION Left 10/09/2018   Procedure: LEFT FOOT 2ND TOE AND POSSIBLE 3RD TOE AMPUTATION;  Surgeon: [name], MD;  Location: [facility] OR;  Service: Orthopedics;  Laterality: Left;    AMPUTATION Right 03/17/2020   Procedure: RIGHT GREAT TOE AMPUTATION THROUGH METATARSAL;  Surgeon: [name], MD;  Location: [facility] OR;  Service: Orthopedics;  Laterality: Right;   AMPUTATION Left 10/22/2020   Procedure: LEFT TRANSMETATARSAL AMPUTATION;  Surgeon: [name], MD;  Location: [facility] OR;  Service: Orthopedics;  Laterality: Left;   APPLICATION OF WOUND VAC Right 03/17/2020   Procedure: APPLICATION OF WOUND VAC;  Surgeon: [name], MD;  Location: [facility] OR;  Service: Orthopedics;  Laterality: Right;   I & D EXTREMITY Right 01/30/2020   Procedure: DEBRIDEMENT ABSCESS RIGHT FOOT;  Surgeon: [name], MD;  Location: [facility] OR;  Service: Orthopedics;  Laterality: Right;   I & D EXTREMITY Right 04/07/2020   Procedure: PARTIAL TARSAL EXCISION RIGHT FOOT;  Surgeon: [name], MD;  Location[facility];  Service: Orthopedics;  Laterality: Right;   STUMP REVISION Left 08/16/2022   Procedure: REVISION LEFT TRANSMETATARSAL AMPUTATION;  Surgeon: [name], MD;  Location: [facility] OR;  Service: Orthopedics;  Laterality: Left;    Family History  Problem Relation Age of Onset   Diabetes Mother     Social History:  reports that he quit smoking about 17 years ago. His smoking use included cigarettes. He started smoking about 44 years ago. He has never used smokeless tobacco. He reports current alcohol use of about 6.0 - 8.0 standard drinks of alcohol per week.  He reports that he does not use drugs.  Allergies:  Allergies  Allergen Reactions   Bee Venom Hives   Metformin And Related Diarrhea    Medications: I have reviewed the patient's current medications.  Results for orders placed or performed during the hospital encounter of 03/10/23 (from the past 48 hour(s))  CBC with Differential     Status: Abnormal   Collection Time: 03/10/23  2:26 PM  Result Value Ref Range   WBC 12.4 (H) 4.0 - 10.5 K/uL   RBC 3.13 (L) 4.22 - 5.81 MIL/uL   Hemoglobin 9.4 (L) 13.0 - 17.0 g/dL   HCT 36.6 (L)  44.0 - 52.0 %   MCV 88.2 80.0 - 100.0 fL   MCH 30.0 26.0 - 34.0 pg   MCHC 34.1 30.0 - 36.0 g/dL   RDW 34.7 42.5 - 95.6 %   Platelets 303 150 - 400 K/uL   nRBC 0.0 0.0 - 0.2 %   Neutrophils Relative % 85 %   Neutro Abs 10.6 (H) 1.7 - 7.7 K/uL   Lymphocytes Relative 6 %   Lymphs Abs 0.7 0.7 - 4.0 K/uL   Monocytes Relative 8 %   Monocytes Absolute 1.0 0.1 - 1.0 K/uL   Eosinophils Relative 0 %   Eosinophils Absolute 0.0 0.0 - 0.5 K/uL   Basophils Relative 0 %   Basophils Absolute 0.0 0.0 - 0.1 K/uL   Immature Granulocytes 1 %   Abs Immature Granulocytes 0.06 0.00 - 0.07 K/uL    Comment: Performed at [facility], [address] [place], [place] [address]  Comprehensive metabolic panel     Status: Abnormal   Collection Time: 03/10/23  2:26 PM  Result Value Ref Range   Sodium 135 135 - 145 mmol/L   Potassium 3.8 3.5 - 5.1 mmol/L   Chloride 102 98 - 111 mmol/L   CO2 25 22 - 32 mmol/L   Glucose, Bld 149 (H) 70 - 99 mg/dL    Comment: Glucose reference range applies only to samples taken after fasting for at least 8 hours.   BUN 28 (H) 6 - 20 mg/dL   Creatinine, Ser 4.33 0.61 - 1.24 mg/dL   Calcium 8.5 (L) 8.9 - 10.3 mg/dL   Total Protein 7.6 6.5 - 8.1 g/dL   Albumin 3.0 (L) 3.5 - 5.0 g/dL   AST 11 (L) 15 - 41 U/L   ALT 14 0 - 44 U/L   Alkaline Phosphatase 81 38 - 126 U/L   Total Bilirubin 0.4 <1.2 mg/dL   GFR, Estimated >29 >51 mL/min    Comment: (NOTE) Calculated using the CKD-EPI Creatinine Equation (2021)    Anion gap 8 5 - 15    Comment: Performed at [facility], [address], [place], [place] [address]  Troponin I (High Sensitivity)     Status: None   Collection Time: 03/10/23  2:26 PM  Result Value Ref Range   Troponin I (High Sensitivity) 9 <18 ng/L    Comment: (NOTE) Elevated high sensitivity troponin I (hsTnI) values and significant  changes across serial measurements may suggest ACS but many other  chronic and acute  conditions are known to elevate hsTnI results.  Refer to the "Links" section for chest pain algorithms and additional  guidance. Performed at [facility], [address], [place], [place] [address]   Troponin I (High Sensitivity)     Status: None   Collection Time: 03/10/23  4:03 PM  Result Value  Ref Range   Troponin I (High Sensitivity) 7 <18 ng/L    Comment: (NOTE) Elevated high sensitivity troponin I (hsTnI) values and significant  changes across serial measurements may suggest ACS but many other  chronic and acute conditions are known to elevate hsTnI results.  Refer to the "Links" section for chest pain algorithms and additional  guidance. Performed at [facility], [address], [place], [place] [address]   Blood culture (routine x 2)     Status: None (Preliminary result)   Collection Time: 03/10/23  4:03 PM   Specimen: BLOOD  Result Value Ref Range   Specimen Description      BLOOD LEFT ANTECUBITAL Performed at [facility], [address], [place], [place] [address]    Special Requests      BOTTLES DRAWN AEROBIC AND ANAEROBIC Blood Culture adequate volume Performed at [facility], [address], [place], [place] [address]    Culture      NO GROWTH < 24 HOURS Performed at [facility], [address] [place], [place] [address]    Report Status PENDING   Blood culture (routine x 2)     Status: None (Preliminary result)   Collection Time: 03/10/23  4:05 PM   Specimen: BLOOD  Result Value Ref Range   Specimen Description      BLOOD RIGHT ANTECUBITAL Performed at [facility], [address] [place], [place] [address]    Special Requests      BOTTLES DRAWN AEROBIC AND ANAEROBIC Blood Culture adequate volume Performed at [facility], [address], [place], [place] [address]    Culture      NO GROWTH < 24 HOURS Performed at [facility], [address] [place], [place] [address]    Report Status PENDING   Urinalysis, Routine w reflex microscopic -Urine, Clean Catch     Status: Abnormal   Collection Time: 03/10/23  4:05 PM  Result Value Ref Range   Color, Urine YELLOW YELLOW   APPearance HAZY (A) CLEAR   Specific Gravity, Urine 1.020 1.005 - 1.030   pH 6.0 5.0 - 8.0   Glucose, UA >=500 (A) NEGATIVE mg/dL   Hgb urine dipstick NEGATIVE NEGATIVE   Bilirubin Urine NEGATIVE NEGATIVE   Ketones, ur NEGATIVE NEGATIVE mg/dL   Protein, ur 30 (A) NEGATIVE mg/dL   Nitrite NEGATIVE NEGATIVE   Leukocytes,Ua NEGATIVE NEGATIVE   RBC / HPF 0-5 0 - 5 RBC/hpf   WBC, UA 0-5 0 - 5 WBC/hpf   Bacteria, UA RARE (A) NONE SEEN   Squamous Epithelial / HPF 0-5 0 - 5 /HPF   Mucus PRESENT     Comment: Performed at [facility], [address] [place], [place] [address]  CBG monitoring, ED     Status: Abnormal   Collection Time: 03/10/23  5:33 PM  Result Value Ref Range   Glucose-Capillary 151 (H) 70 - 99 mg/dL    Comment: Glucose reference range applies only to samples taken after fasting for at least 8 hours.  Glucose, capillary     Status: Abnormal   Collection Time: 03/10/23 10:28 PM  Result Value Ref Range   Glucose-Capillary 205 (H) 70 - 99 mg/dL    Comment: Glucose reference range applies only to samples taken after fasting for at least 8 hours.  Surgical PCR screen     Status: Abnormal   Collection Time: 03/11/23 12:33 AM  Specimen: Nasal Mucosa; Nasal Swab  Result Value Ref Range   MRSA, PCR NEGATIVE NEGATIVE   Staphylococcus aureus POSITIVE (A) NEGATIVE    Comment: (NOTE) The Xpert SA Assay (FDA approved for NASAL specimens in patients 54 years of age and older), is one component of a comprehensive surveillance program. It is not intended to diagnose infection nor to guide or monitor treatment. Performed at [facility], [address], [place], [place] [address]   Hemoglobin A1c      Status: Abnormal   Collection Time: 03/11/23  6:02 AM  Result Value Ref Range   Hgb A1c MFr Bld 10.3 (H) 4.8 - 5.6 %    Comment: (NOTE) Pre diabetes:          5.7%-6.4%  Diabetes:              >6.4%  Glycemic control for   <7.0% adults with diabetes    Mean Plasma Glucose 248.91 mg/dL    Comment: Performed at [facility], [address] [place], [place] [address]  Sedimentation rate     Status: Abnormal   Collection Time: 03/11/23  6:02 AM  Result Value Ref Range   Sed Rate 130 (H) 0 - 16 mm/hr    Comment: Performed at [facility], [address], [place], [place] [address]  C-reactive protein     Status: Abnormal   Collection Time: 03/11/23  6:02 AM  Result Value Ref Range   CRP 25.8 (H) <1.0 mg/dL    Comment: Performed at [facility], [address], [place], [place] [address]  Prealbumin     Status: Abnormal   Collection Time: 03/11/23  6:02 AM  Result Value Ref Range   Prealbumin <5 (L) 18 - 38 mg/dL    Comment: Performed at [facility], [address] [place], [place] [address]  HIV Antibody (routine testing w rflx)     Status: None   Collection Time: 03/11/23  6:02 AM  Result Value Ref Range   HIV Screen 4th Generation wRfx Non Reactive Non Reactive    Comment: Performed at [facility], [address] [place], [place] [address]  Basic metabolic panel     Status: Abnormal   Collection Time: 03/11/23  6:02 AM  Result Value Ref Range   Sodium 135 135 - 145 mmol/L   Potassium 3.8 3.5 - 5.1 mmol/L   Chloride 102 98 - 111 mmol/L   CO2 23 22 - 32 mmol/L   Glucose, Bld 228 (H) 70 - 99 mg/dL    Comment: Glucose reference range applies only to samples taken after fasting for at least 8 hours.   BUN 19 6 - 20 mg/dL   Creatinine, Ser 5.78 0.61 - 1.24 mg/dL   Calcium 8.5 (L) 8.9 - 10.3 mg/dL   GFR, Estimated >46 >96 mL/min    Comment: (NOTE) Calculated using the CKD-EPI Creatinine Equation (2021)    Anion gap 10 5 - 15    Comment:  Performed at [facility], [address], [place], [place] [address]  CBC     Status: Abnormal   Collection Time: 03/11/23  6:02 AM  Result Value Ref Range   WBC 7.7 4.0 - 10.5 K/uL   RBC 3.38 (L) 4.22 - 5.81 MIL/uL   Hemoglobin 9.7 (L) 13.0 - 17.0 g/dL   HCT 41.3 (L) 24.4 - 01.0 %   MCV 87.0 80.0 - 100.0 fL  MCH 28.7 26.0 - 34.0 pg   MCHC 33.0 30.0 - 36.0 g/dL   RDW 13.2 44.0 - 10.2 %   Platelets 313 150 - 400 K/uL   nRBC 0.0 0.0 - 0.2 %    Comment: Performed at [facility], [address] [place], [place] [address]  Vitamin B12     Status: Abnormal   Collection Time: 03/11/23  6:02 AM  Result Value Ref Range   Vitamin B-12 1,871 (H) 180 - 914 pg/mL    Comment: (NOTE) This assay is not validated for testing neonatal or myeloproliferative syndrome specimens for Vitamin B12 levels. Performed at [facility], [address], [place], [place] [address]   Folate     Status: None   Collection Time: 03/11/23  6:02 AM  Result Value Ref Range   Folate 15.8 >5.9 ng/mL    Comment: Performed at [facility], [address], [place], [place] [address]  Iron and TIBC     Status: Abnormal   Collection Time: 03/11/23  6:02 AM  Result Value Ref Range   Iron 20 (L) 45 - 182 ug/dL   TIBC 956 (L) 387 - 564 ug/dL   Saturation Ratios 10 (L) 17.9 - 39.5 %   UIBC 186 ug/dL    Comment: Performed at [facility], [address], [place], [place] [address]  Ferritin     Status: None   Collection Time: 03/11/23  6:02 AM  Result Value Ref Range   Ferritin 311 24 - 336 ng/mL    Comment: Performed at [facility], [address], [place], [place] [address]  Reticulocytes     Status: Abnormal   Collection Time: 03/11/23  6:02 AM  Result Value Ref Range   Retic Ct Pct 1.1 0.4 - 3.1 %   RBC. 3.38 (L) 4.22 - 5.81 MIL/uL   Retic Count, Absolute 36.8 19.0 - 186.0 K/uL   Immature Retic Fract 9.3 2.3 - 15.9 %    Comment: Performed at [facility], [address], [place], [place] [address]    DG Chest 2 View  Result Date: 03/10/2023 CLINICAL DATA:  Chest pain. EXAM: CHEST - 2 VIEW COMPARISON:  None Available. FINDINGS: The heart size and mediastinal contours are within normal limits. There is mild bibasilar atelectasis/airspace disease. No pleural effusion or pneumothorax. The visualized skeletal structures are unremarkable. IMPRESSION: Mild bibasilar atelectasis/airspace disease. Electronically Signed   By: Romona Curls M.D.   On: 03/10/2023 14:53   DG Foot Complete Left  Result Date: 03/10/2023 CLINICAL DATA:  Foot pain, concern for osteomyelitis. EXAM: LEFT FOOT - COMPLETE 3+ VIEW COMPARISON:  Foot radiograph dated 02/20/2023. FINDINGS: The patient is status post foot amputation at the level of the metatarsals. No acute fracture is identified. There is osseous demineralization of the fifth metatarsal with surrounding soft tissue swelling, increased since 02/20/2023. IMPRESSION: Osseous demineralization of the fifth metatarsal with surrounding soft tissue swelling, concerning for osteomyelitis. Electronically Signed   By: Romona Curls M.D.   On: 03/10/2023 14:50    Review of Systems Blood pressure 119/64, pulse 93, temperature 98.7 F (37.1 C), temperature source Oral, resp. rate 18, height 5\' 6"  (1.676 m), weight 67.6 kg, SpO2 99%. Physical Exam Vitals reviewed.  Constitutional:      Appearance: Normal appearance. He is normal weight.  HENT:     Head: Normocephalic and atraumatic.  Eyes:     Extraocular Movements: Extraocular movements  intact.     Pupils: Pupils are equal, round, and reactive to light.  Cardiovascular:     Rate and Rhythm: Normal rate.  Pulmonary:     Effort: Pulmonary effort is normal.  Abdominal:     Palpations: Abdomen is soft.  Musculoskeletal:     Cervical back: Normal range of motion.     Left Lower Extremity: Left leg is amputated below ankle.  Neurological:     Mental Status: He is alert and oriented to  person, place, and time.  Psychiatric:        Behavior: Behavior normal.    Examination of the left foot shows a previous transmetatarsal amputation.  There are 2 open wounds with slight drainage and a foul odor.  There was no gross purulence but there is likely deep infection based on clinical exam findings.   Assessment/Plan: Acute to subacute osteomyelitis left foot with a history of a previous transmetatarsal amputation.  I was able to review the MRI of his left foot.  There has not been an official reading on the MRI but I can see edema in the bone of one of the residual metatarsals as well as the midfoot.  This is likely indicative of osteomyelitis and this is more likely the case given his presentation and laboratory findings.  It does not look like he needs an urgent or emergent surgery.  He should continue on IV antibiotics and I will reach out to Dr. Lajoyce Corners and make him aware the patient is here for surgical planning this week.  I will order the patient a diet today.  I have explained this in detail to the patient he does state that he understands.  Kathryne Hitch 03/11/2023, 8:43 AM

## 2023-03-12 ENCOUNTER — Ambulatory Visit: Payer: No Typology Code available for payment source | Admitting: Orthopedic Surgery

## 2023-03-12 DIAGNOSIS — E43 Unspecified severe protein-calorie malnutrition: Secondary | ICD-10-CM | POA: Insufficient documentation

## 2023-03-12 LAB — BASIC METABOLIC PANEL
Anion gap: 9 (ref 5–15)
BUN: 15 mg/dL (ref 6–20)
CO2: 24 mmol/L (ref 22–32)
Calcium: 8.6 mg/dL — ABNORMAL LOW (ref 8.9–10.3)
Chloride: 99 mmol/L (ref 98–111)
Creatinine, Ser: 0.68 mg/dL (ref 0.61–1.24)
GFR, Estimated: >60 mL/min (ref >60)
Glucose, Bld: 262 mg/dL — ABNORMAL HIGH (ref 70–99)
Potassium: 4.2 mmol/L (ref 3.5–5.1)
Sodium: 132 mmol/L — ABNORMAL LOW (ref 135–145)

## 2023-03-12 LAB — CBC
HCT: 31.4 % — ABNORMAL LOW (ref 39.0–52.0)
Hemoglobin: 10.4 g/dL — ABNORMAL LOW (ref 13.0–17.0)
MCH: 28.8 pg (ref 26.0–34.0)
MCHC: 33.1 g/dL (ref 30.0–36.0)
MCV: 87 fL (ref 80.0–100.0)
Platelets: 364 10*3/uL (ref 150–400)
RBC: 3.61 MIL/uL — ABNORMAL LOW (ref 4.22–5.81)
RDW: 12.1 % (ref 11.5–15.5)
WBC: 6 10*3/uL (ref 4.0–10.5)
nRBC: 0 % (ref 0.0–0.2)

## 2023-03-12 LAB — GLUCOSE, CAPILLARY
Glucose-Capillary: 236 mg/dL — ABNORMAL HIGH (ref 70–99)
Glucose-Capillary: 134 mg/dL — ABNORMAL HIGH (ref 70–99)
Glucose-Capillary: 271 mg/dL — ABNORMAL HIGH (ref 70–99)

## 2023-03-12 LAB — VAS US ABI WITH/WO TBI
Left ABI: 1.37
Right ABI: 1.35

## 2023-03-12 MED ORDER — JUVEN PO PACK
1.0000 | PACK | Freq: Two times a day (BID) | ORAL | Status: DC
  Administered 2023-03-12 – 2023-03-13 (×3): 1 via ORAL
  Filled 2023-03-12 (×3): qty 1

## 2023-03-12 MED ORDER — ENSURE ENLIVE PO LIQD
237.0000 mL | Freq: Every day | ORAL | Status: DC
  Administered 2023-03-13 – 2023-03-16 (×2): 237 mL via ORAL

## 2023-03-12 MED ORDER — ADULT MULTIVITAMIN W/MINERALS CH
1.0000 | ORAL_TABLET | Freq: Every day | ORAL | Status: DC
Start: 2023-03-12 — End: 2023-03-16
  Administered 2023-03-12 – 2023-03-16 (×5): 1 via ORAL
  Filled 2023-03-12 (×5): qty 1

## 2023-03-12 MED ORDER — INSULIN GLARGINE-YFGN 100 UNIT/ML ~~LOC~~ SOLN
20.0000 [IU] | Freq: Every day | SUBCUTANEOUS | Status: DC
  Administered 2023-03-12: 20 [IU] via SUBCUTANEOUS
  Filled 2023-03-12 (×2): qty 0.2

## 2023-03-12 MED ORDER — INSULIN ASPART 100 UNIT/ML IJ SOLN
3.0000 [IU] | Freq: Three times a day (TID) | INTRAMUSCULAR | Status: DC
  Administered 2023-03-12 (×3): 3 [IU] via SUBCUTANEOUS

## 2023-03-12 NOTE — Consult Note (Signed)
ORTHOPAEDIC CONSULTATION  REQUESTING PHYSICIAN: Jon House, Eric J, DO  Chief Complaint: Abscess and ulceration left transmetatarsal amputation.  HPI: Jon House is a 52 y.o. male who presents with abscess and ulceration left transmetatarsal amputation.  Patient is 7 months status post revision of his previous transmetatarsal amputation.  Patient reports fever pain and swelling.  Past Medical History:  Diagnosis Date   Diabetes mellitus without complication (HCC)    Type II   Infection 10/04/2018   LEFT FOOT   Osteomyelitis of great toe of left foot (HCC) 07/29/2018   Subacute osteomyelitis, left ankle and foot [facility])    Past Surgical History:  Procedure Laterality Date   AMPUTATION Left 07/31/2018   Procedure: LEFT GREAT TOE AMPUTATION;  Surgeon: [name], MD;  Location: [facility] OR;  Service: Orthopedics;  Laterality: Left;   AMPUTATION Left 10/09/2018   Procedure: LEFT FOOT 2ND TOE AND POSSIBLE 3RD TOE AMPUTATION;  Surgeon: [name], MD;  Location: [facility] OR;  Service: Orthopedics;  Laterality: Left;   AMPUTATION Right 03/17/2020   Procedure: RIGHT GREAT TOE AMPUTATION THROUGH METATARSAL;  Surgeon: [name], MD;  Location: [facility] OR;  Service: Orthopedics;  Laterality: Right;   AMPUTATION Left 10/22/2020   Procedure: LEFT TRANSMETATARSAL AMPUTATION;  Surgeon: [name], MD;  Location: [facility] OR;  Service: Orthopedics;  Laterality: Left;   APPLICATION OF WOUND VAC Right 03/17/2020   Procedure: APPLICATION OF WOUND VAC;  Surgeon: [name], MD;  Location: [facility] OR;  Service: Orthopedics;  Laterality: Right;   I & D EXTREMITY Right 01/30/2020   Procedure: DEBRIDEMENT ABSCESS RIGHT FOOT;  Surgeon: [name], MD;  Location: [facility] OR;  Service: Orthopedics;  Laterality: Right;   I & D EXTREMITY Right 04/07/2020   Procedure: PARTIAL TARSAL EXCISION RIGHT FOOT;  Surgeon: [name], MD;  Location: [facility] OR;  Service: Orthopedics;  Laterality: Right;   STUMP REVISION Left  08/16/2022   Procedure: REVISION LEFT TRANSMETATARSAL AMPUTATION;  Surgeon: [name], MD;  Location: [facility] OR;  Service: Orthopedics;  Laterality: Left;   Social History   Socioeconomic History   Marital status: Significant Other    Spouse name: Not on file   Number of children: Not on file   Years of education: Not on file   Highest education level: Not on file  Occupational History   Not on file  Tobacco Use   Smoking status: Former    Current packs/day: 0.00    Types: Cigarettes    Start date: 50    Quit date: 2007    Years since quitting: 17.8   Smokeless tobacco: Never   Tobacco comments:    03/16/20- quit 12- 15 years ago  Vaping Use   Vaping status: Never Used  Substance and Sexual Activity   Alcohol use: Yes    Alcohol/week: 6.0 - 8.0 standard drinks of alcohol    Types: 6 - 8 Cans of beer per week   Drug use: Never   Sexual activity: Yes  Other Topics Concern   Not on file  Social History Narrative   Not on file   Social Determinants of Health   Financial Resource Strain: Not on file  Food Insecurity: Food Insecurity Present (03/10/2023)   Hunger Vital Sign    Worried About Running Out of Food in the Last Year: Sometimes true    Ran Out of Food in the Last Year: Sometimes true  Transportation Needs: No Transportation Needs (03/10/2023)  PRAPARE - Administrator, Civil Service (Medical): No    Lack of Transportation (Non-Medical): No  Physical Activity: Not on file  Stress: Not on file  Social Connections: Not on file   Family History  Problem Relation Age of Onset   Diabetes Mother    - negative except otherwise stated in the family history section Allergies  Allergen Reactions   Bee Venom Hives   Metformin And Related Diarrhea   Prior to Admission medications   Medication Sig Start Date End Date Taking? Authorizing Provider  doxycycline (VIBRA-TABS) 100 MG tablet Take 1 tablet (100 mg total) by mouth 2 (two) times daily. 02/20/23   Yes [name], MD  ibuprofen (ADVIL) 200 MG tablet Take 200 mg by mouth every 6 (six) hours as needed for moderate pain.   Yes [provider]   VAS [place] ABI WITH/WO TBI  Result Date: 03/11/2023  LOWER EXTREMITY DOPPLER STUDY Patient Name:  Jon House  Date of Exam:   03/11/2023 Medical Rec #: [id number]             Accession #:    [id number] Date of Birth: 08/03/1970             Patient Gender: M Patient Age:   59 years Exam Location:  [facility] Procedure:      VAS [place] ABI WITH/WO TBI Referring Phys: TIMOTHY OPYD --------------------------------------------------------------------------------  Indications: DM foot infection High Risk Factors: Diabetes, past history of smoking.  Vascular Interventions: LLE TMA, RLE great toe amputation. Comparison Study: Previous exam 07/29/2018 Performing Technologist: Ernestene Mention RVT/RDMS  Examination Guidelines: A complete evaluation includes at minimum, Doppler waveform signals and systolic blood pressure reading at the level of bilateral brachial, anterior tibial, and posterior tibial arteries, when vessel segments are accessible. Bilateral testing is considered an integral part of a complete examination. Photoelectric Plethysmograph (PPG) waveforms and toe systolic pressure readings are included as required and additional duplex testing as needed. Limited examinations for reoccurring indications may be performed as noted.  ABI Findings: +--------+------------------+-----+---------+--------+ Right   Rt Pressure (mmHg)IndexWaveform Comment  +--------+------------------+-----+---------+--------+ [id number]                    triphasic         +--------+------------------+-----+---------+--------+ PTA     169               1.26 biphasic          +--------+------------------+-----+---------+--------+ DP      181               1.35 triphasic         +--------+------------------+-----+---------+--------+  +--------+------------------+-----+---------+-------+ Left    Lt Pressure (mmHg)IndexWaveform Comment +--------+------------------+-----+---------+-------+ [id number]                    triphasic        +--------+------------------+-----+---------+-------+ PTA     183               1.37 triphasic        +--------+------------------+-----+---------+-------+ DP      187               1.40 triphasic        +--------+------------------+-----+---------+-------+ +-------+-----------+-----------+------------+------------+ ABI/TBIToday's ABIToday's TBIPrevious ABIPrevious TBI +-------+-----------+-----------+------------+------------+ Right  1.35       amp        1.18                     +-------+-----------+-----------+------------+------------+  Left   1.37       amp        1.21                     +-------+-----------+-----------+------------+------------+  Arterial wall calcification precludes accurate ankle pressures and ABIs.  Summary: Right: Resting right ankle-brachial index indicates noncompressible right lower extremity arteries. Left: Resting left ankle-brachial index indicates noncompressible left lower extremity arteries. *See table(s) above for measurements and observations.     Preliminary    DG Chest 2 View  Result Date: 03/10/2023 CLINICAL DATA:  Chest pain. EXAM: CHEST - 2 VIEW COMPARISON:  None Available. FINDINGS: The heart size and mediastinal contours are within normal limits. There is mild bibasilar atelectasis/airspace disease. No pleural effusion or pneumothorax. The visualized skeletal structures are unremarkable. IMPRESSION: Mild bibasilar atelectasis/airspace disease. Electronically Signed   By: Romona Curls M.D.   On: 03/10/2023 14:53   DG Foot Complete Left  Result Date: 03/10/2023 CLINICAL DATA:  Foot pain, concern for osteomyelitis. EXAM: LEFT FOOT - COMPLETE 3+ VIEW COMPARISON:  Foot radiograph dated 02/20/2023. FINDINGS: The patient is  status post foot amputation at the level of the metatarsals. No acute fracture is identified. There is osseous demineralization of the fifth metatarsal with surrounding soft tissue swelling, increased since 02/20/2023. IMPRESSION: Osseous demineralization of the fifth metatarsal with surrounding soft tissue swelling, concerning for osteomyelitis. Electronically Signed   By: Romona Curls M.D.   On: 03/10/2023 14:50   - pertinent xrays, CT, MRI studies were reviewed and independently interpreted  Positive ROS: All other systems have been reviewed and were otherwise negative with the exception of those mentioned in the HPI and as above.  Physical Exam: General: Alert, no acute distress Psychiatric: Patient is competent for consent with normal mood and affect Lymphatic: No axillary or cervical lymphadenopathy Cardiovascular: No pedal edema Respiratory: No cyanosis, no use of accessory musculature GI: No organomegaly, abdomen is soft and non-tender    Images:  @ENCIMAGES @  Labs:  Lab Results  Component Value Date   HGBA1C 10.3 (H) 03/11/2023   HGBA1C 10.2 (A) 07/07/2019   HGBA1C 9.6 (A) 10/02/2018   ESRSEDRATE 130 (H) 03/11/2023   ESRSEDRATE 3 07/07/2019   ESRSEDRATE 69 (H) 10/06/2018   CRP 25.8 (H) 03/11/2023   CRP 13.7 (H) 10/06/2018   CRP 19.7 (H) 10/05/2018   REPTSTATUS PENDING 03/10/2023   GRAMSTAIN  01/30/2020    RARE WBC PRESENT, PREDOMINANTLY PMN RARE GRAM POSITIVE COCCI    CULT  03/10/2023    NO GROWTH 2 DAYS Performed at [facility] Lab, [address] [place], [place] [address]    LABORGA ENTEROBACTER CLOACAE 01/30/2020   LABORGA ENTEROCOCCUS FAECALIS 01/30/2020    Lab Results  Component Value Date   ALBUMIN 3.0 (L) 03/10/2023   ALBUMIN 3.9 10/03/2018   ALBUMIN 4.7 08/05/2018   PREALBUMIN <5 (L) 03/11/2023   PREALBUMIN 9.6 (L) 10/04/2018        Latest Ref Rng & Units 03/12/2023    7:20 AM 03/11/2023    6:02 AM 03/10/2023    2:26 PM  CBC EXTENDED   WBC 4.0 - 10.5 K/uL 6.0  7.7  12.4   RBC 4.22 - 5.81 MIL/uL 3.61  3.38    3.38  3.13   Hemoglobin 13.0 - 17.0 g/dL 60.4  9.7  9.4   HCT 54.0 - 52.0 % 31.4  29.4  27.6   Platelets 150 - 400 K/uL 364  313  303  NEUT# 1.7 - 7.7 K/uL   10.6   Lymph# 0.7 - 4.0 K/uL   0.7     Neurologic: Patient does not have protective sensation bilateral lower extremities.   MUSCULOSKELETAL:   Skin: Examination there is swelling and tenderness to palpation left foot.  There is an ulcer laterally with drainage.  Patient has a palpable dorsalis pedis pulse.  Review of the radiographs shows destructive bony changes consistent with osteomyelitis of the remainder of the forefoot.  Assessment: Assessment: Abscess and ulceration with osteomyelitis left transmetatarsal amputation.  Plan: Plan: Discussed with patient recommendation to proceed with a transtibial amputation.  Risks and benefits were discussed patient states he understands wished to proceed at this time.  Plan for surgery on Wednesday.  Thank you for the consult and the opportunity to see Mr. English Coit, MD [facility] [phone] 8:27 AM

## 2023-03-12 NOTE — TOC Initial Note (Signed)
Transition of Care [facility]) - Initial/Assessment Note    Patient Details  Name: Jon House MRN: [id number] Date of Birth: 01-Dec-1970  Transition of Care [facility]) CM/SW Contact:    Kingsley Plan, RN Phone Number: 03/12/2023, 12:38 PM  Clinical Narrative:                  Spoke to patient at bedside via [employer] Interpreter Wynona Canes [phone]   Patient from home with wife and adult children  Patient has a wheelchair and crutches at home.   Patient does not have insurance or PCP.   Patient agreeable to a follow up appointment scheduled with a [facility]. Information on AVS.   Referral to financial counselor sent.   NCM changed pharmacy to [facility] at discharge. TOC Pharmacy will call patient at discharge with cost of medication, he can pay by cash, card or be billed.   After discharge he can use [employer]    Patient has transportation to appointments (daughter).   Patient for surgery on Wednesday .   Consult for home health and DME. Will await post surgery PT recommendations and orders .    Expected Discharge Plan: Home/Self Care Barriers to Discharge: Continued Medical Work up   Patient Goals and CMS Choice Patient states their goals for this hospitalization and ongoing recovery are:: to return to home          Expected Discharge Plan and Services   Discharge Planning Services: CM Consult   Living arrangements for the past 2 months: Single Family Home                 DME Arranged: N/A         HH Arranged: NA          Prior Living Arrangements/Services Living arrangements for the past 2 months: Single Family Home Lives with:: Adult Children, Spouse Patient language and need for interpreter reviewed:: Yes Do you feel safe going back to the place where you live?: Yes      Need for Family Participation in Patient Care: Yes (Comment) Care giver support system in place?: Yes (comment) Current home services:  DME Criminal Activity/Legal Involvement Pertinent to Current Situation/Hospitalization: No - Comment as needed  Activities of Daily Living   ADL Screening (condition at time of admission) Independently performs ADLs?: Yes (appropriate for developmental age) Is the patient deaf or have difficulty hearing?: No Does the patient have difficulty seeing, even when wearing glasses/contacts?: No Does the patient have difficulty concentrating, remembering, or making decisions?: No  Permission Sought/Granted   Permission granted to share information with : No              Emotional Assessment Appearance:: Appears stated age Attitude/Demeanor/Rapport: Engaged Affect (typically observed): Accepting Orientation: : Oriented to Self, Oriented to Place, Oriented to  Time, Oriented to Situation Alcohol / Substance Use: Not Applicable Psych Involvement: No (comment)  Admission diagnosis:  Foot osteomyelitis, left (HCC) [M86.9] Osteomyelitis of left foot, unspecified type [facility]) [M86.9] Patient Active Problem List   Diagnosis Date Noted   Insulin dependent type 2 diabetes mellitus (HCC) 03/10/2023   Foot osteomyelitis, left (HCC) 03/10/2023   Normocytic anemia 03/10/2023   Dehiscence of amputation stump of left lower extremity (HCC) 08/16/2022   Subacute osteomyelitis, right ankle and foot (HCC)    Osteomyelitis of great toe of right foot (HCC)    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 [place] [address] Phone: [phone] Fax: [phone]  [id number] [place], [place] - [address]  AT [facility] OF [facility] [address] [place] [address] Phone: [phone] Fax: [phone]     Social Determinants of Health (SDOH) Social History: SDOH Screenings   Food Insecurity: Food Insecurity Present (03/10/2023)  Housing: Low Risk  (03/10/2023)  Transportation Needs: No Transportation Needs (03/10/2023)  Utilities: Not At Risk (03/10/2023)  Depression (PHQ2-9): Low Risk  (10/02/2018)  Tobacco Use: Medium Risk (03/10/2023)   SDOH Interventions:     Readmission Risk Interventions     No data to display

## 2023-03-12 NOTE — Progress Notes (Signed)
PROGRESS NOTE    Jon House  ZOX:[id number] DOB: [birth year] DOA: 03/10/2023 PCP: Patient, No Pcp Per    Brief Narrative:   Jon House is a 52 y.o. 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 Patient reports that the left foot has continued to worsen with increasing pain, drainage, and odor despite just completing a course of doxycycline. He has developed fever and general malaise with nausea and nonbloody vomiting.   In the ED, temperature 98.7 F, HR 107, RR 20, BP 99/59, SpO2 95% on room air.  WBC 12.4, hemoglobin 9.4, platelet count 303.  Sodium 135, potassium 3.8, chloride 102, CO2 25, glucose 149, BUN 28, creat 1.19.  AST 11, ALT 14, total bilirubin 0.4.  High sensitive troponin 9 followed by 7.  Urinalysis with greater than 500 glucose, otherwise unrevealing.  MRSA PCR negative.  MSSA PCR positive.  Chest x-ray with mild bibasilar atelectasis/airspace disease.  Left foot x-ray with osseous demineralization of the fifth metatarsal with surrounding soft tissue swelling, concerning for osteomyelitis.  Assessment & Plan:    Acute left foot osteomyelitis Patient presenting to the ED with progressive left foot wound with foul odor, drainage associate with fever, nausea/vomiting over the last several days.  Previous history of diabetic foot infections and osteomyelitis s/p TMA.  On arrival, patient has been afebrile with elevated WBC count of 12.4. -- Orthopedics following, appreciate assistance -- MRI left foot 11/6: Pending -- Tylenol 650 mg p.o. every 6 hours as needed mild pain -- Oxycodone 5 mg p.o. every 4 hours.  Moderate pain -- Dilaudid 0.5 mg IV every 4 hours as needed severe pain -- Vancomycin, pharmacy consulted  for dosing/monitoring -- Cefepime 2 g IV every 8 hours -- Metronidazole 500 mg IV every 12 hours -- Orthopedics plans left transtibial amputation on 03/14/2023 -- CBC daily  Type 2 diabetes mellitus, with hyperglycemia Hemoglobin A1c 10.3, poorly controlled.  Home regimen includes NPH 70-3010-20 units twice daily -- Diabetic educator consult -- Semglee 20 units [facility] daily -- Novolog 3u TIDAC -- SSI for coverage -- CBGs qAC/HS  DVT prophylaxis: heparin injection 5,000 Units Start: 03/10/23 1700    Code Status: Full Code Family Communication: No family present at bedside this morning  Disposition Plan:  Level of care: Med-Surg Status is: Inpatient Remains inpatient appropriate because: Pending left transtibial amputation on 03/14/2023    Consultants:  Orthopedics, Dr. Magnus Ivan, Dr. Lajoyce Corners  Procedures:  None  Antimicrobials:  Vancomycin 11/9>> Cefepime 11/9>> Metronidazole 11/9>>   Subjective: Patient seen examined bedside, resting calmly.  Lying in bed.  Seen by orthopedics this morning, and plan for transtibial amputation on Wednesday. Continues on IV antibiotics.  Currently reports pain controlled.  Remains afebrile.  No other questions or concerns at this time.  Denies headache, no dizziness, no chest pain, no palpitations, no shortness of breath, no abdominal pain, no fever/chills/night sweats, no nausea/vomiting/diarrhea, no focal weakness, no fatigue, no paresthesia.  New acute events overnight per nursing staff.  Objective: Vitals:   03/11/23 0913 03/11/23 1300 03/11/23 1607 03/12/23 0951  BP: 124/70  134/75 139/78  Pulse: 86  87 85  Resp: 17  17 18   Temp: 98.2 F (36.8 C)  98.5 F (36.9 C) 97.8 F (36.6 C)  TempSrc:    Oral  SpO2: 99% 97%  98%  Weight:      Height:        Intake/Output Summary (Last 24 hours) at 03/12/2023 1111 Last data filed at 03/12/2023 0900 Gross per 24 hour  Intake 1011.13 ml  Output --  Net 1011.13 ml   Filed Weights   03/10/23  1116  Weight: 67.6 kg    Examination:  Physical Exam: GEN: NAD, alert and oriented x 3, wd/wn HEENT: NCAT, PERRL, EOMI, sclera clear, MMM PULM: CTAB w/o wheezes/crackles, normal respiratory effort, on room air CV: RRR w/o M/G/R GI: abd soft, NTND, NABS, no R/G/M MSK: + edema left foot/ankle, muscle strength globally intact 5/5 bilateral upper/lower extremities NEURO: CN II-XII intact, no focal deficits, sensation to light touch intact PSYCH: normal mood/affect Integumentary: left foot stump noted with ulcerations, foul-smelling with purulent discharge from wound site       Data Reviewed: I have personally reviewed following labs and imaging studies  CBC: Recent Labs  Lab 03/10/23 1426 03/11/23 0602 03/12/23 0720  WBC 12.4* 7.7 6.0  NEUTROABS 10.6*  --   --   HGB 9.4* 9.7* 10.4*  HCT 27.6* 29.4* 31.4*  MCV 88.2 87.0 87.0  PLT 303 313 364   Basic Metabolic Panel: Recent Labs  Lab 03/10/23 1426 03/11/23 0602 03/12/23 0720  NA 135 135 132*  K 3.8 3.8 4.2  CL 102 102 99  CO2 25 23 24   GLUCOSE 149* 228* 262*  BUN 28* 19 15  CREATININE 1.19 0.78 0.68  CALCIUM 8.5* 8.5* 8.6*   GFR: Estimated Creatinine Clearance: 95.3 mL/min (by C-G formula based on SCr of 0.68 mg/dL). Liver Function Tests: Recent Labs  Lab 03/10/23 1426  AST 11*  ALT 14  ALKPHOS 81  BILITOT 0.4  PROT 7.6  ALBUMIN 3.0*   No results for input(s): "LIPASE", "AMYLASE" in the last 168 hours. No results for input(s): "AMMONIA" in the last 168 hours. Coagulation Profile: No results for input(s): "INR", "PROTIME" in the last 168 hours. Cardiac Enzymes: No results for input(s): "CKTOTAL", "CKMB", "CKMBINDEX", "TROPONINI" in the last 168 hours. BNP (last 3 results) No results for input(s): "PROBNP" in the last 8760 hours. HbA1C: Recent Labs    03/11/23 0602  HGBA1C 10.3*   CBG: Recent Labs  Lab 03/10/23 2228 03/11/23 0914 03/11/23 1134 03/11/23 1605 03/11/23 2057  GLUCAP 205* 212*  165* 195* 260*   Lipid Profile: No results for input(s): "CHOL", "HDL", "LDLCALC", "TRIG", "CHOLHDL", "LDLDIRECT" in the last 72 hours. Thyroid Function Tests: No results for input(s): "TSH", "T4TOTAL", "FREET4", "T3FREE", "THYROIDAB" in the last 72 hours. Anemia Panel: Recent Labs    03/11/23 0602  VITAMINB12 1,871*  FOLATE 15.8  FERRITIN 311  TIBC 206*  IRON 20*  RETICCTPCT 1.1   Sepsis Labs: No results for input(s): "PROCALCITON", "LATICACIDVEN" in the last 168 hours.  Recent Results (from the past 240 hour(s))  Blood culture (routine x 2)     Status: None (Preliminary result)   Collection Time: 03/10/23  4:03 PM   Specimen: BLOOD  Result Value Ref Range Status   Specimen Description   Final    BLOOD LEFT ANTECUBITAL Performed at [facility], [address] [place], [place] [address]    Special Requests   Final    BOTTLES DRAWN AEROBIC AND ANAEROBIC Blood Culture adequate volume Performed at [facility], [address] [place], [place] [address]    Culture   Final    NO GROWTH 2  DAYS Performed at [facility], [address], [place], [place] [address]    Report Status PENDING  Incomplete  Blood culture (routine x 2)     Status: None (Preliminary result)   Collection Time: 03/10/23  4:05 PM   Specimen: BLOOD  Result Value Ref Range Status   Specimen Description   Final    BLOOD RIGHT ANTECUBITAL Performed at [facility], [address] [place], [place] [address]    Special Requests   Final    BOTTLES DRAWN AEROBIC AND ANAEROBIC Blood Culture adequate volume Performed at [facility], [address], [place], [place] [address]    Culture   Final    NO GROWTH 2 DAYS Performed at [facility], [address], [place], [place] [address]    Report Status PENDING  Incomplete  Surgical PCR screen     Status: Abnormal   Collection Time: 03/11/23 12:33 AM   Specimen: Nasal  Mucosa; Nasal Swab  Result Value Ref Range Status   MRSA, PCR NEGATIVE NEGATIVE Final   Staphylococcus aureus POSITIVE (A) NEGATIVE Final    Comment: (NOTE) The Xpert SA Assay (FDA approved for NASAL specimens in patients 51 years of age and older), is one component of a comprehensive surveillance program. It is not intended to diagnose infection nor to guide or monitor treatment. Performed at [facility], [address] [place], [place] [address]          Radiology Studies: VAS [place] ABI WITH/WO TBI  Result Date: 03/11/2023  LOWER EXTREMITY DOPPLER STUDY Patient Name:  Jon House  Date of Exam:   03/11/2023 Medical Rec #: [id number]             Accession #:    [id number] Date of Birth: 1970-05-30             Patient Gender: M Patient Age:   14 years Exam Location:  [facility] Procedure:      VAS [place] ABI WITH/WO TBI Referring Phys: TIMOTHY OPYD --------------------------------------------------------------------------------  Indications: DM foot infection High Risk Factors: Diabetes, past history of smoking.  Vascular Interventions: LLE TMA, RLE great toe amputation. Comparison Study: Previous exam 07/29/2018 Performing Technologist: Ernestene Mention RVT/RDMS  Examination Guidelines: A complete evaluation includes at minimum, Doppler waveform signals and systolic blood pressure reading at the level of bilateral brachial, anterior tibial, and posterior tibial arteries, when vessel segments are accessible. Bilateral testing is considered an integral part of a complete examination. Photoelectric Plethysmograph (PPG) waveforms and toe systolic pressure readings are included as required and additional duplex testing as needed. Limited examinations for reoccurring indications may be performed as noted.  ABI Findings: +--------+------------------+-----+---------+--------+ Right   Rt Pressure (mmHg)IndexWaveform Comment  +--------+------------------+-----+---------+--------+  [id number]                    triphasic         +--------+------------------+-----+---------+--------+ PTA     169               1.26 biphasic          +--------+------------------+-----+---------+--------+ DP      181               1.35 triphasic         +--------+------------------+-----+---------+--------+ +--------+------------------+-----+---------+-------+ Left    Lt Pressure (mmHg)IndexWaveform Comment +--------+------------------+-----+---------+-------+ [id number]  triphasic        +--------+------------------+-----+---------+-------+ PTA     183               1.37 triphasic        +--------+------------------+-----+---------+-------+ DP      187               1.40 triphasic        +--------+------------------+-----+---------+-------+ +-------+-----------+-----------+------------+------------+ ABI/TBIToday's ABIToday's TBIPrevious ABIPrevious TBI +-------+-----------+-----------+------------+------------+ Right  1.35       amp        1.18                     +-------+-----------+-----------+------------+------------+ Left   1.37       amp        1.21                     +-------+-----------+-----------+------------+------------+  Arterial wall calcification precludes accurate ankle pressures and ABIs.  Summary: Right: Resting right ankle-brachial index indicates noncompressible right lower extremity arteries. Left: Resting left ankle-brachial index indicates noncompressible left lower extremity arteries. *See table(s) above for measurements and observations.     Preliminary    DG Chest 2 View  Result Date: 03/10/2023 CLINICAL DATA:  Chest pain. EXAM: CHEST - 2 VIEW COMPARISON:  None Available. FINDINGS: The heart size and mediastinal contours are within normal limits. There is mild bibasilar atelectasis/airspace disease. No pleural effusion or pneumothorax. The visualized skeletal structures are unremarkable. IMPRESSION:  Mild bibasilar atelectasis/airspace disease. Electronically Signed   By: Romona Curls M.D.   On: 03/10/2023 14:53   DG Foot Complete Left  Result Date: 03/10/2023 CLINICAL DATA:  Foot pain, concern for osteomyelitis. EXAM: LEFT FOOT - COMPLETE 3+ VIEW COMPARISON:  Foot radiograph dated 02/20/2023. FINDINGS: The patient is status post foot amputation at the level of the metatarsals. No acute fracture is identified. There is osseous demineralization of the fifth metatarsal with surrounding soft tissue swelling, increased since 02/20/2023. IMPRESSION: Osseous demineralization of the fifth metatarsal with surrounding soft tissue swelling, concerning for osteomyelitis. Electronically Signed   By: Romona Curls M.D.   On: 03/10/2023 14:50        Scheduled Meds:  heparin  5,000 Units Subcutaneous Q8H   influenza vac split trivalent PF  0.5 mL Intramuscular Tomorrow-1000   insulin aspart  0-5 Units Subcutaneous QHS   insulin aspart  0-9 Units Subcutaneous TID WC   insulin aspart  3 Units Subcutaneous TID WC   insulin glargine-yfgn  20 Units Subcutaneous Daily   metroNIDAZOLE  500 mg Oral Q12H   mupirocin ointment  1 Application Nasal BID   pneumococcal 20-valent conjugate vaccine  0.5 mL Intramuscular Tomorrow-1000   Continuous Infusions:  ceFEPime (MAXIPIME) IV 2 g (03/12/23 0659)   vancomycin 200 mL/hr at 03/12/23 0625     LOS: 2 days    Time spent: 52 minutes spent on chart review, discussion with nursing staff, consultants, updating family and interview/physical exam; more than 50% of that time was spent in counseling and/or coordination of care.    Alvira Philips [place], DO [facility] Available via Epic secure chat 7am-7pm After these hours, please refer to coverage provider listed on [email or web address] 03/12/2023, 11:11 AM

## 2023-03-12 NOTE — Plan of Care (Signed)
  Problem: Education: Goal: Ability to describe self-care measures that may prevent or decrease complications (Diabetes Survival Skills Education) will improve Outcome: Progressing Goal: Individualized Educational Video(s) Outcome: Progressing   Problem: Coping: Goal: Ability to adjust to condition or change in health will improve Outcome: Progressing   

## 2023-03-12 NOTE — Progress Notes (Addendum)
Initial Nutrition Assessment  DOCUMENTATION CODES:   Severe malnutrition in context of acute illness/injury  INTERVENTION:   - Ensure Enlive daily with breakfast, each supplement provides 350 kcal and 20 grams of protein.  - 1 packet Juven BID, each packet provides 95 calories, 2.5 grams of protein (collagen), and 9.8 grams of carbohydrate (3 grams sugar); also contains 7 grams of L-arginine and L-glutamine, 300 mg vitamin C, 15 mg vitamin E, 1.2 mcg vitamin B-12, 9.5 mg zinc, 200 mg calcium, and 1.5 g  Calcium Beta-hydroxy-Beta-methylbutyrate to support wound healing  - Provide MVI with minerals daily   - Continue Good PO intake   NUTRITION DIAGNOSIS:   Severe Malnutrition related to acute illness as evidenced by energy intake < or equal to 50% for > or equal to 5 days, severe muscle depletion, severe fat depletion.  GOAL:   Patient will meet greater than or equal to 90% of their needs  MONITOR:   PO intake, Supplement acceptance, Labs, Weight trends  REASON FOR ASSESSMENT:   Consult Wound healing  ASSESSMENT:  52 y.o Male with PMH of T2DM, diabetic foot infections with osteomyelitis s/p right/left TMA with recent revision in April 2024. Presents 03/10/2023 with left foot pain, drainage, odor, fever, and N/V.  Pt needed translator: Wynona Canes, [phone]. Pt reports losing his appetite 3 weeks ago when his fever started. He was only eating 1 meal per day during this time. Pt states he thinks he has been losing weight in the last 3 weeks but does not know how much, he reports his usual body weight to be around 165 lbs. Pt has lost around 15 lbs since then.   During his admission pt reports his appetite returning to normal and upon visit noticed 100% of his breakfast tray was consumed. Discussed with pt the importance for high protein foods with his upcoming surgery and recovery of his wounds. Provided pt with "High protein foods list" handout in spanish. Pt was willing to try Ensure  Enlive daily and Juven BID.   If continued good PO intake may consider switching to Glucerna.   Transtibial amputation, surgery planned for Wednesday 03/14/2023.   Admit weight: 67.6 kg Current weight: 67.6 kg   Average Meal Intake: 11/11: 100% intake x 1 recorded meals  Nutritionally Relevant Medications: Scheduled Meds:  insulin aspart  0-5 Units Subcutaneous QHS   insulin aspart  0-9 Units Subcutaneous TID WC   insulin aspart  3 Units Subcutaneous TID WC   insulin glargine-yfgn  20 Units Subcutaneous Daily   metroNIDAZOLE  500 mg Oral Q12H   Continuous Infusions:  ceFEPime (MAXIPIME) IV 2 g (03/12/23 1255)   vancomycin 200 mL/hr at 03/12/23 9811   Labs Reviewed: Calcium 8.6, Sodium 132 CBG ranges from 165-236 mg/dL over the last 24 hours HgbA1c 10.3   NUTRITION - FOCUSED PHYSICAL EXAM:  Flowsheet Row Most Recent Value  Orbital Region Moderate depletion  Upper Arm Region Severe depletion  Thoracic and Lumbar Region Moderate depletion  Buccal Region Moderate depletion  Temple Region Moderate depletion  Clavicle Bone Region Moderate depletion  Clavicle and Acromion Bone Region Moderate depletion  Scapular Bone Region Moderate depletion  Dorsal Hand Severe depletion  Patellar Region Severe depletion  Anterior Thigh Region Severe depletion  Posterior Calf Region Severe depletion  Edema (RD Assessment) None  Hair Reviewed  Eyes Reviewed  Mouth Reviewed  Skin Reviewed  Nails Reviewed       Diet Order:   Diet Order  Diet Carb Modified Fluid consistency: Thin; Room service appropriate? Yes  Diet effective now                   EDUCATION NEEDS:   Education needs have been addressed  Skin:  Skin Assessment: Skin Integrity Issues: Skin Integrity Issues:: Other (Comment) Other: L foot wound, R TMA  Last BM:  PTA  Height:   Ht Readings from Last 1 Encounters:  03/10/23 5\' 6"  (1.676 m)    Weight:   Wt Readings from Last 1  Encounters:  03/10/23 67.6 kg    BMI:  Body mass index is 24.05 kg/m.  Estimated Nutritional Needs:   Kcal:  1800-2000  Protein:  90-105g  Fluid:  >/= 1.8L    Elliot Dally, RD Registered Dietitian  See Amion for more information

## 2023-03-12 NOTE — Inpatient Diabetes Management (Signed)
Inpatient Diabetes Program Recommendations  AACE/ADA: New Consensus Statement on Inpatient Glycemic Control (2015)  Target Ranges:  Prepandial:   less than 140 mg/dL      Peak postprandial:   less than 180 mg/dL (1-2 hours)      Critically ill patients:  140 - 180 mg/dL   Lab Results  Component Value Date   GLUCAP 236 (H) 03/12/2023   HGBA1C 10.3 (H) 03/11/2023    Review of Glycemic Control  Diabetes history: type 2 Outpatient Diabetes medications: 70/30 insulin 5 units BID (per patient) Current orders for Inpatient glycemic control: Semglee 20 units daily, Novolog 0-9 units correction scale TID, Novolog 0-5 units HS scale, Novolog 3 units TID with meals  Inpatient Diabetes Program Recommendations:   Spoke briefly with patient with [facility] interpreter Rolm Gala [phone].  Patient states that he was diagnosed with diabetes about 14 years ago. States that he does have a home blood glucose meter at home. Discussed his hgbA1C of 10.3% and that it means that his blood sugars have been running >240 mg/dl on an average over 2-3 month period. Recommend that he write the blood sugars down when taken. States that he does not have a current PCP to follow him. He does not have any health insurance. Was given a guide for the "plate method" for eating at home.   Will continue to monitor blood sugars while in the hospital.  Smith Mince RN BSN CDE Diabetes Coordinator Pager: [phone]  8am-5pm

## 2023-03-12 NOTE — H&P (View-Only) (Signed)
ORTHOPAEDIC CONSULTATION  REQUESTING PHYSICIAN: [place], Eric J, DO  Chief Complaint: Abscess and ulceration left transmetatarsal amputation.  HPI: Jon House is a 52 y.o. male who presents with abscess and ulceration left transmetatarsal amputation.  Patient is 7 months status post revision of his previous transmetatarsal amputation.  Patient reports fever pain and swelling.  Past Medical History:  Diagnosis Date   Diabetes mellitus without complication (HCC)    Type II   Infection 10/04/2018   LEFT FOOT   Osteomyelitis of great toe of left foot (HCC) 07/29/2018   Subacute osteomyelitis, left ankle and foot [facility])    Past Surgical History:  Procedure Laterality Date   AMPUTATION Left 07/31/2018   Procedure: LEFT GREAT TOE AMPUTATION;  Surgeon: [name], MD;  Location: [facility] OR;  Service: Orthopedics;  Laterality: Left;   AMPUTATION Left 10/09/2018   Procedure: LEFT FOOT 2ND TOE AND POSSIBLE 3RD TOE AMPUTATION;  Surgeon: [name], MD;  Location: [facility] OR;  Service: Orthopedics;  Laterality: Left;   AMPUTATION Right 03/17/2020   Procedure: RIGHT GREAT TOE AMPUTATION THROUGH METATARSAL;  Surgeon: [name], MD;  Location: [facility] OR;  Service: Orthopedics;  Laterality: Right;   AMPUTATION Left 10/22/2020   Procedure: LEFT TRANSMETATARSAL AMPUTATION;  Surgeon: [name], MD;  Location: [facility] OR;  Service: Orthopedics;  Laterality: Left;   APPLICATION OF WOUND VAC Right 03/17/2020   Procedure: APPLICATION OF WOUND VAC;  Surgeon: [name], MD;  Location: [facility] OR;  Service: Orthopedics;  Laterality: Right;   I & D EXTREMITY Right 01/30/2020   Procedure: DEBRIDEMENT ABSCESS RIGHT FOOT;  Surgeon: [name], MD;  Location: [facility] OR;  Service: Orthopedics;  Laterality: Right;   I & D EXTREMITY Right 04/07/2020   Procedure: PARTIAL TARSAL EXCISION RIGHT FOOT;  Surgeon: [name], MD;  Location: [facility] OR;  Service: Orthopedics;  Laterality: Right;   STUMP REVISION Left  08/16/2022   Procedure: REVISION LEFT TRANSMETATARSAL AMPUTATION;  Surgeon: [name], MD;  Location: [facility] OR;  Service: Orthopedics;  Laterality: Left;   Social History   Socioeconomic History   Marital status: Significant Other    Spouse name: Not on file   Number of children: Not on file   Years of education: Not on file   Highest education level: Not on file  Occupational History   Not on file  Tobacco Use   Smoking status: Former    Current packs/day: 0.00    Types: Cigarettes    Start date: 50    Quit date: 2007    Years since quitting: 17.8   Smokeless tobacco: Never   Tobacco comments:    03/16/20- quit 12- 15 years ago  Vaping Use   Vaping status: Never Used  Substance and Sexual Activity   Alcohol use: Yes    Alcohol/week: 6.0 - 8.0 standard drinks of alcohol    Types: 6 - 8 Cans of beer per week   Drug use: Never   Sexual activity: Yes  Other Topics Concern   Not on file  Social History Narrative   Not on file   Social Determinants of Health   Financial Resource Strain: Not on file  Food Insecurity: Food Insecurity Present (03/10/2023)   Hunger Vital Sign    Worried About Running Out of Food in the Last Year: Sometimes true    Ran Out of Food in the Last Year: Sometimes true  Transportation Needs: No Transportation Needs (03/10/2023)  PRAPARE - Administrator, Civil Service (Medical): No    Lack of Transportation (Non-Medical): No  Physical Activity: Not on file  Stress: Not on file  Social Connections: Not on file   Family History  Problem Relation Age of Onset   Diabetes Mother    - negative except otherwise stated in the family history section Allergies  Allergen Reactions   Bee Venom Hives   Metformin And Related Diarrhea   Prior to Admission medications   Medication Sig Start Date End Date Taking? Authorizing Provider  doxycycline (VIBRA-TABS) 100 MG tablet Take 1 tablet (100 mg total) by mouth 2 (two) times daily. 02/20/23   Yes [name], MD  ibuprofen (ADVIL) 200 MG tablet Take 200 mg by mouth every 6 (six) hours as needed for moderate pain.   Yes [provider]   VAS [place] ABI WITH/WO TBI  Result Date: 03/11/2023  LOWER EXTREMITY DOPPLER STUDY Patient Name:  Jon House  Date of Exam:   03/11/2023 Medical Rec #: [id number]             Accession #:    [id number] Date of Birth: 08/03/1970             Patient Gender: M Patient Age:   59 years Exam Location:  [facility] Procedure:      VAS [place] ABI WITH/WO TBI Referring Phys: TIMOTHY OPYD --------------------------------------------------------------------------------  Indications: DM foot infection High Risk Factors: Diabetes, past history of smoking.  Vascular Interventions: LLE TMA, RLE great toe amputation. Comparison Study: Previous exam 07/29/2018 Performing Technologist: Ernestene Mention RVT/RDMS  Examination Guidelines: A complete evaluation includes at minimum, Doppler waveform signals and systolic blood pressure reading at the level of bilateral brachial, anterior tibial, and posterior tibial arteries, when vessel segments are accessible. Bilateral testing is considered an integral part of a complete examination. Photoelectric Plethysmograph (PPG) waveforms and toe systolic pressure readings are included as required and additional duplex testing as needed. Limited examinations for reoccurring indications may be performed as noted.  ABI Findings: +--------+------------------+-----+---------+--------+ Right   Rt Pressure (mmHg)IndexWaveform Comment  +--------+------------------+-----+---------+--------+ [id number]                    triphasic         +--------+------------------+-----+---------+--------+ PTA     169               1.26 biphasic          +--------+------------------+-----+---------+--------+ DP      181               1.35 triphasic         +--------+------------------+-----+---------+--------+  +--------+------------------+-----+---------+-------+ Left    Lt Pressure (mmHg)IndexWaveform Comment +--------+------------------+-----+---------+-------+ [id number]                    triphasic        +--------+------------------+-----+---------+-------+ PTA     183               1.37 triphasic        +--------+------------------+-----+---------+-------+ DP      187               1.40 triphasic        +--------+------------------+-----+---------+-------+ +-------+-----------+-----------+------------+------------+ ABI/TBIToday's ABIToday's TBIPrevious ABIPrevious TBI +-------+-----------+-----------+------------+------------+ Right  1.35       amp        1.18                     +-------+-----------+-----------+------------+------------+  Left   1.37       amp        1.21                     +-------+-----------+-----------+------------+------------+  Arterial wall calcification precludes accurate ankle pressures and ABIs.  Summary: Right: Resting right ankle-brachial index indicates noncompressible right lower extremity arteries. Left: Resting left ankle-brachial index indicates noncompressible left lower extremity arteries. *See table(s) above for measurements and observations.     Preliminary    DG Chest 2 View  Result Date: 03/10/2023 CLINICAL DATA:  Chest pain. EXAM: CHEST - 2 VIEW COMPARISON:  None Available. FINDINGS: The heart size and mediastinal contours are within normal limits. There is mild bibasilar atelectasis/airspace disease. No pleural effusion or pneumothorax. The visualized skeletal structures are unremarkable. IMPRESSION: Mild bibasilar atelectasis/airspace disease. Electronically Signed   By: Romona Curls M.D.   On: 03/10/2023 14:53   DG Foot Complete Left  Result Date: 03/10/2023 CLINICAL DATA:  Foot pain, concern for osteomyelitis. EXAM: LEFT FOOT - COMPLETE 3+ VIEW COMPARISON:  Foot radiograph dated 02/20/2023. FINDINGS: The patient is  status post foot amputation at the level of the metatarsals. No acute fracture is identified. There is osseous demineralization of the fifth metatarsal with surrounding soft tissue swelling, increased since 02/20/2023. IMPRESSION: Osseous demineralization of the fifth metatarsal with surrounding soft tissue swelling, concerning for osteomyelitis. Electronically Signed   By: Romona Curls M.D.   On: 03/10/2023 14:50   - pertinent xrays, CT, MRI studies were reviewed and independently interpreted  Positive ROS: All other systems have been reviewed and were otherwise negative with the exception of those mentioned in the HPI and as above.  Physical Exam: General: Alert, no acute distress Psychiatric: Patient is competent for consent with normal mood and affect Lymphatic: No axillary or cervical lymphadenopathy Cardiovascular: No pedal edema Respiratory: No cyanosis, no use of accessory musculature GI: No organomegaly, abdomen is soft and non-tender    Images:  @ENCIMAGES @  Labs:  Lab Results  Component Value Date   HGBA1C 10.3 (H) 03/11/2023   HGBA1C 10.2 (A) 07/07/2019   HGBA1C 9.6 (A) 10/02/2018   ESRSEDRATE 130 (H) 03/11/2023   ESRSEDRATE 3 07/07/2019   ESRSEDRATE 69 (H) 10/06/2018   CRP 25.8 (H) 03/11/2023   CRP 13.7 (H) 10/06/2018   CRP 19.7 (H) 10/05/2018   REPTSTATUS PENDING 03/10/2023   GRAMSTAIN  01/30/2020    RARE WBC PRESENT, PREDOMINANTLY PMN RARE GRAM POSITIVE COCCI    CULT  03/10/2023    NO GROWTH 2 DAYS Performed at [facility] Lab, [address] [place], [place] [address]    LABORGA ENTEROBACTER CLOACAE 01/30/2020   LABORGA ENTEROCOCCUS FAECALIS 01/30/2020    Lab Results  Component Value Date   ALBUMIN 3.0 (L) 03/10/2023   ALBUMIN 3.9 10/03/2018   ALBUMIN 4.7 08/05/2018   PREALBUMIN <5 (L) 03/11/2023   PREALBUMIN 9.6 (L) 10/04/2018        Latest Ref Rng & Units 03/12/2023    7:20 AM 03/11/2023    6:02 AM 03/10/2023    2:26 PM  CBC EXTENDED   WBC 4.0 - 10.5 K/uL 6.0  7.7  12.4   RBC 4.22 - 5.81 MIL/uL 3.61  3.38    3.38  3.13   Hemoglobin 13.0 - 17.0 g/dL 60.4  9.7  9.4   HCT 54.0 - 52.0 % 31.4  29.4  27.6   Platelets 150 - 400 K/uL 364  313  303  NEUT# 1.7 - 7.7 K/uL   10.6   Lymph# 0.7 - 4.0 K/uL   0.7     Neurologic: Patient does not have protective sensation bilateral lower extremities.   MUSCULOSKELETAL:   Skin: Examination there is swelling and tenderness to palpation left foot.  There is an ulcer laterally with drainage.  Patient has a palpable dorsalis pedis pulse.  Review of the radiographs shows destructive bony changes consistent with osteomyelitis of the remainder of the forefoot.  Assessment: Assessment: Abscess and ulceration with osteomyelitis left transmetatarsal amputation.  Plan: Plan: Discussed with patient recommendation to proceed with a transtibial amputation.  Risks and benefits were discussed patient states he understands wished to proceed at this time.  Plan for surgery on Wednesday.  Thank you for the consult and the opportunity to see Mr. English Coit, MD [facility] [phone] 8:27 AM

## 2023-03-13 LAB — GLUCOSE, CAPILLARY
Glucose-Capillary: 294 mg/dL — ABNORMAL HIGH (ref 70–99)
Glucose-Capillary: 320 mg/dL — ABNORMAL HIGH (ref 70–99)
Glucose-Capillary: 153 mg/dL — ABNORMAL HIGH (ref 70–99)
Glucose-Capillary: 195 mg/dL — ABNORMAL HIGH (ref 70–99)

## 2023-03-13 LAB — BASIC METABOLIC PANEL
Anion gap: 12 (ref 5–15)
BUN: 18 mg/dL (ref 6–20)
CO2: 24 mmol/L (ref 22–32)
Calcium: 8.7 mg/dL — ABNORMAL LOW (ref 8.9–10.3)
Chloride: 97 mmol/L — ABNORMAL LOW (ref 98–111)
Creatinine, Ser: 0.72 mg/dL (ref 0.61–1.24)
GFR, Estimated: >60 mL/min (ref >60)
Glucose, Bld: 251 mg/dL — ABNORMAL HIGH (ref 70–99)
Potassium: 4.2 mmol/L (ref 3.5–5.1)
Sodium: 133 mmol/L — ABNORMAL LOW (ref 135–145)

## 2023-03-13 LAB — CBC
HCT: 31.1 % — ABNORMAL LOW (ref 39.0–52.0)
Hemoglobin: 10.7 g/dL — ABNORMAL LOW (ref 13.0–17.0)
MCH: 29.6 pg (ref 26.0–34.0)
MCHC: 34.4 g/dL (ref 30.0–36.0)
MCV: 86.1 fL (ref 80.0–100.0)
Platelets: 355 10*3/uL (ref 150–400)
RBC: 3.61 MIL/uL — ABNORMAL LOW (ref 4.22–5.81)
RDW: 12 % (ref 11.5–15.5)
WBC: 6.1 10*3/uL (ref 4.0–10.5)
nRBC: 0 % (ref 0.0–0.2)

## 2023-03-13 MED ORDER — INSULIN GLARGINE-YFGN 100 UNIT/ML ~~LOC~~ SOLN
28.0000 [IU] | Freq: Every day | SUBCUTANEOUS | Status: DC
  Administered 2023-03-13 – 2023-03-16 (×4): 28 [IU] via SUBCUTANEOUS
  Filled 2023-03-13 (×4): qty 0.28

## 2023-03-13 MED ORDER — INSULIN ASPART 100 UNIT/ML IJ SOLN
5.0000 [IU] | Freq: Three times a day (TID) | INTRAMUSCULAR | Status: DC
  Administered 2023-03-13 – 2023-03-16 (×9): 5 [IU] via SUBCUTANEOUS

## 2023-03-13 NOTE — Progress Notes (Signed)
Pharmacy Antibiotic Note  Jon House is a 52 y.o. male admitted on 03/10/2023 with left foot infection/osteomyelitis.  Pharmacy has been consulted for vancomycin & cefepime dosing.  Serum creatinine improved. Adjust vanc dosing  Vancomycin 1000 mg IV Q 12 hrs. Goal AUC 400-550. Expected AUC: 492 SCr used: 0.8   Plan: Continue Vancomycin 1000 mg IV q12hr Continue Cefepime 2g IV q8h Continue Flagyl 500mg  IV q12h per MD F/u renal function, WBC, temp, culture data Plan for transtibial amputation in OR 11/13  Height: 5\' 6"  (167.6 cm) Weight: 67.6 kg (149 lb) IBW/kg (Calculated) : 63.8  Temp (24hrs), Avg:99 F (37.2 C), Min:98.3 F (36.8 C), Max:100 F (37.8 C)  Recent Labs  Lab 03/10/23 1426 03/11/23 0602 03/12/23 0720 03/13/23 0636  WBC 12.4* 7.7 6.0 6.1  CREATININE 1.19 0.78 0.68 0.72    Estimated Creatinine Clearance: 95.3 mL/min (by C-G formula based on SCr of 0.72 mg/dL).    Allergies  Allergen Reactions   Bee Venom Hives   Metformin And Related Diarrhea   Antimicrobials this admission:  11/9 Vanc 1500 x 1 11/10 Vanc 750 x 1, then 1000 q12hr >> 11/9 Zosyn x 1 11/9 flagyl >> 11/9 Cefepime>>  Dose adjustments this admission:   Microbiology results:  11/9 BCx2 : ngtd x3d 11/10 St aureus PCR +, MRSA PCR -  Thank you for allowing pharmacy to be a part of this patient's care.  Link Snuffer, PharmD, BCPS, BCCCP Please refer to [facility] for [facility] Pharm[facility] numbers 03/13/2023, 10:56 AM

## 2023-03-13 NOTE — Progress Notes (Signed)
PROGRESS NOTE    Manson Zahler  UYQ:[id number] DOB: 02/21/1971 DOA: 03/10/2023 PCP: Patient, No Pcp Per    Brief Narrative:   Jon House is a 52 y.o. male with past medical history significant for DM2, history of osteomyelitis/recurrent diabetic foot infections s/p right/left TMA with recent revision in April 2024 who presented to [facility] ED on 11/9 from home via EMS due to progressive left foot wound with foul odor/drainage, fever, nausea/vomiting. Patient reports that the left foot has continued to worsen with increasing pain, drainage, and odor despite just completing a course of doxycycline. He has developed fever and general malaise with nausea and nonbloody vomiting.   In the ED, temperature 98.7 F, HR 107, RR 20, BP 99/59, SpO2 95% on room air.  WBC 12.4, hemoglobin 9.4, platelet count 303.  Sodium 135, potassium 3.8, chloride 102, CO2 25, glucose 149, BUN 28, creat 1.19.  AST 11, ALT 14, total bilirubin 0.4.  High sensitive troponin 9 followed by 7.  Urinalysis with greater than 500 glucose, otherwise unrevealing.  MRSA PCR negative.  MSSA PCR positive.  Chest x-ray with mild bibasilar atelectasis/airspace disease.  Left foot x-ray with osseous demineralization of the fifth metatarsal with surrounding soft tissue swelling, concerning for osteomyelitis.  Assessment & Plan:    Acute left foot osteomyelitis Patient presenting to the ED with progressive left foot wound with foul odor, drainage associate with fever, nausea/vomiting over the last several days.  Previous history of diabetic foot infections and osteomyelitis s/p TMA.  On arrival, patient has been afebrile with elevated WBC count of 12.4. -- Orthopedics following, appreciate assistance -- MRI left foot 11/6: still pending Rad read as of 11/12?? -- Tylenol 650 mg p.o. every 6 hours as needed mild pain -- Oxycodone 5 mg p.o. every 4 hours.  Moderate pain -- Dilaudid 0.5 mg IV every 4 hours as needed severe pain --  Vancomycin, pharmacy consulted for dosing/monitoring -- Cefepime 2 g IV every 8 hours -- Metronidazole 500 mg IV every 12 hours -- Orthopedics plans left transtibial amputation tomorrow, n.p.o. after midnight -- CBC daily  Type 2 diabetes mellitus, with hyperglycemia Hemoglobin A1c 10.3, poorly controlled.  Home regimen includes NPH 70-3010-20 units twice daily -- Diabetic educator consult -- Semglee 28 units [place] daily -- Novolog 5u TIDAC -- SSI for coverage -- CBGs qAC/HS  DVT prophylaxis: heparin injection 5,000 Units Start: 03/10/23 1700    Code Status: Full Code Family Communication: No family present at bedside this morning  Disposition Plan:  Level of care: Med-Surg Status is: Inpatient Remains inpatient appropriate because: Pending left transtibial amputation tomorrow    Consultants:  Orthopedics, Dr. Magnus Ivan, Dr. Lajoyce Corners  Procedures:  None  Antimicrobials:  Vancomycin 11/9>> Cefepime 11/9>> Metronidazole 11/9>>   Subjective: Patient seen examined bedside, resting calmly.  Lying in bed.  No complaints this morning.  Pending left BKA planned for tomorrow.  Continues on IV antibiotics.  Currently reports pain controlled.  Remains afebrile.  No other questions or concerns at this time.  Denies headache, no dizziness, no chest pain, no palpitations, no shortness of breath, no abdominal pain, no fever/chills/night sweats, no nausea/vomiting/diarrhea, no focal weakness, no fatigue, no paresthesia.  New acute events overnight per nursing staff.  Objective: Vitals:   03/12/23 1451 03/12/23 2051 03/13/23 0531 03/13/23 0749  BP: (!) 143/75 130/71 126/72 128/69  Pulse: 100 91 87 92  Resp: 18 15 16 17   Temp: 100 F (37.8 C) 99.2 F (37.3 C) 98.4 F (36.9 C)  98.3 F (36.8 C)  TempSrc: Oral Oral Oral Oral  SpO2: 98% 97% 97% 99%  Weight:      Height:        Intake/Output Summary (Last 24 hours) at 03/13/2023 1202 Last data filed at 03/13/2023 2130 Gross per 24 hour   Intake 1163.5 ml  Output 600 ml  Net 563.5 ml   Filed Weights   03/10/23 1116  Weight: 67.6 kg    Examination:  Physical Exam: GEN: NAD, alert and oriented x 3, wd/wn HEENT: NCAT, PERRL, EOMI, sclera clear, MMM PULM: CTAB w/o wheezes/crackles, normal respiratory effort, on room air CV: RRR w/o M/G/R GI: abd soft, NTND, NABS, no R/G/M MSK: + edema left foot/ankle, muscle strength globally intact 5/5 bilateral upper/lower extremities NEURO: CN II-XII intact, no focal deficits, sensation to light touch intact PSYCH: normal mood/affect Integumentary: left foot stump noted with ulcerations, foul-smelling with purulent discharge from wound site       Data Reviewed: I have personally reviewed following labs and imaging studies  CBC: Recent Labs  Lab 03/10/23 1426 03/11/23 0602 03/12/23 0720 03/13/23 0636  WBC 12.4* 7.7 6.0 6.1  NEUTROABS 10.6*  --   --   --   HGB 9.4* 9.7* 10.4* 10.7*  HCT 27.6* 29.4* 31.4* 31.1*  MCV 88.2 87.0 87.0 86.1  PLT 303 313 364 355   Basic Metabolic Panel: Recent Labs  Lab 03/10/23 1426 03/11/23 0602 03/12/23 0720 03/13/23 0636  NA 135 135 132* 133*  K 3.8 3.8 4.2 4.2  CL 102 102 99 97*  CO2 25 23 24 24   GLUCOSE 149* 228* 262* 251*  BUN 28* 19 15 18   CREATININE 1.19 0.78 0.68 0.72  CALCIUM 8.5* 8.5* 8.6* 8.7*   GFR: Estimated Creatinine Clearance: 95.3 mL/min (by C-G formula based on SCr of 0.72 mg/dL). Liver Function Tests: Recent Labs  Lab 03/10/23 1426  AST 11*  ALT 14  ALKPHOS 81  BILITOT 0.4  PROT 7.6  ALBUMIN 3.0*   No results for input(s): "LIPASE", "AMYLASE" in the last 168 hours. No results for input(s): "AMMONIA" in the last 168 hours. Coagulation Profile: No results for input(s): "INR", "PROTIME" in the last 168 hours. Cardiac Enzymes: No results for input(s): "CKTOTAL", "CKMB", "CKMBINDEX", "TROPONINI" in the last 168 hours. BNP (last 3 results) No results for input(s): "PROBNP" in the last 8760  hours. HbA1C: Recent Labs    03/11/23 0602  HGBA1C 10.3*   CBG: Recent Labs  Lab 03/12/23 1210 03/12/23 1659 03/12/23 2135 03/13/23 0751 03/13/23 1143  GLUCAP 236* 134* 271* 294* 320*   Lipid Profile: No results for input(s): "CHOL", "HDL", "LDLCALC", "TRIG", "CHOLHDL", "LDLDIRECT" in the last 72 hours. Thyroid Function Tests: No results for input(s): "TSH", "T4TOTAL", "FREET4", "T3FREE", "THYROIDAB" in the last 72 hours. Anemia Panel: Recent Labs    03/11/23 0602  VITAMINB12 1,871*  FOLATE 15.8  FERRITIN 311  TIBC 206*  IRON 20*  RETICCTPCT 1.1   Sepsis Labs: No results for input(s): "PROCALCITON", "LATICACIDVEN" in the last 168 hours.  Recent Results (from the past 240 hour(s))  Blood culture (routine x 2)     Status: None (Preliminary result)   Collection Time: 03/10/23  4:03 PM   Specimen: BLOOD  Result Value Ref Range Status   Specimen Description   Final    BLOOD LEFT ANTECUBITAL Performed at [facility] [address], [place], [place] [address]    Special Requests   Final    BOTTLES DRAWN AEROBIC  AND ANAEROBIC Blood Culture adequate volume Performed at [facility], [address], [place], [place] [address]    Culture   Final    NO GROWTH 3 DAYS Performed at [facility], [address], [place], [place] [address]    Report Status PENDING  Incomplete  Blood culture (routine x 2)     Status: None (Preliminary result)   Collection Time: 03/10/23  4:05 PM   Specimen: BLOOD  Result Value Ref Range Status   Specimen Description   Final    BLOOD RIGHT ANTECUBITAL Performed at [facility], [address] [place], [place] [address]    Special Requests   Final    BOTTLES DRAWN AEROBIC AND ANAEROBIC Blood Culture adequate volume Performed at [facility], [address] [place], [place] [address]    Culture   Final    NO GROWTH 3 DAYS Performed at [facility], [address], [place], [place] [address]    Report Status PENDING  Incomplete  Surgical PCR screen     Status: Abnormal   Collection Time: 03/11/23 12:33 AM   Specimen: Nasal Mucosa; Nasal Swab  Result Value Ref Range Status   MRSA, PCR NEGATIVE NEGATIVE Final   Staphylococcus aureus POSITIVE (A) NEGATIVE Final    Comment: (NOTE) The Xpert SA Assay (FDA approved for NASAL specimens in patients 51 years of age and older), is one component of a comprehensive surveillance program. It is not intended to diagnose infection nor to guide or monitor treatment. Performed at [facility], [address], [place], [place] [address]          Radiology Studies: VAS [place] ABI WITH/WO TBI  Result Date: 03/12/2023  LOWER EXTREMITY DOPPLER STUDY Patient Name:  FAVION ROSENCRANCE  Date of Exam:   03/11/2023 Medical Rec #: [id number]             Accession #:    [id number] Date of Birth: 01-08-1971             Patient Gender: M Patient Age:   38 years Exam Location:  [facility] Procedure:      VAS [place] ABI WITH/WO TBI Referring Phys: TIMOTHY OPYD --------------------------------------------------------------------------------  Indications: DM foot infection High Risk Factors: Diabetes, past history of smoking.  Vascular Interventions: LLE TMA, RLE great toe amputation. Comparison Study: Previous exam 07/29/2018 Performing Technologist: Ernestene Mention RVT/RDMS  Examination Guidelines: A complete evaluation includes at minimum, Doppler waveform signals and systolic blood pressure reading at the level of bilateral brachial, anterior tibial, and posterior tibial arteries, when vessel segments are accessible. Bilateral testing is considered an integral part of a complete examination. Photoelectric Plethysmograph (PPG) waveforms and toe systolic pressure readings are included as required and additional duplex testing as needed. Limited examinations for reoccurring indications may be performed as  noted.  ABI Findings: +--------+------------------+-----+---------+--------+ Right   Rt Pressure (mmHg)IndexWaveform Comment  +--------+------------------+-----+---------+--------+ [id number]                    triphasic         +--------+------------------+-----+---------+--------+ PTA     169               1.26 biphasic          +--------+------------------+-----+---------+--------+ DP      181               1.35 triphasic         +--------+------------------+-----+---------+--------+ +--------+------------------+-----+---------+-------+  Left    Lt Pressure (mmHg)IndexWaveform Comment +--------+------------------+-----+---------+-------+ [id number]                    triphasic        +--------+------------------+-----+---------+-------+ PTA     183               1.37 triphasic        +--------+------------------+-----+---------+-------+ DP      187               1.40 triphasic        +--------+------------------+-----+---------+-------+ +-------+-----------+-----------+------------+------------+ ABI/TBIToday's ABIToday's TBIPrevious ABIPrevious TBI +-------+-----------+-----------+------------+------------+ Right  1.35       amp        1.18                     +-------+-----------+-----------+------------+------------+ Left   1.37       amp        1.21                     +-------+-----------+-----------+------------+------------+  Arterial wall calcification precludes accurate ankle pressures and ABIs.  Summary: Right: Resting right ankle-brachial index indicates noncompressible right lower extremity arteries. Left: Resting left ankle-brachial index indicates noncompressible left lower extremity arteries. *See table(s) above for measurements and observations.  Electronically signed by Carolynn Sayers on 03/12/2023 at 4:35:43 PM.    Final         Scheduled Meds:  feeding supplement  237 mL Oral Q breakfast   heparin  5,000 Units Subcutaneous  Q8H   influenza vac split trivalent PF  0.5 mL Intramuscular Tomorrow-1000   insulin aspart  0-5 Units Subcutaneous QHS   insulin aspart  0-9 Units Subcutaneous TID WC   insulin aspart  5 Units Subcutaneous TID WC   insulin glargine-yfgn  28 Units Subcutaneous Daily   metroNIDAZOLE  500 mg Oral Q12H   multivitamin with minerals  1 tablet Oral Daily   mupirocin ointment  1 Application Nasal BID   nutrition supplement (JUVEN)  1 packet Oral BID BM   Continuous Infusions:  ceFEPime (MAXIPIME) IV Stopped (03/13/23 0556)   vancomycin 200 mL/hr at 03/13/23 0622     LOS: 3 days    Time spent: 52 minutes spent on chart review, discussion with nursing staff, consultants, updating family and interview/physical exam; more than 50% of that time was spent in counseling and/or coordination of care.    Alvira Philips [place], DO [facility] Available via Epic secure chat 7am-7pm After these hours, please refer to coverage provider listed on [email or web address] 03/13/2023, 12:02 PM

## 2023-03-13 NOTE — Plan of Care (Signed)
  Problem: Coping: Goal: Ability to adjust to condition or change in health will improve Outcome: Progressing   Problem: Fluid Volume: Goal: Ability to maintain a balanced intake and output will improve Outcome: Progressing   Problem: Metabolic: Goal: Ability to maintain appropriate glucose levels will improve Outcome: Progressing   Problem: Nutritional: Goal: Maintenance of adequate nutrition will improve Outcome: Progressing   Problem: Skin Integrity: Goal: Risk for impaired skin integrity will decrease Outcome: Progressing   Problem: Education: Goal: Knowledge of General Education information will improve Description: Including pain rating scale, medication(s)/side effects and non-pharmacologic comfort measures Outcome: Progressing   Problem: Health Behavior/Discharge Planning: Goal: Ability to manage health-related needs will improve Outcome: Progressing   Problem: Nutrition: Goal: Adequate nutrition will be maintained Outcome: Progressing   Problem: Elimination: Goal: Will not experience complications related to bowel motility Outcome: Progressing Goal: Will not experience complications related to urinary retention Outcome: Progressing   Problem: Pain Management: Goal: General experience of comfort will improve Outcome: Progressing   Problem: Safety: Goal: Ability to remain free from injury will improve Outcome: Progressing

## 2023-03-13 NOTE — Plan of Care (Signed)
  Problem: Coping: Goal: Ability to adjust to condition or change in health will improve Outcome: Progressing   Problem: Nutritional: Goal: Maintenance of adequate nutrition will improve Outcome: Progressing   Problem: Skin Integrity: Goal: Risk for impaired skin integrity will decrease Outcome: Progressing   Problem: Activity: Goal: Risk for activity intolerance will decrease Outcome: Progressing   Problem: Coping: Goal: Level of anxiety will decrease Outcome: Progressing   Problem: Safety: Goal: Ability to remain free from injury will improve Outcome: Progressing   Problem: Skin Integrity: Goal: Risk for impaired skin integrity will decrease Outcome: Progressing   Problem: Metabolic: Goal: Ability to maintain appropriate glucose levels will improve Outcome: Not Progressing

## 2023-03-14 ENCOUNTER — Other Ambulatory Visit: Payer: Self-pay

## 2023-03-14 ENCOUNTER — Encounter (HOSPITAL_COMMUNITY)
Admission: EM | Disposition: A | Discharge status: Home / Self Care | Payer: Self-pay | Source: Home / Self Care | Attending: Internal Medicine

## 2023-03-14 ENCOUNTER — Inpatient Hospital Stay (HOSPITAL_COMMUNITY): Payer: MEDICAID | Admitting: Anesthesiology

## 2023-03-14 ENCOUNTER — Encounter (HOSPITAL_COMMUNITY): Payer: Self-pay | Admitting: Family Medicine

## 2023-03-14 DIAGNOSIS — M869 Osteomyelitis, unspecified: Secondary | ICD-10-CM

## 2023-03-14 DIAGNOSIS — E1169 Type 2 diabetes mellitus with other specified complication: Secondary | ICD-10-CM

## 2023-03-14 HISTORY — PX: AMPUTATION: SHX166

## 2023-03-14 HISTORY — PX: APPLICATION OF WOUND VAC: SHX5189

## 2023-03-14 LAB — CBC
HCT: 34.2 % — ABNORMAL LOW (ref 39.0–52.0)
Hemoglobin: 11.2 g/dL — ABNORMAL LOW (ref 13.0–17.0)
MCH: 28.3 pg (ref 26.0–34.0)
MCHC: 32.7 g/dL (ref 30.0–36.0)
MCV: 86.4 fL (ref 80.0–100.0)
Platelets: 392 10*3/uL (ref 150–400)
RBC: 3.96 MIL/uL — ABNORMAL LOW (ref 4.22–5.81)
RDW: 11.9 % (ref 11.5–15.5)
WBC: 6.9 10*3/uL (ref 4.0–10.5)
nRBC: 0 % (ref 0.0–0.2)

## 2023-03-14 LAB — BASIC METABOLIC PANEL
Anion gap: 9 (ref 5–15)
BUN: 23 mg/dL — ABNORMAL HIGH (ref 6–20)
CO2: 27 mmol/L (ref 22–32)
Calcium: 9.2 mg/dL (ref 8.9–10.3)
Chloride: 98 mmol/L (ref 98–111)
Creatinine, Ser: 0.74 mg/dL (ref 0.61–1.24)
GFR, Estimated: >60 mL/min (ref >60)
Glucose, Bld: 202 mg/dL — ABNORMAL HIGH (ref 70–99)
Potassium: 4 mmol/L (ref 3.5–5.1)
Sodium: 134 mmol/L — ABNORMAL LOW (ref 135–145)

## 2023-03-14 LAB — GLUCOSE, CAPILLARY
Glucose-Capillary: 242 mg/dL — ABNORMAL HIGH (ref 70–99)
Glucose-Capillary: 217 mg/dL — ABNORMAL HIGH (ref 70–99)
Glucose-Capillary: 165 mg/dL — ABNORMAL HIGH (ref 70–99)
Glucose-Capillary: 191 mg/dL — ABNORMAL HIGH (ref 70–99)
Glucose-Capillary: 302 mg/dL — ABNORMAL HIGH (ref 70–99)

## 2023-03-14 LAB — PREALBUMIN: Prealbumin: 8 mg/dL — ABNORMAL LOW (ref 18–38)

## 2023-03-14 LAB — HEMOGLOBIN A1C
Hgb A1c MFr Bld: 10.6 % — ABNORMAL HIGH (ref 4.8–5.6)
Mean Plasma Glucose: 257.52 mg/dL

## 2023-03-14 SURGERY — AMPUTATION BELOW KNEE
Anesthesia: General | Site: Knee | Laterality: Left

## 2023-03-14 MED ORDER — FENTANYL CITRATE (PF) 250 MCG/5ML IJ SOLN
INTRAMUSCULAR | Status: AC
  Filled 2023-03-14: qty 5

## 2023-03-14 MED ORDER — TRANEXAMIC ACID-NACL 1000-0.7 MG/100ML-% IV SOLN
INTRAVENOUS | Status: AC
  Filled 2023-03-14: qty 100

## 2023-03-14 MED ORDER — ROPIVACAINE HCL 5 MG/ML IJ SOLN
INTRAMUSCULAR | Status: DC | PRN
  Administered 2023-03-14: 30 mL via PERINEURAL

## 2023-03-14 MED ORDER — SODIUM CHLORIDE 0.9 % IV SOLN: INTRAVENOUS | Status: DC

## 2023-03-14 MED ORDER — CEFAZOLIN SODIUM-DEXTROSE 2-4 GM/100ML-% IV SOLN
INTRAVENOUS | Status: AC
  Administered 2023-03-14: 2 g via INTRAVENOUS
  Filled 2023-03-14: qty 100

## 2023-03-14 MED ORDER — LACTATED RINGERS IV SOLN: INTRAVENOUS | Status: DC

## 2023-03-14 MED ORDER — VITAMIN C 500 MG PO TABS
1000.0000 mg | ORAL_TABLET | Freq: Every day | ORAL | Status: DC
  Administered 2023-03-14 – 2023-03-16 (×3): 1000 mg via ORAL
  Filled 2023-03-14 (×3): qty 2

## 2023-03-14 MED ORDER — INSULIN ASPART 100 UNIT/ML IJ SOLN: 0.0000 [IU] | INTRAMUSCULAR | Status: DC | PRN

## 2023-03-14 MED ORDER — CHLORHEXIDINE GLUCONATE 4 % EX SOLN: 60.0000 mL | Freq: Once | CUTANEOUS | Status: DC

## 2023-03-14 MED ORDER — PHENOL 1.4 % MT LIQD: 1.0000 | OROMUCOSAL | Status: DC | PRN

## 2023-03-14 MED ORDER — CHLORHEXIDINE GLUCONATE 0.12 % MT SOLN: 15.0000 mL | Freq: Once | OROMUCOSAL | Status: AC

## 2023-03-14 MED ORDER — ACETAMINOPHEN 325 MG PO TABS
325.0000 mg | ORAL_TABLET | Freq: Four times a day (QID) | ORAL | Status: DC | PRN
Start: 2023-03-15 — End: 2023-03-15

## 2023-03-14 MED ORDER — LIDOCAINE 2% (20 MG/ML) 5 ML SYRINGE
INTRAMUSCULAR | Status: DC | PRN
  Administered 2023-03-14: 100 mg via INTRAVENOUS

## 2023-03-14 MED ORDER — PROPOFOL 500 MG/50ML IV EMUL: INTRAVENOUS | Status: DC | PRN

## 2023-03-14 MED ORDER — CEFAZOLIN SODIUM-DEXTROSE 2-4 GM/100ML-% IV SOLN
2.0000 g | INTRAVENOUS | Status: AC
  Administered 2023-03-14: 2 g via INTRAVENOUS

## 2023-03-14 MED ORDER — PANTOPRAZOLE SODIUM 40 MG PO TBEC
40.0000 mg | DELAYED_RELEASE_TABLET | Freq: Every day | ORAL | Status: DC
  Administered 2023-03-14 – 2023-03-16 (×3): 40 mg via ORAL
  Filled 2023-03-14 (×3): qty 1

## 2023-03-14 MED ORDER — DEXAMETHASONE SODIUM PHOSPHATE 10 MG/ML IJ SOLN
INTRAMUSCULAR | Status: DC | PRN
  Administered 2023-03-14: 10 mg

## 2023-03-14 MED ORDER — FENTANYL CITRATE (PF) 100 MCG/2ML IJ SOLN
INTRAMUSCULAR | Status: AC
  Administered 2023-03-14: 50 ug
  Filled 2023-03-14: qty 2

## 2023-03-14 MED ORDER — MIDAZOLAM HCL 2 MG/2ML IJ SOLN
INTRAMUSCULAR | Status: AC
  Administered 2023-03-14: 2 mg
  Filled 2023-03-14: qty 2

## 2023-03-14 MED ORDER — TRANEXAMIC ACID 1000 MG/10ML IV SOLN
2000.0000 mg | INTRAVENOUS | Status: DC
  Filled 2023-03-14: qty 20

## 2023-03-14 MED ORDER — ORAL CARE MOUTH RINSE: 15.0000 mL | Freq: Once | OROMUCOSAL | Status: AC

## 2023-03-14 MED ORDER — OXYCODONE HCL 5 MG PO TABS
5.0000 mg | ORAL_TABLET | ORAL | Status: DC | PRN
  Administered 2023-03-15: 10 mg via ORAL
  Filled 2023-03-14: qty 2

## 2023-03-14 MED ORDER — PROPOFOL 10 MG/ML IV BOLUS
INTRAVENOUS | Status: DC | PRN
  Administered 2023-03-14: 150 mg via INTRAVENOUS

## 2023-03-14 MED ORDER — FENTANYL CITRATE (PF) 250 MCG/5ML IJ SOLN
INTRAMUSCULAR | Status: DC | PRN
  Administered 2023-03-14: 25 ug via INTRAVENOUS

## 2023-03-14 MED ORDER — OXYCODONE HCL 5 MG PO TABS
10.0000 mg | ORAL_TABLET | ORAL | Status: DC | PRN
  Administered 2023-03-15 – 2023-03-16 (×5): 15 mg via ORAL
  Filled 2023-03-14 (×5): qty 3

## 2023-03-14 MED ORDER — PROPOFOL 1000 MG/100ML IV EMUL
INTRAVENOUS | Status: AC
  Filled 2023-03-14: qty 100

## 2023-03-14 MED ORDER — ALUM & MAG HYDROXIDE-SIMETH 200-200-20 MG/5ML PO SUSP: 15.0000 mL | ORAL | Status: DC | PRN

## 2023-03-14 MED ORDER — MIDAZOLAM HCL 2 MG/2ML IJ SOLN: 2.0000 mg | Freq: Once | INTRAMUSCULAR | Status: DC

## 2023-03-14 MED ORDER — LIDOCAINE 2% (20 MG/ML) 5 ML SYRINGE
INTRAMUSCULAR | Status: AC
  Filled 2023-03-14: qty 5

## 2023-03-14 MED ORDER — JUVEN PO PACK
1.0000 | PACK | Freq: Two times a day (BID) | ORAL | Status: DC
  Administered 2023-03-14 – 2023-03-16 (×4): 1 via ORAL
  Filled 2023-03-14 (×4): qty 1

## 2023-03-14 MED ORDER — FENTANYL CITRATE (PF) 100 MCG/2ML IJ SOLN: 50.0000 ug | Freq: Once | INTRAMUSCULAR | Status: DC

## 2023-03-14 MED ORDER — CEFAZOLIN SODIUM-DEXTROSE 2-4 GM/100ML-% IV SOLN
2.0000 g | Freq: Three times a day (TID) | INTRAVENOUS | Status: AC
  Administered 2023-03-15: 2 g via INTRAVENOUS
  Filled 2023-03-14 (×2): qty 100

## 2023-03-14 MED ORDER — PHENYLEPHRINE HCL (PRESSORS) 10 MG/ML IV SOLN
INTRAVENOUS | Status: DC | PRN
  Administered 2023-03-14: 160 ug via INTRAVENOUS
  Administered 2023-03-14: 80 ug via INTRAVENOUS
  Administered 2023-03-14 (×2): 160 ug via INTRAVENOUS
  Administered 2023-03-14 (×2): 80 ug via INTRAVENOUS
  Administered 2023-03-14: 240 ug via INTRAVENOUS

## 2023-03-14 MED ORDER — 0.9 % SODIUM CHLORIDE (POUR BTL) OPTIME
TOPICAL | Status: DC | PRN
  Administered 2023-03-14: 1000 mL

## 2023-03-14 MED ORDER — DEXAMETHASONE SODIUM PHOSPHATE 10 MG/ML IJ SOLN
INTRAMUSCULAR | Status: AC
  Filled 2023-03-14: qty 1

## 2023-03-14 MED ORDER — DEXAMETHASONE SODIUM PHOSPHATE 10 MG/ML IJ SOLN
INTRAMUSCULAR | Status: DC | PRN
  Administered 2023-03-14: 5 mg via INTRAVENOUS

## 2023-03-14 MED ORDER — GUAIFENESIN-DM 100-10 MG/5ML PO SYRP: 15.0000 mL | ORAL_SOLUTION | ORAL | Status: DC | PRN

## 2023-03-14 MED ORDER — HYDROMORPHONE HCL 1 MG/ML IJ SOLN
0.5000 mg | INTRAMUSCULAR | Status: DC | PRN
  Administered 2023-03-15 – 2023-03-16 (×4): 1 mg via INTRAVENOUS
  Filled 2023-03-14 (×4): qty 1

## 2023-03-14 MED ORDER — LABETALOL HCL 5 MG/ML IV SOLN: 10.0000 mg | INTRAVENOUS | Status: DC | PRN

## 2023-03-14 MED ORDER — CHLORHEXIDINE GLUCONATE 0.12 % MT SOLN
OROMUCOSAL | Status: AC
  Administered 2023-03-14: 15 mL via OROMUCOSAL
  Filled 2023-03-14: qty 15

## 2023-03-14 MED ORDER — MAGNESIUM SULFATE 2 GM/50ML IV SOLN: 2.0000 g | Freq: Every day | INTRAVENOUS | Status: DC | PRN

## 2023-03-14 MED ORDER — POVIDONE-IODINE 10 % EX SWAB
2.0000 | Freq: Once | CUTANEOUS | Status: AC
  Administered 2023-03-14: 2 via TOPICAL

## 2023-03-14 MED ORDER — TRANEXAMIC ACID-NACL 1000-0.7 MG/100ML-% IV SOLN
1000.0000 mg | INTRAVENOUS | Status: AC
  Administered 2023-03-14: 1000 mg via INTRAVENOUS

## 2023-03-14 MED ORDER — MIDAZOLAM HCL 2 MG/2ML IJ SOLN
INTRAMUSCULAR | Status: AC
  Filled 2023-03-14: qty 2

## 2023-03-14 MED ORDER — ONDANSETRON HCL 4 MG/2ML IJ SOLN
INTRAMUSCULAR | Status: DC | PRN
  Administered 2023-03-14: 4 mg via INTRAVENOUS

## 2023-03-14 MED ORDER — CLONIDINE HCL (ANALGESIA) 100 MCG/ML EP SOLN
EPIDURAL | Status: DC | PRN
  Administered 2023-03-14: 100 ug

## 2023-03-14 MED ORDER — MIDAZOLAM HCL 5 MG/5ML IJ SOLN
INTRAMUSCULAR | Status: DC | PRN
  Administered 2023-03-14: 1 mg via INTRAVENOUS

## 2023-03-14 MED ORDER — INSULIN ASPART 100 UNIT/ML IJ SOLN
INTRAMUSCULAR | Status: AC
  Administered 2023-03-14: 4 [IU] via SUBCUTANEOUS
  Filled 2023-03-14: qty 1

## 2023-03-14 MED ORDER — ONDANSETRON HCL 4 MG/2ML IJ SOLN
INTRAMUSCULAR | Status: AC
  Filled 2023-03-14: qty 2

## 2023-03-14 MED ORDER — POTASSIUM CHLORIDE CRYS ER 20 MEQ PO TBCR: 20.0000 meq | EXTENDED_RELEASE_TABLET | Freq: Every day | ORAL | Status: DC | PRN

## 2023-03-14 MED ORDER — HYDRALAZINE HCL 20 MG/ML IJ SOLN: 5.0000 mg | INTRAMUSCULAR | Status: DC | PRN

## 2023-03-14 MED ORDER — ZINC SULFATE 220 (50 ZN) MG PO CAPS
220.0000 mg | ORAL_CAPSULE | Freq: Every day | ORAL | Status: DC
  Administered 2023-03-14 – 2023-03-16 (×3): 220 mg via ORAL
  Filled 2023-03-14 (×3): qty 1

## 2023-03-14 MED ORDER — PHENYLEPHRINE 80 MCG/ML (10ML) SYRINGE FOR IV PUSH (FOR BLOOD PRESSURE SUPPORT)
PREFILLED_SYRINGE | INTRAVENOUS | Status: AC
  Filled 2023-03-14: qty 10

## 2023-03-14 MED ORDER — METOPROLOL TARTRATE 5 MG/5ML IV SOLN: 2.0000 mg | INTRAVENOUS | Status: DC | PRN

## 2023-03-14 SURGICAL SUPPLY — 38 items
BAG COUNTER SPONGE SURGICOUNT (BAG) IMPLANT
BLADE SAW RECIP 87.9 MT (BLADE) ×1 IMPLANT
BLADE SURG 21 STRL SS (BLADE) ×1 IMPLANT
BNDG COHESIVE 6X5 TAN ST LF (GAUZE/BANDAGES/DRESSINGS) IMPLANT
CANISTER WOUND CARE 500ML ATS (WOUND CARE) ×1 IMPLANT
COVER SURGICAL LIGHT HANDLE (MISCELLANEOUS) ×1 IMPLANT
CUFF TOURN SGL QUICK 34 (TOURNIQUET CUFF) ×1
CUFF TRNQT CYL 34X4.125X (TOURNIQUET CUFF) ×1 IMPLANT
DRAPE DERMATAC (DRAPES) IMPLANT
DRAPE INCISE IOBAN 66X45 STRL (DRAPES) ×1 IMPLANT
DRAPE U-SHAPE 47X51 STRL (DRAPES) ×1 IMPLANT
DRESSING PREVENA PLUS CUSTOM (GAUZE/BANDAGES/DRESSINGS) ×1 IMPLANT
DRSG PREVENA PLUS CUSTOM (GAUZE/BANDAGES/DRESSINGS) ×1 IMPLANT
DURAPREP 26ML APPLICATOR (WOUND CARE) ×1 IMPLANT
ELECT REM PT RETURN 9FT ADLT (ELECTROSURGICAL) ×1 IMPLANT
ELECTRODE REM PT RTRN 9FT ADLT (ELECTROSURGICAL) ×1 IMPLANT
GLOVE BIOGEL PI IND STRL 9 (GLOVE) ×1 IMPLANT
GLOVE SURG ORTHO 9.0 STRL STRW (GLOVE) ×1 IMPLANT
GOWN STRL REUS W/ TWL XL LVL3 (GOWN DISPOSABLE) ×2 IMPLANT
GOWN STRL REUS W/TWL XL LVL3 (GOWN DISPOSABLE) ×2
GRAFT SKIN WND MICRO 38 (Tissue) IMPLANT
KIT BASIN OR (CUSTOM PROCEDURE TRAY) ×1 IMPLANT
KIT TURNOVER KIT B (KITS) ×1 IMPLANT
MANIFOLD NEPTUNE II (INSTRUMENTS) ×1 IMPLANT
NS IRRIG 1000ML POUR BTL (IV SOLUTION) ×1 IMPLANT
PACK ORTHO EXTREMITY (CUSTOM PROCEDURE TRAY) ×1 IMPLANT
PAD ARMBOARD 7.5X6 YLW CONV (MISCELLANEOUS) ×1 IMPLANT
PREVENA RESTOR ARTHOFORM 46X30 (CANNISTER) ×1 IMPLANT
SPONGE T-LAP 18X18 ~~LOC~~+RFID (SPONGE) IMPLANT
STAPLER VISISTAT 35W (STAPLE) IMPLANT
STOCKINETTE IMPERVIOUS LG (DRAPES) ×1 IMPLANT
SUT ETHILON 2 0 PSLX (SUTURE) IMPLANT
SUT SILK 2 0 (SUTURE) ×1
SUT SILK 2-0 18XBRD TIE 12 (SUTURE) ×1 IMPLANT
SUT VIC AB 1 CTX 27 (SUTURE) ×2 IMPLANT
TOWEL GREEN STERILE (TOWEL DISPOSABLE) ×1 IMPLANT
TUBE CONNECTING 12X1/4 (SUCTIONS) ×1 IMPLANT
YANKAUER SUCT BULB TIP NO VENT (SUCTIONS) ×1 IMPLANT

## 2023-03-14 NOTE — Anesthesia Procedure Notes (Signed)
Anesthesia Regional Block: Popliteal block   Pre-Anesthetic Checklist: , timeout performed,  Correct Patient, Correct Site, Correct Laterality,  Correct Procedure, Correct Position, site marked,  Risks and benefits discussed,  Surgical consent,  Pre-op evaluation,  At surgeon's request and post-op pain management  Laterality: Left  Prep: Maximum Sterile Barrier Precautions used, chloraprep       Needles:  Injection technique: Single-shot  Needle Type: Echogenic Needle      Needle Gauge: 20     Additional Needles:   Procedures:,,,, ultrasound used (permanent image in chart),,    Narrative:  Start time: 03/14/2023 12:15 PM End time: 03/14/2023 12:18 PM Injection made incrementally with aspirations every 5 mL.  Performed by: Personally  Anesthesiologist: Mariann Barter, MD

## 2023-03-14 NOTE — Anesthesia Preprocedure Evaluation (Signed)
Anesthesia Evaluation  Patient identified by MRN, date of birth, ID band Patient awake    Reviewed: Allergy & Precautions, NPO status , Patient's Chart, lab work & pertinent test results, reviewed documented beta blocker date and time   History of Anesthesia Complications Negative for: history of anesthetic complications  Airway Mallampati: II  TM Distance: >3 FB     Dental no notable dental hx.    Pulmonary neg COPD, former smoker   breath sounds clear to auscultation       Cardiovascular (-) hypertension(-) angina (-) Past MI, (-) Cardiac Stents, (-) CABG and (-) CHF  Rhythm:Regular Rate:Normal     Neuro/Psych neg Seizures  Neuromuscular disease    GI/Hepatic ,neg GERD  ,,(+) neg Cirrhosis        Endo/Other  diabetes, Poorly Controlled, Type 2    Renal/GU Renal disease     Musculoskeletal   Abdominal   Peds  Hematology  (+) Blood dyscrasia, anemia   Anesthesia Other Findings   Reproductive/Obstetrics                              Anesthesia Physical Anesthesia Plan  ASA: 3  Anesthesia Plan: General   Post-op Pain Management: Regional block*   Induction: Intravenous  PONV Risk Score and Plan: 2 and Ondansetron and Dexamethasone  Airway Management Planned: LMA  Additional Equipment:   Intra-op Plan:   Post-operative Plan: Extubation in OR  Informed Consent: I have reviewed the patients History and Physical, chart, labs and discussed the procedure including the risks, benefits and alternatives for the proposed anesthesia with the patient or authorized representative who has indicated his/her understanding and acceptance.     Dental advisory given  Plan Discussed with: CRNA  Anesthesia Plan Comments:          Anesthesia Quick Evaluation

## 2023-03-14 NOTE — Plan of Care (Signed)
  Problem: Coping: Goal: Ability to adjust to condition or change in health will improve Outcome: Progressing   Problem: Metabolic: Goal: Ability to maintain appropriate glucose levels will improve Outcome: Progressing   Problem: Skin Integrity: Goal: Risk for impaired skin integrity will decrease Outcome: Progressing   Problem: Education: Goal: Knowledge of General Education information will improve Description: Including pain rating scale, medication(s)/side effects and non-pharmacologic comfort measures Outcome: Progressing   Problem: Health Behavior/Discharge Planning: Goal: Ability to manage health-related needs will improve Outcome: Progressing   Problem: Clinical Measurements: Goal: Ability to maintain clinical measurements within normal limits will improve Outcome: Progressing Goal: Will remain free from infection Outcome: Progressing   Problem: Activity: Goal: Risk for activity intolerance will decrease Outcome: Progressing   Problem: Nutrition: Goal: Adequate nutrition will be maintained Outcome: Progressing   Problem: Elimination: Goal: Will not experience complications related to bowel motility Outcome: Progressing Goal: Will not experience complications related to urinary retention Outcome: Progressing   Problem: Pain Management: Goal: General experience of comfort will improve Outcome: Progressing   Problem: Skin Integrity: Goal: Risk for impaired skin integrity will decrease Outcome: Progressing   Problem: Self-Care: Goal: Ability to meet self-care needs will improve Outcome: Progressing   Problem: Pain Management: Goal: Pain level will decrease with appropriate interventions Outcome: Progressing   Problem: Skin Integrity: Goal: Demonstration of wound healing without infection will improve Outcome: Progressing

## 2023-03-14 NOTE — Anesthesia Procedure Notes (Signed)
Anesthesia Regional Block: Adductor canal block   Pre-Anesthetic Checklist: , timeout performed,  Correct Patient, Correct Site, Correct Laterality,  Correct Procedure, Correct Position, site marked,  Risks and benefits discussed,  Surgical consent,  Pre-op evaluation,  At surgeon's request and post-op pain management  Laterality: Left  Prep: Maximum Sterile Barrier Precautions used, chloraprep       Needles:  Injection technique: Single-shot  Needle Type: Echogenic Needle      Needle Gauge: 20     Additional Needles:   Procedures:,,,, ultrasound used (permanent image in chart),,    Narrative:  Start time: 03/14/2023 12:18 PM End time: 03/14/2023 12:20 PM Injection made incrementally with aspirations every 5 mL.  Performed by: Personally  Anesthesiologist: Mariann Barter, MD

## 2023-03-14 NOTE — Progress Notes (Signed)
PROGRESS NOTE    Jon House  ZOX:[id number] DOB: August 21, 1970 DOA: 03/10/2023 PCP: Patient, No Pcp Per   Brief Narrative:   52 y.o. male with past medical history significant for DM2, history of osteomyelitis/recurrent diabetic foot infections s/p right/left TMA with recent revision in April 2024 presented with progressive left foot wound with foul odor/drainage with fever, nausea and vomiting.  Workup suggested possible left foot osteomyelitis with x-ray showing possible osseous demineralization of the fifth metatarsal with surrounding soft tissue swelling.  He was started on broad-spectrum antibiotics.  Orthopedics was consulted.  Assessment & Plan:   Acute left foot osteomyelitis -Elevation with history of diabetic foot infections and osteomyelitis status post TMA -Currently on broad-spectrum antibiotics.  Orthopedics planning for possible left transtibial amputation today.  N.p.o. for now.  Leukocytosis -Resolved  Anemia of chronic disease -From chronic illnesses.  Hemoglobin currently stable.  Diabetes mellitus type 2 with hyperglycemia--continue long-acting insulin, short acting insulin with meals along with CBGs with SSI  Hyponatremia -Mild.  Monitor.  Encourage oral intake  DVT prophylaxis: Heparin subcu  code Status: Full Family Communication: None at bedside Disposition Plan: Status is: Inpatient Remains inpatient appropriate because: Of severity of illness  Consultants: Orthopedics  Procedures: None  Antimicrobials:  Anti-infectives (From admission, onward)    Start     Dose/Rate Route Frequency Ordered Stop   03/11/23 1800  vancomycin (VANCOCIN) IVPB 1000 mg/200 mL premix        1,000 mg 200 mL/hr over 60 Minutes Intravenous Every 12 hours 03/11/23 0824     03/11/23 0600  vancomycin (VANCOREADY) IVPB 750 mg/150 mL  Status:  Discontinued        750 mg 150 mL/hr over 60 Minutes Intravenous Every 12 hours 03/10/23 1758 03/11/23 0824   03/10/23 2200   metroNIDAZOLE (FLAGYL) tablet 500 mg        500 mg Oral Every 12 hours 03/10/23 1656 03/17/23 2159   03/10/23 2200  ceFEPIme (MAXIPIME) 2 g in sodium chloride 0.9 % 100 mL IVPB        2 g 200 mL/hr over 30 Minutes Intravenous Every 8 hours 03/10/23 1758     03/10/23 1630  vancomycin (VANCOREADY) IVPB 1500 mg/300 mL        1,500 mg 150 mL/hr over 120 Minutes Intravenous  Once 03/10/23 1556 03/10/23 1838   03/10/23 1600  piperacillin-tazobactam (ZOSYN) IVPB 3.375 g        3.375 g 100 mL/hr over 30 Minutes Intravenous  Once 03/10/23 1556 03/10/23 1706        Subjective: Patient seen and examined at bedside.  Denies fever, vomiting, chest pain.  Objective: Vitals:   03/13/23 1520 03/13/23 2109 03/14/23 0436 03/14/23 0720  BP: 139/81 135/73 118/68 128/73  Pulse: 88 95 84 88  Resp: 16 17 17 16   Temp: 98.4 F (36.9 C) (!) 100.7 F (38.2 C) 98.7 F (37.1 C) 98.4 F (36.9 C)  TempSrc: Oral Oral Oral Oral  SpO2: 98% 97% 98% 95%  Weight:      Height:        Intake/Output Summary (Last 24 hours) at 03/14/2023 0949 Last data filed at 03/14/2023 0600 Gross per 24 hour  Intake 382.04 ml  Output 550 ml  Net -167.96 ml   Filed Weights   03/10/23 1116  Weight: 67.6 kg    Examination:  General exam: Appears calm and comfortable.  On room air. Respiratory system: Bilateral decreased breath sounds at bases Cardiovascular system: S1 & S2 heard,  Rate controlled Gastrointestinal system: Abdomen is nondistended, soft and nontender. Normal bowel sounds heard. Extremities: No cyanosis, clubbing, edema.  Left foot stump with ulcerations and foul-smelling discharge present  Data Reviewed: I have personally reviewed following labs and imaging studies  CBC: Recent Labs  Lab 03/10/23 1426 03/11/23 0602 03/12/23 0720 03/13/23 0636 03/14/23 0534  WBC 12.4* 7.7 6.0 6.1 6.9  NEUTROABS 10.6*  --   --   --   --   HGB 9.4* 9.7* 10.4* 10.7* 11.2*  HCT 27.6* 29.4* 31.4* 31.1* 34.2*  MCV  88.2 87.0 87.0 86.1 86.4  PLT 303 313 364 355 392   Basic Metabolic Panel: Recent Labs  Lab 03/10/23 1426 03/11/23 0602 03/12/23 0720 03/13/23 0636 03/14/23 0534  NA 135 135 132* 133* 134*  K 3.8 3.8 4.2 4.2 4.0  CL 102 102 99 97* 98  CO2 25 23 24 24 27   GLUCOSE 149* 228* 262* 251* 202*  BUN 28* 19 15 18  23*  CREATININE 1.19 0.78 0.68 0.72 0.74  CALCIUM 8.5* 8.5* 8.6* 8.7* 9.2   GFR: Estimated Creatinine Clearance: 95.3 mL/min (by C-G formula based on SCr of 0.74 mg/dL). Liver Function Tests: Recent Labs  Lab 03/10/23 1426  AST 11*  ALT 14  ALKPHOS 81  BILITOT 0.4  PROT 7.6  ALBUMIN 3.0*   No results for input(s): "LIPASE", "AMYLASE" in the last 168 hours. No results for input(s): "AMMONIA" in the last 168 hours. Coagulation Profile: No results for input(s): "INR", "PROTIME" in the last 168 hours. Cardiac Enzymes: No results for input(s): "CKTOTAL", "CKMB", "CKMBINDEX", "TROPONINI" in the last 168 hours. BNP (last 3 results) No results for input(s): "PROBNP" in the last 8760 hours. HbA1C: No results for input(s): "HGBA1C" in the last 72 hours. CBG: Recent Labs  Lab 03/13/23 0751 03/13/23 1143 03/13/23 1700 03/13/23 2106 03/14/23 0756  GLUCAP 294* 320* 153* 195* 242*   Lipid Profile: No results for input(s): "CHOL", "HDL", "LDLCALC", "TRIG", "CHOLHDL", "LDLDIRECT" in the last 72 hours. Thyroid Function Tests: No results for input(s): "TSH", "T4TOTAL", "FREET4", "T3FREE", "THYROIDAB" in the last 72 hours. Anemia Panel: No results for input(s): "VITAMINB12", "FOLATE", "FERRITIN", "TIBC", "IRON", "RETICCTPCT" in the last 72 hours. Sepsis Labs: No results for input(s): "PROCALCITON", "LATICACIDVEN" in the last 168 hours.  Recent Results (from the past 240 hour(s))  Blood culture (routine x 2)     Status: None (Preliminary result)   Collection Time: 03/10/23  4:03 PM   Specimen: BLOOD  Result Value Ref Range Status   Specimen Description   Final    BLOOD  LEFT ANTECUBITAL Performed at [facility], [address] [place], [place] [address]    Special Requests   Final    BOTTLES DRAWN AEROBIC AND ANAEROBIC Blood Culture adequate volume Performed at [facility], [address] [place], [place] [address]    Culture   Final    NO GROWTH 4 DAYS Performed at [facility], [address] [place], [place] [address]    Report Status PENDING  Incomplete  Blood culture (routine x 2)     Status: None (Preliminary result)   Collection Time: 03/10/23  4:05 PM   Specimen: BLOOD  Result Value Ref Range Status   Specimen Description   Final    BLOOD RIGHT ANTECUBITAL Performed at [facility], [address], [place], [place] [address]    Special Requests   Final    BOTTLES DRAWN AEROBIC AND ANAEROBIC  Blood Culture adequate volume Performed at [facility], [address], [place], [place] [address]    Culture   Final    NO GROWTH 4 DAYS Performed at [facility], [address], [place], [place] [address]    Report Status PENDING  Incomplete  Surgical PCR screen     Status: Abnormal   Collection Time: 03/11/23 12:33 AM   Specimen: Nasal Mucosa; Nasal Swab  Result Value Ref Range Status   MRSA, PCR NEGATIVE NEGATIVE Final   Staphylococcus aureus POSITIVE (A) NEGATIVE Final    Comment: (NOTE) The Xpert SA Assay (FDA approved for NASAL specimens in patients 25 years of age and older), is one component of a comprehensive surveillance program. It is not intended to diagnose infection nor to guide or monitor treatment. Performed at [facility], [address] [place], [place] [address]          Radiology Studies: No results found.      Scheduled Meds:  feeding supplement  237 mL Oral Q breakfast   heparin  5,000 Units Subcutaneous Q8H   influenza vac split trivalent PF  0.5 mL Intramuscular Tomorrow-1000   insulin aspart  0-5 Units  Subcutaneous QHS   insulin aspart  0-9 Units Subcutaneous TID WC   insulin aspart  5 Units Subcutaneous TID WC   insulin glargine-yfgn  28 Units Subcutaneous Daily   metroNIDAZOLE  500 mg Oral Q12H   multivitamin with minerals  1 tablet Oral Daily   mupirocin ointment  1 Application Nasal BID   nutrition supplement (JUVEN)  1 packet Oral BID BM   Continuous Infusions:  ceFEPime (MAXIPIME) IV 2 g (03/14/23 0556)   vancomycin 1,000 mg (03/14/23 0640)          Glade Lloyd, MD [facility] 03/14/2023, 9:50 AM

## 2023-03-14 NOTE — Transfer of Care (Signed)
Immediate Anesthesia Transfer of Care Note  Patient: Jon House  Procedure(s) Performed: LEFT BELOW KNEE AMPUTATION (Left: Knee) APPLICATION OF WOUND VAC (Left: Knee)  Patient Location: PACU  Anesthesia Type:General  Level of Consciousness: awake, alert , and oriented  Airway & Oxygen Therapy: Patient Spontanous Breathing and Patient connected to face mask oxygen  Post-op Assessment: Report given to RN and Post -op Vital signs reviewed and stable  Post vital signs: Reviewed and stable  Last Vitals:  Vitals Value Taken Time  BP 109/68 03/14/23 1345  Temp    Pulse 73 03/14/23 1347  Resp 17 03/14/23 1347  SpO2 100 % 03/14/23 1347  Vitals shown include unfiled device data.  Last Pain:  Vitals:   03/14/23 1138  TempSrc: Oral  PainSc: 0-No pain         Complications: No notable events documented.

## 2023-03-14 NOTE — Anesthesia Procedure Notes (Signed)
Procedure Name: LMA Insertion Date/Time: 03/14/2023 1:00 PM  Performed by: Waynard Edwards, CRNAPre-anesthesia Checklist: Patient identified and Emergency Drugs available Patient Re-evaluated:Patient Re-evaluated prior to induction Oxygen Delivery Method: Circle system utilized Preoxygenation: Pre-oxygenation with 100% oxygen Induction Type: IV induction Ventilation: Mask ventilation without difficulty LMA: LMA inserted LMA Size: 4.0 Number of attempts: 1 Placement Confirmation: positive ETCO2 and breath sounds checked- equal and bilateral Dental Injury: Teeth and Oropharynx as per pre-operative assessment

## 2023-03-14 NOTE — Op Note (Signed)
03/14/2023  1:45 PM  PATIENT:  Jon House    PRE-OPERATIVE DIAGNOSIS:  Osteomyelitis Left Foot  POST-OPERATIVE DIAGNOSIS:  Same  PROCEDURE:  LEFT BELOW KNEE AMPUTATION, APPLICATION OF WOUND VAC Application of Kerecis micro graft 38 cm  Application of Prevena customizable and Prevena arthroform wound VAC dressings Application of Vive Wear stump shrinker and the Hanger limb protector  SURGEON:  [name], MD  ANESTHESIA:   General  PREOPERATIVE INDICATIONS:  [name] is a  52 y.o. male with a diagnosis of Osteomyelitis Left Foot who failed conservative measures and elected for surgical management.    The risks benefits and alternatives were discussed with the patient preoperatively including but not limited to the risks of infection, bleeding, nerve injury, cardiopulmonary complications, the need for revision surgery, among others, and the patient was willing to proceed.  OPERATIVE IMPLANTS:   Implant Name Type Inv. Item Serial No. Manufacturer Lot No. LRB No. Used Action  GRAFT SKIN WND MICRO 38 - [id number] Tissue GRAFT SKIN WND MICRO 38  [employer] [id number] Left 1 Implanted     OPERATIVE FINDINGS: tissue margins healthy and viable  OPERATIVE PROCEDURE: Patient was brought to the operating room after undergoing a regional anesthetic.  After adequate levels anesthesia were obtained a thigh tourniquet was placed and the lower extremity was prepped using DuraPrep draped into a sterile field. The foot was draped out of the sterile field with impervious stockinette.  A timeout was called and the tourniquet inflated.  A transverse skin incision was made 12 cm distal to the tibial tubercle, the incision curved proximally, and a large posterior flap was created.  The tibia was transected just proximal to the skin incision and beveled anteriorly.  The fibula was transected just proximal to the tibial incision.  The sciatic nerve was pulled cut and allowed to  retract.  The vascular bundles were suture ligated with 2-0 silk.  The tourniquet was deflated and hemostasis obtained.      The Kerecis micro powder 38 cm was applied to the open wound that has a 200 cm surface area.   The deep and superficial fascial layers were closed using #1 Vicryl.  The skin was closed using staples.    The Prevena customizable dressing was applied this was overwrapped with the arthroform sponge.  Collier Flowers was used to secure the sponges and the circumferential compression was secured to the skin with Dermatac.  This was connected to the wound VAC pump and had a good suction fit this was covered with a stump shrinker and a limb protector.  Patient was taken to the PACU in stable condition.   DISCHARGE PLANNING:  Antibiotic duration: 24-hour antibiotics  Weightbearing: Nonweightbearing on the operative extremity  Pain medication: Opioid pathway  Dressing care/ Wound VAC: Continue wound VAC with the Prevena plus pump at discharge for 1 week  Ambulatory devices: Walker or kneeling scooter  Discharge to: Discharge planning based on recommendations per physical therapy  Follow-up: In the office 1 week after discharge.

## 2023-03-14 NOTE — Interval H&P Note (Signed)
History and Physical Interval Note:  03/14/2023 6:36 AM  Jon House  has presented today for surgery, with the diagnosis of Osteomyelitis Left Foot.  The various methods of treatment have been discussed with the patient and family. After consideration of risks, benefits and other options for treatment, the patient has consented to  Procedure(s): LEFT BELOW KNEE AMPUTATION (Left) as a surgical intervention.  The patient's history has been reviewed, patient examined, no change in status, stable for surgery.  I have reviewed the patient's chart and labs.  Questions were answered to the patient's satisfaction.     [name]

## 2023-03-14 NOTE — Plan of Care (Signed)
  Problem: Education: Goal: Ability to describe self-care measures that may prevent or decrease complications (Diabetes Survival Skills Education) will improve Outcome: Progressing Goal: Individualized Educational Video(s) Outcome: Progressing   Problem: Coping: Goal: Ability to adjust to condition or change in health will improve Outcome: Progressing   Problem: Fluid Volume: Goal: Ability to maintain a balanced intake and output will improve Outcome: Progressing   Problem: Health Behavior/Discharge Planning: Goal: Ability to identify and utilize available resources and services will improve Outcome: Progressing Goal: Ability to manage health-related needs will improve Outcome: Progressing   Problem: Metabolic: Goal: Ability to maintain appropriate glucose levels will improve Outcome: Progressing   Problem: Nutritional: Goal: Maintenance of adequate nutrition will improve Outcome: Progressing Goal: Progress toward achieving an optimal weight will improve Outcome: Progressing   Problem: Skin Integrity: Goal: Risk for impaired skin integrity will decrease Outcome: Progressing   Problem: Tissue Perfusion: Goal: Adequacy of tissue perfusion will improve Outcome: Progressing   Problem: Education: Goal: Knowledge of General Education information will improve Description: Including pain rating scale, medication(s)/side effects and non-pharmacologic comfort measures Outcome: Progressing   Problem: Health Behavior/Discharge Planning: Goal: Ability to manage health-related needs will improve Outcome: Progressing   Problem: Clinical Measurements: Goal: Ability to maintain clinical measurements within normal limits will improve Outcome: Progressing Goal: Will remain free from infection Outcome: Progressing Goal: Diagnostic test results will improve Outcome: Progressing Goal: Respiratory complications will improve Outcome: Progressing Goal: Cardiovascular complication will  be avoided Outcome: Progressing   Problem: Activity: Goal: Risk for activity intolerance will decrease Outcome: Progressing   Problem: Nutrition: Goal: Adequate nutrition will be maintained Outcome: Progressing   Problem: Coping: Goal: Level of anxiety will decrease Outcome: Progressing   Problem: Elimination: Goal: Will not experience complications related to bowel motility Outcome: Progressing Goal: Will not experience complications related to urinary retention Outcome: Progressing   Problem: Pain Management: Goal: General experience of comfort will improve Outcome: Progressing   Problem: Safety: Goal: Ability to remain free from injury will improve Outcome: Progressing   Problem: Skin Integrity: Goal: Risk for impaired skin integrity will decrease Outcome: Progressing   Problem: Education: Goal: Knowledge of the prescribed therapeutic regimen will improve Outcome: Progressing Goal: Ability to verbalize activity precautions or restrictions will improve Outcome: Progressing Goal: Understanding of discharge needs will improve Outcome: Progressing   Problem: Activity: Goal: Ability to perform//tolerate increased activity and mobilize with assistive devices will improve Outcome: Progressing   Problem: Clinical Measurements: Goal: Postoperative complications will be avoided or minimized Outcome: Progressing   Problem: Self-Care: Goal: Ability to meet self-care needs will improve Outcome: Progressing   Problem: Self-Concept: Goal: Ability to maintain and perform role responsibilities to the fullest extent possible will improve Outcome: Progressing   Problem: Pain Management: Goal: Pain level will decrease with appropriate interventions Outcome: Progressing   Problem: Skin Integrity: Goal: Demonstration of wound healing without infection will improve Outcome: Progressing

## 2023-03-15 ENCOUNTER — Encounter (HOSPITAL_COMMUNITY): Payer: Self-pay | Admitting: Orthopedic Surgery

## 2023-03-15 LAB — CULTURE, BLOOD (ROUTINE X 2)
Culture: NO GROWTH
Culture: NO GROWTH
Special Requests: ADEQUATE
Special Requests: ADEQUATE

## 2023-03-15 LAB — BASIC METABOLIC PANEL
Anion gap: 8 (ref 5–15)
BUN: 20 mg/dL (ref 6–20)
CO2: 23 mmol/L (ref 22–32)
Calcium: 8.4 mg/dL — ABNORMAL LOW (ref 8.9–10.3)
Chloride: 97 mmol/L — ABNORMAL LOW (ref 98–111)
Creatinine, Ser: 0.93 mg/dL (ref 0.61–1.24)
GFR, Estimated: >60 mL/min (ref >60)
Glucose, Bld: 382 mg/dL — ABNORMAL HIGH (ref 70–99)
Potassium: 4.2 mmol/L (ref 3.5–5.1)
Sodium: 128 mmol/L — ABNORMAL LOW (ref 135–145)

## 2023-03-15 LAB — C-REACTIVE PROTEIN: CRP: 8.8 mg/dL — ABNORMAL HIGH (ref <1.0)

## 2023-03-15 LAB — CBC WITH DIFFERENTIAL/PLATELET
Abs Immature Granulocytes: 0.06 10*3/uL (ref 0.00–0.07)
Basophils Absolute: 0 10*3/uL (ref 0.0–0.1)
Basophils Relative: 0 %
Eosinophils Absolute: 0 10*3/uL (ref 0.0–0.5)
Eosinophils Relative: 0 %
HCT: 32.6 % — ABNORMAL LOW (ref 39.0–52.0)
Hemoglobin: 10.9 g/dL — ABNORMAL LOW (ref 13.0–17.0)
Immature Granulocytes: 1 %
Lymphocytes Relative: 7 %
Lymphs Abs: 0.8 10*3/uL (ref 0.7–4.0)
MCH: 29 pg (ref 26.0–34.0)
MCHC: 33.4 g/dL (ref 30.0–36.0)
MCV: 86.7 fL (ref 80.0–100.0)
Monocytes Absolute: 0.7 10*3/uL (ref 0.1–1.0)
Monocytes Relative: 6 %
Neutro Abs: 10.4 10*3/uL — ABNORMAL HIGH (ref 1.7–7.7)
Neutrophils Relative %: 86 %
Platelets: 430 10*3/uL — ABNORMAL HIGH (ref 150–400)
RBC: 3.76 MIL/uL — ABNORMAL LOW (ref 4.22–5.81)
RDW: 11.9 % (ref 11.5–15.5)
WBC: 12 10*3/uL — ABNORMAL HIGH (ref 4.0–10.5)
nRBC: 0 % (ref 0.0–0.2)

## 2023-03-15 LAB — GLUCOSE, CAPILLARY
Glucose-Capillary: 353 mg/dL — ABNORMAL HIGH (ref 70–99)
Glucose-Capillary: 260 mg/dL — ABNORMAL HIGH (ref 70–99)
Glucose-Capillary: 146 mg/dL — ABNORMAL HIGH (ref 70–99)
Glucose-Capillary: 114 mg/dL — ABNORMAL HIGH (ref 70–99)

## 2023-03-15 LAB — MAGNESIUM: Magnesium: 2 mg/dL (ref 1.7–2.4)

## 2023-03-15 MED ORDER — ACETAMINOPHEN 500 MG PO TABS
1000.0000 mg | ORAL_TABLET | Freq: Three times a day (TID) | ORAL | Status: DC
  Administered 2023-03-15 – 2023-03-16 (×2): 1000 mg via ORAL
  Filled 2023-03-15 (×2): qty 2

## 2023-03-15 MED ORDER — KETOROLAC TROMETHAMINE 30 MG/ML IJ SOLN
30.0000 mg | Freq: Once | INTRAMUSCULAR | Status: AC
  Administered 2023-03-15: 30 mg via INTRAVENOUS
  Filled 2023-03-15: qty 1

## 2023-03-15 MED ORDER — METHOCARBAMOL 500 MG PO TABS
500.0000 mg | ORAL_TABLET | Freq: Four times a day (QID) | ORAL | Status: DC | PRN
  Administered 2023-03-15 – 2023-03-16 (×2): 500 mg via ORAL
  Filled 2023-03-15 (×2): qty 1

## 2023-03-15 NOTE — TOC Progression Note (Signed)
Transition of Care (TOC) - Progression Note   PT recommending HHPT. [name] is the agency assisting patient with no insurance. Left a message for Amy with [facility]  OT recommending tub bench and bedside commode left a message for Mitch with [employer] regarding price  Patient Details  Name: Jon House MRN: [id number] Date of Birth: Oct 29, 1970  Transition of [facility]) CM/SW Contact  Kingsley Plan, RN Phone Number: 03/15/2023, 12:10 PM  Clinical Narrative:       Expected Discharge Plan: Home/Self Care Barriers to Discharge: Continued Medical Work up  Expected Discharge Plan and Services   Discharge Planning Services: CM Consult   Living arrangements for the past 2 months: Single Family Home                 DME Arranged: N/A         HH Arranged: NA           Social Determinants of Health (SDOH) Interventions SDOH Screenings   Food Insecurity: Food Insecurity Present (03/10/2023)  Housing: Low Risk  (03/10/2023)  Transportation Needs: No Transportation Needs (03/10/2023)  Utilities: Not At Risk (03/10/2023)  Depression (PHQ2-9): Low Risk  (10/02/2018)  Tobacco Use: Medium Risk (03/14/2023)    Readmission Risk Interventions     No data to display

## 2023-03-15 NOTE — Progress Notes (Signed)
[facility] IP rehab admissions - Noted PT/OT recommending home health therapies.  Will not need an inpatient rehab stay.  I will sign off for CIR at this time.  [phone]

## 2023-03-15 NOTE — Progress Notes (Signed)
Pharmacy Antibiotic Note  Jon House is a 52 y.o. male admitted on 03/10/2023 with left foot infection/osteomyelitis.  Pharmacy has been consulted for vancomycin & cefepime dosing.  S/p L BKA 11/13  Plan: Abx to stop 24h post OR (11/14)  Height: 5\' 6"  (167.6 cm) Weight: 67.6 kg (149 lb) IBW/kg (Calculated) : 63.8  Temp (24hrs), Avg:98 F (36.7 C), Min:97.7 F (36.5 C), Max:98.2 F (36.8 C)  Recent Labs  Lab 03/10/23 1426 03/11/23 0602 03/12/23 0720 03/13/23 0636 03/14/23 0534  WBC 12.4* 7.7 6.0 6.1 6.9  CREATININE 1.19 0.78 0.68 0.72 0.74    Estimated Creatinine Clearance: 95.3 mL/min (by C-G formula based on SCr of 0.74 mg/dL).    Allergies  Allergen Reactions   Bee Venom Hives   Metformin And Related Diarrhea   Antimicrobials this admission:  11/9 Vanc 1500 x 1 11/10 Vanc 750 x 1, then 1000 q12hr >>11/14 11/9 Zosyn x 1 11/9 flagyl >>11/14 11/9 Cefepime>> 11/14 Dose adjustments this admission:   Microbiology results:  11/9 BCx2 : ngtd x3d 11/10 St aureus PCR +, MRSA PCR -  Thank you for allowing pharmacy to be a part of this patient's care.  Rexford Maus, PharmD, BCPS 03/15/2023 9:43 AM

## 2023-03-15 NOTE — TOC Progression Note (Addendum)
Transition of Care (TOC) - Progression Note   Spoke to patient at bedside with [employer] interpreter Lars Mage [phone]   PT recommending HHPT   DME recommendations: tub bench and bedside commode.   Spoke to patient at bedside explained above. NCM did schedule an appointment for patient to establish care with a PCP . Appointment information on AVS.   Entered home health orders and secure chatted Dr Lajoyce Corners to sign. PCP will not follow until after appointment.   NCM gave bedside commode to patient.   Cost of tub bench $55.00 . TOC supervisor agreed to pay for tub bench . Ordered same through Williamson with [employer].   TOC changed pharmacy to [facility] pharmacy .    TOC Pharmacy will call patient at discharge with cost of medication, he can pay by cash, card or be billed.    After discharge he can use [employer]   Patient has transportation home at discharge.   Lives with wife and children.   He has wheel chair , walker and crutches at home already.    Patient Details  Name: Jon House MRN: [id number] Date of Birth: 05-09-1970  Transition of Care [facility]) CM/SW Contact  Jeancarlo Leffler, Adria Devon, RN Phone Number: 03/15/2023, 1:02 PM  Clinical Narrative:       Expected Discharge Plan: Home/Self Care Barriers to Discharge: Continued Medical Work up  Expected Discharge Plan and Services   Discharge Planning Services: CM Consult   Living arrangements for the past 2 months: Single Family Home                 DME Arranged: N/A         HH Arranged: NA           Social Determinants of Health (SDOH) Interventions SDOH Screenings   Food Insecurity: Food Insecurity Present (03/10/2023)  Housing: Low Risk  (03/10/2023)  Transportation Needs: No Transportation Needs (03/10/2023)  Utilities: Not At Risk (03/10/2023)  Depression (PHQ2-9): Low Risk  (10/02/2018)  Tobacco Use: Medium Risk (03/14/2023)    Readmission Risk Interventions     No data to display

## 2023-03-15 NOTE — Progress Notes (Signed)
PROGRESS NOTE    Jon House  VHQ:[id number] DOB: Oct 03, 1970 DOA: 03/10/2023 PCP: Patient, No Pcp Per   Brief Narrative:   52 y.o. male with past medical history significant for DM2, history of osteomyelitis/recurrent diabetic foot infections s/p right/left TMA with recent revision in April 2024 presented with progressive left foot wound with foul odor/drainage with fever, nausea and vomiting.  Workup suggested possible left foot osteomyelitis with x-ray showing possible osseous demineralization of the fifth metatarsal with surrounding soft tissue swelling.  He was started on broad-spectrum antibiotics.  Orthopedics was consulted.  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  Assessment & Plan:   Acute left foot osteomyelitis -Has history of diabetic foot infections and osteomyelitis status post TMA -Currently on broad-spectrum antibiotics.   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  Pain management as per orthopedics.  DC antibiotics after today's doses as per orthopedics recommendations. -PT eval  Leukocytosis -Resolved  Anemia of chronic disease -From chronic illnesses.  Hemoglobin currently stable.  Diabetes mellitus type 2 with hyperglycemia--continue long-acting insulin, short acting insulin with meals along with CBGs with SSI  Hyponatremia -Mild.  Monitor.  Labs pending today.  Encourage oral intake  DVT prophylaxis: Heparin subcu  code Status: Full Family Communication: None at bedside Disposition Plan: Status is: Inpatient Remains inpatient appropriate because: Of severity of illness  Consultants: Orthopedics  Procedures: As above Antimicrobials:  Anti-infectives (From admission, onward)    Start     Dose/Rate Route Frequency Ordered Stop   03/15/23 0600  ceFAZolin (ANCEF) IVPB 2g/100 mL premix        2 g 200 mL/hr over 30 Minutes  Intravenous On call to O.R. 03/14/23 1124 03/14/23 1331   03/14/23 2100  ceFAZolin (ANCEF) IVPB 2g/100 mL premix        2 g 200 mL/hr over 30 Minutes Intravenous Every 8 hours 03/14/23 1448 03/15/23 0438   03/14/23 1119  ceFAZolin (ANCEF) 2-4 GM/100ML-% IVPB       Note to Pharmacy: [facility], [place]: cabinet override      03/14/23 1119 03/14/23 2039   03/11/23 1800  vancomycin (VANCOCIN) IVPB 1000 mg/200 mL premix        1,000 mg 200 mL/hr over 60 Minutes Intravenous Every 12 hours 03/11/23 0824     03/11/23 0600  vancomycin (VANCOREADY) IVPB 750 mg/150 mL  Status:  Discontinued        750 mg 150 mL/hr over 60 Minutes Intravenous Every 12 hours 03/10/23 1758 03/11/23 0824   03/10/23 2200  metroNIDAZOLE (FLAGYL) tablet 500 mg        500 mg Oral Every 12 hours 03/10/23 1656 03/17/23 2159   03/10/23 2200  ceFEPIme (MAXIPIME) 2 g in sodium chloride 0.9 % 100 mL IVPB        2 g 200 mL/hr over 30 Minutes Intravenous Every 8 hours 03/10/23 1758     03/10/23 1630  vancomycin (VANCOREADY) IVPB 1500 mg/300 mL        1,500 mg 150 mL/hr over 120 Minutes Intravenous  Once 03/10/23 1556 03/10/23 1838   03/10/23 1600  piperacillin-tazobactam (ZOSYN) IVPB 3.375 g        3.375 g 100 mL/hr over 30 Minutes Intravenous  Once 03/10/23 1556 03/10/23 1706        Subjective: Patient seen and examined at bedside.  Complains of left lower extremity pain.  No fever, vomiting, chest  pain reported.  Objective: Vitals:   03/14/23 1636 03/14/23 2057 03/15/23 0049 03/15/23 0553  BP: 112/64 100/67 118/70 129/70  Pulse: 84 84 84 84  Resp: 16 20 20 18   Temp: 98.1 F (36.7 C) 98 F (36.7 C) 98.1 F (36.7 C) 98.2 F (36.8 C)  TempSrc: Oral Oral Oral Oral  SpO2: 98% 97% 94% 91%  Weight:      Height:        Intake/Output Summary (Last 24 hours) at 03/15/2023 0735 Last data filed at 03/14/2023 2222 Gross per 24 hour  Intake 2295.67 ml  Output 975 ml  Net 1320.67 ml   Filed Weights   03/10/23 1116   Weight: 67.6 kg    Examination:  General: Currently on room air.  No distress.  respiratory: Decreased breath sounds at bases bilaterally with some crackles CVS: Currently rate controlled; S1-S2 heard  abdominal: Soft, nontender, slightly distended, no organomegaly; normal bowel sounds are heard  extremities: Left BKA with wound VAC present   Data Reviewed: I have personally reviewed following labs and imaging studies  CBC: Recent Labs  Lab 03/10/23 1426 03/11/23 0602 03/12/23 0720 03/13/23 0636 03/14/23 0534  WBC 12.4* 7.7 6.0 6.1 6.9  NEUTROABS 10.6*  --   --   --   --   HGB 9.4* 9.7* 10.4* 10.7* 11.2*  HCT 27.6* 29.4* 31.4* 31.1* 34.2*  MCV 88.2 87.0 87.0 86.1 86.4  PLT 303 313 364 355 392   Basic Metabolic Panel: Recent Labs  Lab 03/10/23 1426 03/11/23 0602 03/12/23 0720 03/13/23 0636 03/14/23 0534  NA 135 135 132* 133* 134*  K 3.8 3.8 4.2 4.2 4.0  CL 102 102 99 97* 98  CO2 25 23 24 24 27   GLUCOSE 149* 228* 262* 251* 202*  BUN 28* 19 15 18  23*  CREATININE 1.19 0.78 0.68 0.72 0.74  CALCIUM 8.5* 8.5* 8.6* 8.7* 9.2   GFR: Estimated Creatinine Clearance: 95.3 mL/min (by C-G formula based on SCr of 0.74 mg/dL). Liver Function Tests: Recent Labs  Lab 03/10/23 1426  AST 11*  ALT 14  ALKPHOS 81  BILITOT 0.4  PROT 7.6  ALBUMIN 3.0*   No results for input(s): "LIPASE", "AMYLASE" in the last 168 hours. No results for input(s): "AMMONIA" in the last 168 hours. Coagulation Profile: No results for input(s): "INR", "PROTIME" in the last 168 hours. Cardiac Enzymes: No results for input(s): "CKTOTAL", "CKMB", "CKMBINDEX", "TROPONINI" in the last 168 hours. BNP (last 3 results) No results for input(s): "PROBNP" in the last 8760 hours. HbA1C: Recent Labs    03/14/23 1506  HGBA1C 10.6*   CBG: Recent Labs  Lab 03/14/23 0756 03/14/23 1120 03/14/23 1346 03/14/23 1644 03/14/23 2102  GLUCAP 242* 217* 165* 191* 302*   Lipid Profile: No results for  input(s): "CHOL", "HDL", "LDLCALC", "TRIG", "CHOLHDL", "LDLDIRECT" in the last 72 hours. Thyroid Function Tests: No results for input(s): "TSH", "T4TOTAL", "FREET4", "T3FREE", "THYROIDAB" in the last 72 hours. Anemia Panel: No results for input(s): "VITAMINB12", "FOLATE", "FERRITIN", "TIBC", "IRON", "RETICCTPCT" in the last 72 hours. Sepsis Labs: No results for input(s): "PROCALCITON", "LATICACIDVEN" in the last 168 hours.  Recent Results (from the past 240 hour(s))  Blood culture (routine x 2)     Status: None   Collection Time: 03/10/23  4:03 PM   Specimen: BLOOD  Result Value Ref Range Status   Specimen Description   Final    BLOOD LEFT ANTECUBITAL Performed at [facility], [address] [place], [place]  [address]    Special Requests   Final    BOTTLES DRAWN AEROBIC AND ANAEROBIC Blood Culture adequate volume Performed at [facility], [address] [place], [place] [address]    Culture   Final    NO GROWTH 5 DAYS Performed at [facility], [address], [place], [place] [address]    Report Status 03/15/2023 FINAL  Final  Blood culture (routine x 2)     Status: None   Collection Time: 03/10/23  4:05 PM   Specimen: BLOOD  Result Value Ref Range Status   Specimen Description   Final    BLOOD RIGHT ANTECUBITAL Performed at [facility], [address], [place], [place] [address]    Special Requests   Final    BOTTLES DRAWN AEROBIC AND ANAEROBIC Blood Culture adequate volume Performed at [facility], [address] [place], [place] [address]    Culture   Final    NO GROWTH 5 DAYS Performed at [facility], [address], [place], [place] [address]    Report Status 03/15/2023 FINAL  Final  Surgical PCR screen     Status: Abnormal   Collection Time: 03/11/23 12:33 AM   Specimen: Nasal Mucosa; Nasal Swab  Result Value Ref Range Status   MRSA, PCR NEGATIVE NEGATIVE Final    Staphylococcus aureus POSITIVE (A) NEGATIVE Final    Comment: (NOTE) The Xpert SA Assay (FDA approved for NASAL specimens in patients 51 years of age and older), is one component of a comprehensive surveillance program. It is not intended to diagnose infection nor to guide or monitor treatment. Performed at [facility], [address], [place], [place] [address]          Radiology Studies: No results found.      Scheduled Meds:  vitamin C  1,000 mg Oral Daily   feeding supplement  237 mL Oral Q breakfast   fentaNYL (SUBLIMAZE) injection  50 mcg Intravenous Once   heparin  5,000 Units Subcutaneous Q8H   influenza vac split trivalent PF  0.5 mL Intramuscular Tomorrow-1000   insulin aspart  0-5 Units Subcutaneous QHS   insulin aspart  0-9 Units Subcutaneous TID WC   insulin aspart  5 Units Subcutaneous TID WC   insulin glargine-yfgn  28 Units Subcutaneous Daily   metroNIDAZOLE  500 mg Oral Q12H   midazolam  2 mg Intravenous Once   multivitamin with minerals  1 tablet Oral Daily   mupirocin ointment  1 Application Nasal BID   nutrition supplement (JUVEN)  1 packet Oral BID BM   pantoprazole  40 mg Oral Daily   zinc sulfate (50mg  elemental zinc)  220 mg Oral Daily   Continuous Infusions:  sodium chloride Stopped (03/14/23 2152)   ceFEPime (MAXIPIME) IV 2 g (03/15/23 0513)   magnesium sulfate bolus IVPB     vancomycin 1,000 mg (03/15/23 0551)          Glade Lloyd, MD [facility] 03/15/2023, 7:35 AM

## 2023-03-15 NOTE — Evaluation (Signed)
Physical Therapy Evaluation Patient Details Name: Jon House MRN: [id number] DOB: 06/24/1970 Today's Date: 03/15/2023  History of Present Illness  52 y/o male presents to [facility] 03/10/23 w/ L foot wound, odor and purulent drainage. 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 PMHx: DMT2, L foot infection, anemia   Clinical Impression  Pt in bed upon arrival and agreeable to PT eval. Prior to admission, pt ambulated short distances with RW or crutches. Pt would use WC in the community. In today's session, pt was able to stand and ambulate with a hop to gait pattern ~120 ft with supervision and RW. Pt presents to therapy session with decreased activity tolerance and mobility. Pt would benefit from acute skilled PT to address functional impairments. Recommending post-acute HHPT to help pt return to prior level of independence. Acute PT to follow.          If plan is discharge home, recommend the following: A little help with walking and/or transfers;Help with stairs or ramp for entrance;Assist for transportation   Can travel by private vehicle    Yes    Equipment Recommendations None recommended by PT (pt owns equipment)     Functional Status Assessment Patient has had a recent decline in their functional status and demonstrates the ability to make significant improvements in function in a reasonable and predictable amount of time.     Precautions / Restrictions Precautions Precautions: Fall Precaution Comments: L LE wound vac Required Braces or Orthoses: Other Brace Other Brace: L LE limb protecter Restrictions Weight Bearing Restrictions: Yes LLE Weight Bearing: Non weight bearing Other Position/Activity Restrictions: w/ limb protecter      Mobility  Bed Mobility Overal bed mobility: Modified Independent      General bed mobility comments: w/ HOB elevated    Transfers Overall transfer level: Needs assistance Equipment used: Rolling walker (2 wheels) Transfers: Sit to/from Stand (x2) Sit to  Stand: Supervision    General transfer comment: Supervision for safety, cues for hand placement w/ RW    Ambulation/Gait Ambulation/Gait assistance: Supervision Gait Distance (Feet): 120 Feet Assistive device: Rolling walker (2 wheels) Gait Pattern/deviations: Step-to pattern       General Gait Details: step to gait pattern, cues to push RW forward instead of lifting up. Steady     Balance Overall balance assessment: Needs assistance Sitting-balance support: No upper extremity supported, Feet supported Sitting balance-Leahy Scale: Good     Standing balance support: Bilateral upper extremity supported, During functional activity, Reliant on assistive device for balance Standing balance-Leahy Scale: Poor Standing balance comment: reliant on RW for stability         Pertinent Vitals/Pain Pain Assessment Pain Assessment: No/denies pain    Home Living Family/patient expects to be discharged to:: Private residence Living Arrangements: Spouse/significant other;Children Available Help at Discharge: Family;Available PRN/intermittently (spouse works during the day, kids are in school) Type of Home: House Home Access: Stairs to enter Entrance Stairs-Rails: None Secretary/administrator of Steps: 1 high step   Home Layout: One level Home Equipment: Agricultural consultant (2 wheels);Wheelchair - manual;Crutches;Other (comment);Shower seat - built in Archivist)      Prior Function Prior Level of Function : Independent/Modified Independent       Mobility Comments: used walker and crutches at home       Extremity/Trunk Assessment   Upper Extremity Assessment Upper Extremity Assessment: Defer to OT evaluation    Lower Extremity Assessment Lower Extremity Assessment: Overall WFL for tasks assessed (L BKA)    Cervical / Trunk Assessment Cervical /  Trunk Assessment: Normal  Communication   Communication Communication: No apparent difficulties (Spanish speaking) Cueing  Techniques: Verbal cues  Cognition Arousal: Alert Behavior During Therapy: WFL for tasks assessed/performed Overall Cognitive Status: Within Functional Limits for tasks assessed       General Comments General comments (skin integrity, edema, etc.): VSS on RA, limb protector donned upon arrival     PT Assessment Patient needs continued PT services  PT Problem List Decreased range of motion;Decreased activity tolerance;Decreased strength;Decreased balance;Decreased mobility;Decreased knowledge of use of DME       PT Treatment Interventions DME instruction;Gait training;Stair training;Therapeutic activities;Functional mobility training;Therapeutic exercise;Balance training;Neuromuscular re-education;Cognitive remediation;Patient/family education    PT Goals (Current goals can be found in the Care Plan section)  Acute Rehab PT Goals Patient Stated Goal: to go home PT Goal Formulation: With patient Time For Goal Achievement: 03/29/23 Potential to Achieve Goals: Good    Frequency Min 1X/week     Co-evaluation PT/OT/SLP Co-Evaluation/Treatment: Yes Reason for Co-Treatment: To address functional/ADL transfers PT goals addressed during session: Mobility/safety with mobility;Proper use of DME;Balance         AM-PAC PT "6 Clicks" Mobility  Outcome Measure Help needed turning from your back to your side while in a flat bed without using bedrails?: None Help needed moving from lying on your back to sitting on the side of a flat bed without using bedrails?: None Help needed moving to and from a bed to a chair (including a wheelchair)?: A Little Help needed standing up from a chair using your arms (e.g., wheelchair or bedside chair)?: A Little Help needed to walk in hospital room?: A Little Help needed climbing 3-5 steps with a railing? : A Lot 6 Click Score: 19    End of Session Equipment Utilized During Treatment: Gait belt (L LE limb protector) Activity Tolerance: Patient  tolerated treatment well Patient left: in chair;with call bell/phone within reach;with chair alarm set Nurse Communication: Mobility status PT Visit Diagnosis: Unsteadiness on feet (R26.81)    Time: 4540-9811 PT Time Calculation (min) (ACUTE ONLY): 42 min   Charges:   PT Evaluation $PT Eval Low Complexity: 1 Low   PT General Charges $$ ACUTE PT VISIT: 1 Visit         Hilton Cork, PT, DPT Secure Chat Preferred  Rehab Office [phone]   Arturo Morton Brion Aliment 03/15/2023, 9:44 AM

## 2023-03-15 NOTE — Evaluation (Signed)
Occupational Therapy Evaluation Patient Details Name: Jon House MRN: [id number] DOB: 11/05/1970 Today's Date: 03/15/2023   History of Present Illness 52 y/o male presents to [facility] 03/10/23 w/ L foot wound, odor and purulent drainage. 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 PMHx: DMT2, L foot infection, anemia   Clinical Impression   Pt currently at supervision/min guard assist level for selfcare tasks sit to stand and functional transfers.  Prior to admission he was modified independent with use of a RW, crutches, and occasional wheelchair for out in the community.  He lives with his spouse and children and will have some assist but will be alone for part of the day while they are at work and school.  Feel he will benefit from acute care OT at this time to help increase balance, safety, and greater independence with basic selfcare.  Feel he will be OK alone for intermittent periods of time and will benefit from increased wheelchair use initially for meal prep at home while family is away.  Recommend HHOT eval for safety to continue progression.        If plan is discharge home, recommend the following: A little help with walking and/or transfers;A little help with bathing/dressing/bathroom;Assist for transportation;Help with stairs or ramp for entrance    Functional Status Assessment  Patient has had a recent decline in their functional status and demonstrates the ability to make significant improvements in function in a reasonable and predictable amount of time.  Equipment Recommendations  Tub/shower bench;BSC/3in1       Precautions / Restrictions Precautions Precautions: Fall Precaution Comments: L LE wound vac Required Braces or Orthoses: Other Brace Other Brace: L LE limb protecter Restrictions Weight Bearing Restrictions: Yes LLE Weight Bearing: Non weight bearing Other Position/Activity Restrictions: w/ limb protecter      Mobility Bed Mobility Overal bed mobility: Modified Independent                   Transfers Overall transfer level: Needs assistance Equipment used: Rolling walker (2 wheels) Transfers: Sit to/from Stand, Bed to chair/wheelchair/BSC Sit to Stand: Supervision     Step pivot transfers: Contact guard assist     General transfer comment: Min instructional cueing for hand placement with sit to stand.      Balance Overall balance assessment: Needs assistance Sitting-balance support: No upper extremity supported, Feet supported Sitting balance-Leahy Scale: Good     Standing balance support: Bilateral upper extremity supported, During functional activity, Reliant on assistive device for balance Standing balance-Leahy Scale: Poor Standing balance comment: Pt needs RW for standing balance and functional ambulation                           ADL either performed or assessed with clinical judgement   ADL Overall ADL's : Needs assistance/impaired Eating/Feeding: Independent;Sitting   Grooming: Wash/dry hands;Supervision/safety;Standing   Upper Body Bathing: Set up;Sitting   Lower Body Bathing: Supervison/ safety;Sitting/lateral leans Lower Body Bathing Details (indicate cue type and reason): simulated Upper Body Dressing : Sitting   Lower Body Dressing: Sit to/from stand;Contact guard assist   Toilet Transfer: Contact guard assist;Ambulation;Comfort height toilet;Grab bars   Toileting- Clothing Manipulation and Hygiene: Contact guard assist;Sit to/from stand       Functional mobility during ADLs: Contact guard assist (ambulating with the RW) General ADL Comments: Pt has small shower seat but will need tub bench for home once vac is removed and MD ok's showering.  Will also benefit from 3:1 for  use over toilet or outside of the bathroom secondary to patient stating RW does not fit through the bathroom door easily.     Vision Baseline Vision/History: 0 No visual deficits Ability to See in Adequate Light: 0 Adequate Patient  Visual Report: No change from baseline Vision Assessment?: No apparent visual deficits     Perception Perception: Within Functional Limits       Praxis Praxis: WFL       Pertinent Vitals/Pain Pain Assessment Pain Assessment: Faces Faces Pain Scale: Hurts a little bit Pain Location: left residual limb Pain Descriptors / Indicators: Discomfort Pain Intervention(s): Limited activity within patient's tolerance     Extremity/Trunk Assessment Upper Extremity Assessment Upper Extremity Assessment: Overall WFL for tasks assessed (Noted right hand muscle wasting but did not affect functional use.  Pt reports numbness in bilateral digits as well.)   Lower Extremity Assessment Lower Extremity Assessment: Defer to PT evaluation   Cervical / Trunk Assessment Cervical / Trunk Assessment: Normal   Communication Communication Communication: No apparent difficulties Cueing Techniques: Verbal cues   Cognition Arousal: Alert Behavior During Therapy: WFL for tasks assessed/performed Overall Cognitive Status: Within Functional Limits for tasks assessed                                 General Comments: Interpreter utilized     General Comments  VSS on RA, limb protector donned upon arrival            Home Living Family/patient expects to be discharged to:: Private residence Living Arrangements: Spouse/significant other;Children Available Help at Discharge: Family;Available PRN/intermittently (spouse works during the day, kids are in school) Type of Home: House Home Access: Stairs to enter [employer] of Steps: 1 high step Entrance Stairs-Rails: None Home Layout: One level     Bathroom Shower/Tub: Chief Strategy Officer: Standard     Home Equipment: Agricultural consultant (2 wheels);Wheelchair - manual;Crutches;Other (comment);Shower seat - built in Archivist)          Prior Functioning/Environment Prior Level of Function :  Independent/Modified Independent             Mobility Comments: used walker and crutches at home          OT Problem List: Impaired balance (sitting and/or standing);Decreased activity tolerance;Decreased knowledge of use of DME or AE;Pain      OT Treatment/Interventions: Self-care/ADL training;DME and/or AE instruction;Therapeutic activities;Balance training;Therapeutic exercise;Patient/family education    OT Goals(Current goals can be found in the care plan section) Acute Rehab OT Goals Patient Stated Goal: He wants to get back to walking. OT Goal Formulation: With patient Time For Goal Achievement: 03/29/23 Potential to Achieve Goals: Good  OT Frequency: Min 1X/week    Co-evaluation   Reason for Co-Treatment: To address functional/ADL transfers PT goals addressed during session: Mobility/safety with mobility;Proper use of DME;Balance        AM-PAC OT "6 Clicks" Daily Activity     Outcome Measure Help from another person eating meals?: None Help from another person taking care of personal grooming?: A Little Help from another person toileting, which includes using toliet, bedpan, or urinal?: A Little Help from another person bathing (including washing, rinsing, drying)?: A Little Help from another person to put on and taking off regular upper body clothing?: A Little Help from another person to put on and taking off regular lower body clothing?: A Little 6 Click Score: 19  End of Session Equipment Utilized During Treatment: Gait belt;Rolling walker (2 wheels);Other (comment) (LLE limb guard) Nurse Communication: Mobility status  Activity Tolerance: Patient tolerated treatment well Patient left: in chair;with call bell/phone within reach;with chair alarm set  OT Visit Diagnosis: Unsteadiness on feet (R26.81);Other abnormalities of gait and mobility (R26.89);Pain Pain - Right/Left: Left Pain - part of body: Leg                Time: 2440-1027 OT Time Calculation  (min): 43 min Charges:  OT General Charges $OT Visit: 1 Visit OT Evaluation $OT Eval Moderate Complexity: 1 Mod Perrin Maltese, OTR/L Acute Rehabilitation Services  Office [phone] 03/15/2023

## 2023-03-15 NOTE — Anesthesia Postprocedure Evaluation (Signed)
Anesthesia Post Note  Patient: Jon House  Procedure(s) Performed: LEFT BELOW KNEE AMPUTATION (Left: Knee) APPLICATION OF WOUND VAC (Left: Knee)     Anesthesia Type: General Anesthetic complications: no   No notable events documented.  Last Vitals:  Vitals:   03/15/23 0553 03/15/23 1635  BP: 129/70 129/77  Pulse: 84 88  Resp: 18 17  Temp: 36.8 C 36.6 C  SpO2: 91% 95%    Last Pain:  Vitals:   03/15/23 1635  TempSrc: Oral  PainSc:                  Mariann Barter

## 2023-03-16 LAB — SURGICAL PATHOLOGY

## 2023-03-16 LAB — GLUCOSE, CAPILLARY
Glucose-Capillary: 198 mg/dL — ABNORMAL HIGH (ref 70–99)
Glucose-Capillary: 165 mg/dL — ABNORMAL HIGH (ref 70–99)

## 2023-03-16 MED ORDER — METHOCARBAMOL 500 MG PO TABS: 500.0000 mg | ORAL_TABLET | Freq: Four times a day (QID) | ORAL | 0 refills | Status: AC | PRN

## 2023-03-16 MED ORDER — OXYCODONE-ACETAMINOPHEN 5-325 MG PO TABS: 1.0000 | ORAL_TABLET | ORAL | 0 refills | Status: DC | PRN

## 2023-03-16 MED ORDER — ZINC SULFATE 220 (50 ZN) MG PO CAPS: 220.0000 mg | ORAL_CAPSULE | Freq: Every day | ORAL | 0 refills | Status: AC

## 2023-03-16 MED ORDER — SENNOSIDES-DOCUSATE SODIUM 8.6-50 MG PO TABS: 1.0000 | ORAL_TABLET | Freq: Two times a day (BID) | ORAL | 0 refills | Status: AC

## 2023-03-16 MED ORDER — HUMULIN 70/30 (70-30) 100 UNIT/ML ~~LOC~~ SUSP: 15.0000 [IU] | Freq: Two times a day (BID) | SUBCUTANEOUS | 0 refills | Status: AC

## 2023-03-16 MED ORDER — ACETAMINOPHEN 500 MG PO TABS: 1000.0000 mg | ORAL_TABLET | Freq: Three times a day (TID) | ORAL | Status: AC

## 2023-03-16 MED ORDER — ASCORBIC ACID 1000 MG PO TABS: 1000.0000 mg | ORAL_TABLET | Freq: Every day | ORAL | 0 refills | Status: AC

## 2023-03-16 MED ORDER — KETOROLAC TROMETHAMINE 15 MG/ML IJ SOLN: 15.0000 mg | Freq: Four times a day (QID) | INTRAMUSCULAR | Status: DC

## 2023-03-16 NOTE — Plan of Care (Signed)
  Problem: Education: Goal: Ability to describe self-care measures that may prevent or decrease complications (Diabetes Survival Skills Education) will improve Outcome: Progressing Goal: Individualized Educational Video(s) Outcome: Progressing   

## 2023-03-16 NOTE — Discharge Summary (Signed)
Physician Discharge Summary  Gabrel Aujla WRU:[id number] DOB: [birth year] DOA: 03/10/2023  PCP: Patient, No Pcp Per  Admit date: 03/10/2023 Discharge date: 03/16/2023  Admitted From: Home Disposition: Home  Recommendations for Outpatient Follow-up:  Follow up with PCP in 1 week with repeat CBC/BMP Outpatient follow-up with orthopedics.  Discharge wound/wound VAC care/pain medication as per orthopedics recommendations Follow up in ED if symptoms worsen or new appear   Home Health: Home health PT  equipment/Devices: None  Discharge Condition: Stable CODE STATUS: Full Diet recommendation: Heart healthy/carb modified  Brief/Interim Summary: 52 y.o. male with past medical history significant for DM2, history of osteomyelitis/recurrent diabetic foot infections s/p right/left TMA with recent revision in April 2024 presented with progressive left foot wound with foul odor/drainage with fever, nausea and vomiting.  Workup suggested possible left foot osteomyelitis with x-ray showing possible osseous demineralization of the fifth metatarsal with surrounding soft tissue swelling.  He was started on broad-spectrum antibiotics.  Orthopedics was consulted.  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  PT subsequently recommended home health PT.  He will be discharged home today with outpatient follow-up with PCP and orthopedics.  Discharge Diagnoses:   Acute left foot osteomyelitis -Has history of diabetic foot infections and osteomyelitis status post TMA -Currently on broad-spectrum antibiotics.   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  Pain management as per orthopedics.  DC'd antibiotics on 03/15/2023 as per orthopedics recommendations. -PT at home and PT. -He will be discharged home today with outpatient follow-up with PCP and orthopedics.  -Leukocytosis -Resolved    Anemia of chronic disease -From chronic illnesses.  Hemoglobin currently stable.   Diabetes mellitus type 2 with hyperglycemia--carb modified diet.  Outpatient follow-up with PCP.  He will resume 70/30 insulin at home at a higher dose   hyponatremia -Mild. Labs pending today.  Encourage oral intake.  Outpatient follow-up  Leukocytosis -Mild.  Possibly reactive  Thrombocytosis -Possibly reactive.    Discharge Instructions  Discharge Instructions     Diet Carb Modified   Complete by: As directed    Discharge wound care:   Complete by: As directed    As per Dr. Audrie Lia recommendations   Increase activity slowly   Complete by: As directed       Allergies as of 03/16/2023       Reactions   Bee Venom Hives   Metformin And Related Diarrhea        Medication List     STOP taking these medications    doxycycline 100 MG tablet Commonly known as: VIBRA-TABS   ibuprofen 200 MG tablet Commonly known as: ADVIL       TAKE these medications    acetaminophen 500 MG tablet Commonly known as: TYLENOL Take 2 tablets (1,000 mg total) by mouth every 8 (eight) hours.   ascorbic acid 1000 MG tablet Commonly known as: VITAMIN C Take 1 tablet (1,000 mg total) by mouth daily. Start taking on: March 17, 2023   HumuLIN 70/30 (70-30) 100 UNIT/ML injection Generic drug: insulin NPH-regular Human Inject 15 Units into the skin 2 (two) times daily with a meal.   methocarbamol 500 MG tablet Commonly known as: ROBAXIN Take 1 tablet (500 mg total) by mouth every 6 (six) hours as needed for muscle spasms.   senna-docusate 8.6-50 MG tablet Commonly known as: Senokot-S Take 1 tablet by mouth 2 (two) times daily.   zinc  sulfate (50mg  elemental zinc) 220 (50 Zn) MG capsule Take 1 capsule (220 mg total) by mouth daily. Start taking on: March 17, 2023               Durable Medical Equipment  (From admission, onward)           Start     Ordered   03/15/23  1311  For home use only DME Tub bench  Once        03/15/23 1310              Discharge Care Instructions  (From admission, onward)           Start     Ordered   03/16/23 0000  Discharge wound care:       Comments: As per Dr. Audrie Lia recommendations   03/16/23 0981            Follow-up Information     Kathleen Lime, MD Follow up.   Specialty: Internal Medicine Why: Follow up appointment April 10, 2023 at 1015 am  Please take ID and $25.00 Contact information: [address] [place] [address] [phone]         [name], MD Follow up in 1 week(s).   Specialty: Orthopedic Surgery Contact information: [address] [place] [address] [phone]                Allergies  Allergen Reactions   Bee Venom Hives   Metformin And Related Diarrhea    Consultations: Orthopedics   Procedures/Studies: VAS [place] ABI WITH/WO TBI  Result Date: 03/12/2023  LOWER EXTREMITY DOPPLER STUDY Patient Name:  AQUARIUS GEISSLER  Date of Exam:   03/11/2023 Medical Rec #: [id number]             Accession #:    [id number] Date of Birth: [birth year]             Patient Gender: M Patient Age:   88 years Exam Location:  [facility] Procedure:      VAS [place] ABI WITH/WO TBI Referring Phys: TIMOTHY OPYD --------------------------------------------------------------------------------  Indications: DM foot infection High Risk Factors: Diabetes, past history of smoking.  Vascular Interventions: LLE TMA, RLE great toe amputation. Comparison Study: Previous exam 07/29/2018 Performing Technologist: Ernestene Mention RVT/RDMS  Examination Guidelines: A complete evaluation includes at minimum, Doppler waveform signals and systolic blood pressure reading at the level of bilateral brachial, anterior tibial, and posterior tibial arteries, when vessel segments are accessible. Bilateral testing is considered an integral part of a complete examination. Photoelectric Plethysmograph  (PPG) waveforms and toe systolic pressure readings are included as required and additional duplex testing as needed. Limited examinations for reoccurring indications may be performed as noted.  ABI Findings: +--------+------------------+-----+---------+--------+ Right   Rt Pressure (mmHg)IndexWaveform Comment  +--------+------------------+-----+---------+--------+ [id number]                    triphasic         +--------+------------------+-----+---------+--------+ PTA     169               1.26 biphasic          +--------+------------------+-----+---------+--------+ DP      181               1.35 triphasic         +--------+------------------+-----+---------+--------+ +--------+------------------+-----+---------+-------+ Left    Lt Pressure (mmHg)IndexWaveform Comment +--------+------------------+-----+---------+-------+ [id number]  triphasic        +--------+------------------+-----+---------+-------+ PTA     183               1.37 triphasic        +--------+------------------+-----+---------+-------+ DP      187               1.40 triphasic        +--------+------------------+-----+---------+-------+ +-------+-----------+-----------+------------+------------+ ABI/TBIToday's ABIToday's TBIPrevious ABIPrevious TBI +-------+-----------+-----------+------------+------------+ Right  1.35       amp        1.18                     +-------+-----------+-----------+------------+------------+ Left   1.37       amp        1.21                     +-------+-----------+-----------+------------+------------+  Arterial wall calcification precludes accurate ankle pressures and ABIs.  Summary: Right: Resting right ankle-brachial index indicates noncompressible right lower extremity arteries. Left: Resting left ankle-brachial index indicates noncompressible left lower extremity arteries. *See table(s) above for measurements and observations.   Electronically signed by Carolynn Sayers on 03/12/2023 at 4:35:43 PM.    Final    DG Chest 2 View  Result Date: 03/10/2023 CLINICAL DATA:  Chest pain. EXAM: CHEST - 2 VIEW COMPARISON:  None Available. FINDINGS: The heart size and mediastinal contours are within normal limits. There is mild bibasilar atelectasis/airspace disease. No pleural effusion or pneumothorax. The visualized skeletal structures are unremarkable. IMPRESSION: Mild bibasilar atelectasis/airspace disease. Electronically Signed   By: Romona Curls M.D.   On: 03/10/2023 14:53   DG Foot Complete Left  Result Date: 03/10/2023 CLINICAL DATA:  Foot pain, concern for osteomyelitis. EXAM: LEFT FOOT - COMPLETE 3+ VIEW COMPARISON:  Foot radiograph dated 02/20/2023. FINDINGS: The patient is status post foot amputation at the level of the metatarsals. No acute fracture is identified. There is osseous demineralization of the fifth metatarsal with surrounding soft tissue swelling, increased since 02/20/2023. IMPRESSION: Osseous demineralization of the fifth metatarsal with surrounding soft tissue swelling, concerning for osteomyelitis. Electronically Signed   By: Romona Curls M.D.   On: 03/10/2023 14:50   XR Foot Complete Left  Result Date: 02/20/2023 Three-view radiographs of the left foot shows the silver nitrate going down to bone at the first fourth and fifth metatarsal.  There is destructive bony changes at these 3 locations consistent with early osteomyelitis.     Subjective: Patient seen and examined at bedside.  Complains of left lower extremity pain but feels okay to go home today.  No fever, vomiting, chest pain reported.  Discharge Exam: Vitals:   03/16/23 0746 03/16/23 0748  BP: 123/76   Pulse: 89 91  Resp: 18   Temp: 98 F (36.7 C)   SpO2: 93% 92%    General: Pt is alert, awake, not in acute distress.  On room air. Cardiovascular: rate controlled, S1/S2 + Respiratory: bilateral decreased breath sounds at  bases Abdominal: Soft, NT, ND, bowel sounds + Extremities: Left BKA with wound VAC present  The results of significant diagnostics from this hospitalization (including imaging, microbiology, ancillary and laboratory) are listed below for reference.     Microbiology: Recent Results (from the past 240 hour(s))  Blood culture (routine x 2)     Status: None   Collection Time: 03/10/23  4:03 PM   Specimen: BLOOD  Result Value Ref Range Status   Specimen Description  Final    BLOOD LEFT ANTECUBITAL Performed at [facility], [address], [place], [place] [address]    Special Requests   Final    BOTTLES DRAWN AEROBIC AND ANAEROBIC Blood Culture adequate volume Performed at [facility], [address], [place], [place] [address]    Culture   Final    NO GROWTH 5 DAYS Performed at [facility], [address], [place], [place] [address]    Report Status 03/15/2023 FINAL  Final  Blood culture (routine x 2)     Status: None   Collection Time: 03/10/23  4:05 PM   Specimen: BLOOD  Result Value Ref Range Status   Specimen Description   Final    BLOOD RIGHT ANTECUBITAL Performed at [facility], [address] [place], [place] [address]    Special Requests   Final    BOTTLES DRAWN AEROBIC AND ANAEROBIC Blood Culture adequate volume Performed at [facility], [address] [place], [place] [address]    Culture   Final    NO GROWTH 5 DAYS Performed at [facility], [address] [place], [place] [address]    Report Status 03/15/2023 FINAL  Final  Surgical PCR screen     Status: Abnormal   Collection Time: 03/11/23 12:33 AM   Specimen: Nasal Mucosa; Nasal Swab  Result Value Ref Range Status   MRSA, PCR NEGATIVE NEGATIVE Final   Staphylococcus aureus POSITIVE (A) NEGATIVE Final    Comment: (NOTE) The Xpert SA Assay (FDA approved for NASAL specimens in patients 71 years of age and older),  is one component of a comprehensive surveillance program. It is not intended to diagnose infection nor to guide or monitor treatment. Performed at [facility], [address], [place], [place] [address]      Labs: BNP (last 3 results) No results for input(s): "BNP" in the last 8760 hours. Basic Metabolic Panel: Recent Labs  Lab 03/11/23 0602 03/12/23 0720 03/13/23 0636 03/14/23 0534 03/15/23 0956  NA 135 132* 133* 134* 128*  K 3.8 4.2 4.2 4.0 4.2  CL 102 99 97* 98 97*  CO2 23 24 24 27 23   GLUCOSE 228* 262* 251* 202* 382*  BUN 19 15 18  23* 20  CREATININE 0.78 0.68 0.72 0.74 0.93  CALCIUM 8.5* 8.6* 8.7* 9.2 8.4*  MG  --   --   --   --  2.0   Liver Function Tests: Recent Labs  Lab 03/10/23 1426  AST 11*  ALT 14  ALKPHOS 81  BILITOT 0.4  PROT 7.6  ALBUMIN 3.0*   No results for input(s): "LIPASE", "AMYLASE" in the last 168 hours. No results for input(s): "AMMONIA" in the last 168 hours. CBC: Recent Labs  Lab 03/10/23 1426 03/11/23 0602 03/12/23 0720 03/13/23 0636 03/14/23 0534 03/15/23 0956  WBC 12.4* 7.7 6.0 6.1 6.9 12.0*  NEUTROABS 10.6*  --   --   --   --  10.4*  HGB 9.4* 9.7* 10.4* 10.7* 11.2* 10.9*  HCT 27.6* 29.4* 31.4* 31.1* 34.2* 32.6*  MCV 88.2 87.0 87.0 86.1 86.4 86.7  PLT 303 313 364 355 392 430*   Cardiac Enzymes: No results for input(s): "CKTOTAL", "CKMB", "CKMBINDEX", "TROPONINI" in the last 168 hours. BNP: Invalid input(s): "POCBNP" CBG: Recent Labs  Lab 03/15/23 0854 03/15/23 1216 03/15/23 1632 03/15/23 2058 03/16/23 0748  GLUCAP 353* 260* 146* 114* 198*   D-Dimer No results for input(s): "DDIMER"  in the last 72 hours. Hgb A1c Recent Labs    03/14/23 1506  HGBA1C 10.6*   Lipid Profile No results for input(s): "CHOL", "HDL", "LDLCALC", "TRIG", "CHOLHDL", "LDLDIRECT" in the last 72 hours. Thyroid function studies No results for input(s): "TSH", "T4TOTAL", "T3FREE", "THYROIDAB" in the last 72 hours.  Invalid input(s):  "FREET3" Anemia work up No results for input(s): "VITAMINB12", "FOLATE", "FERRITIN", "TIBC", "IRON", "RETICCTPCT" in the last 72 hours. Urinalysis    Component Value Date/Time   COLORURINE YELLOW 03/10/2023 1605   APPEARANCEUR HAZY (A) 03/10/2023 1605   LABSPEC 1.020 03/10/2023 1605   PHURINE 6.0 03/10/2023 1605   GLUCOSEU >=500 (A) 03/10/2023 1605   HGBUR NEGATIVE 03/10/2023 1605   BILIRUBINUR NEGATIVE 03/10/2023 1605   KETONESUR NEGATIVE 03/10/2023 1605   PROTEINUR 30 (A) 03/10/2023 1605   NITRITE NEGATIVE 03/10/2023 1605   LEUKOCYTESUR NEGATIVE 03/10/2023 1605   Sepsis Labs Recent Labs  Lab 03/12/23 0720 03/13/23 0636 03/14/23 0534 03/15/23 0956  WBC 6.0 6.1 6.9 12.0*   Microbiology Recent Results (from the past 240 hour(s))  Blood culture (routine x 2)     Status: None   Collection Time: 03/10/23  4:03 PM   Specimen: BLOOD  Result Value Ref Range Status   Specimen Description   Final    BLOOD LEFT ANTECUBITAL Performed at [facility], [address] [place], [place] [address]    Special Requests   Final    BOTTLES DRAWN AEROBIC AND ANAEROBIC Blood Culture adequate volume Performed at [facility], [address], [place], [place] [address]    Culture   Final    NO GROWTH 5 DAYS Performed at [facility], [address], [place], [place] [address]    Report Status 03/15/2023 FINAL  Final  Blood culture (routine x 2)     Status: None   Collection Time: 03/10/23  4:05 PM   Specimen: BLOOD  Result Value Ref Range Status   Specimen Description   Final    BLOOD RIGHT ANTECUBITAL Performed at [facility], [address] [place], [place] [address]    Special Requests   Final    BOTTLES DRAWN AEROBIC AND ANAEROBIC Blood Culture adequate volume Performed at [facility], [address], [place], [place] [address]    Culture   Final    NO GROWTH 5 DAYS Performed at [facility], [address], [place], [place] [address]    Report Status 03/15/2023 FINAL  Final  Surgical PCR screen     Status: Abnormal   Collection Time: 03/11/23 12:33 AM   Specimen: Nasal Mucosa; Nasal Swab  Result Value Ref Range Status   MRSA, PCR NEGATIVE NEGATIVE Final   Staphylococcus aureus POSITIVE (A) NEGATIVE Final    Comment: (NOTE) The Xpert SA Assay (FDA approved for NASAL specimens in patients 24 years of age and older), is one component of a comprehensive surveillance program. It is not intended to diagnose infection nor to guide or monitor treatment. Performed at [facility], [address] [place], [place] [address]      Time coordinating discharge: 35 minutes  SIGNED:   Glade Lloyd, MD  [facility] 03/16/2023, 9:55 AM

## 2023-03-16 NOTE — Progress Notes (Signed)
Physical Therapy Treatment Patient Details Name: Jon House MRN: [id number] DOB: [birth year] Today's Date: 03/16/2023   History of Present Illness 52 y/o male presents to [facility] 03/10/23 w/ L foot wound, odor and purulent drainage. 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 PMHx: DMT2, L foot infection, anemia    PT Comments    Pt in bed upon arrival with limb protector donned and agreeable to PT session. Worked on Psychologist, counselling w/ pt able to clear step x2 with RW and CGA. Educated pt on having someone close by to guard and hold RW steady. Pt completed amputee exercises with emphasis on knee ROM multiple times through the day. Also educated pt on providing tactile stimulation to end of limb. Pt feels safe with returning home and has no further mobility questions. Acute PT to follow.       Video Interpreter: Vernona Rieger [phone]    If plan is discharge home, recommend the following: A little help with walking and/or transfers;Help with stairs or ramp for entrance;Assist for transportation   Can travel by private vehicle      Yes  Equipment Recommendations  None recommended by PT (pt owns equipment)       Precautions / Restrictions Precautions Precautions: Fall Precaution Comments: L LE wound vac Required Braces or Orthoses: Other Brace Other Brace: L LE limb protecter Restrictions Weight Bearing Restrictions: Yes LLE Weight Bearing: Non weight bearing Other Position/Activity Restrictions: w/ limb protecter     Mobility  Bed Mobility Overal bed mobility: Modified Independent             General bed mobility comments: w/ HOB elevated    Transfers Overall transfer level: Needs assistance Equipment used: Rolling walker (2 wheels) Transfers: Sit to/from Stand (x2) Sit to Stand: Supervision           General transfer comment: Supervision for safety, cues for hand placement w/ RW w/ good carry over    Ambulation/Gait Ambulation/Gait assistance: Supervision Gait Distance (Feet): 80  Feet Assistive device: Rolling walker (2 wheels) Gait Pattern/deviations: Step-to pattern       General Gait Details: step to gait pattern, slow and steady   Stairs Stairs: Yes Stairs assistance: Contact guard assist Stair Management: No rails, Forwards, Backwards, With walker Number of Stairs: 2 General stair comments: pt able to clear step with CGA assist and blocking of RW. Instructed pt to have someone close by and holding the RW still.        Balance Overall balance assessment: Needs assistance Sitting-balance support: No upper extremity supported, Feet supported Sitting balance-Leahy Scale: Good     Standing balance support: Bilateral upper extremity supported, During functional activity, Reliant on assistive device for balance Standing balance-Leahy Scale: Poor Standing balance comment: reliant on RW for stability         Cognition Arousal: Alert Behavior During Therapy: WFL for tasks assessed/performed Overall Cognitive Status: Within Functional Limits for tasks assessed         Exercises Amputee Exercises Hip ABduction/ADduction: AROM, Left, 10 reps, Seated Hip Flexion/Marching: AROM, Left, 10 reps, Seated Knee Flexion: AROM, Left, 15 reps, Seated Knee Extension: AROM, Left, 15 reps, Seated Straight Leg Raises: AROM, Left, 10 reps, Seated    General Comments General comments (skin integrity, edema, etc.): VSS on RA      Pertinent Vitals/Pain Pain Assessment Pain Assessment: No/denies pain     PT Goals (current goals can now be found in the care plan section) Acute Rehab PT Goals Patient Stated Goal: to go home PT  Goal Formulation: With patient Time For Goal Achievement: 03/29/23 Potential to Achieve Goals: Good Progress towards PT goals: Progressing toward goals    Frequency    Min 1X/week       AM-PAC PT "6 Clicks" Mobility   Outcome Measure  Help needed turning from your back to your side while in a flat bed without using  bedrails?: None Help needed moving from lying on your back to sitting on the side of a flat bed without using bedrails?: None Help needed moving to and from a bed to a chair (including a wheelchair)?: A Little Help needed standing up from a chair using your arms (e.g., wheelchair or bedside chair)?: A Little Help needed to walk in hospital room?: A Little Help needed climbing 3-5 steps with a railing? : A Lot 6 Click Score: 19    End of Session Equipment Utilized During Treatment: Gait belt (L LE limb protector) Activity Tolerance: Patient tolerated treatment well Patient left: in chair;with call bell/phone within reach;with chair alarm set Nurse Communication: Mobility status PT Visit Diagnosis: Unsteadiness on feet (R26.81)     Time: 0102-7253 PT Time Calculation (min) (ACUTE ONLY): 26 min  Charges:    $Therapeutic Exercise: 8-22 mins $Therapeutic Activity: 8-22 mins PT General Charges $$ ACUTE PT VISIT: 1 Visit                     Hilton Cork, PT, DPT Secure Chat Preferred  Rehab Office [phone]    Arturo Morton Brion Aliment 03/16/2023, 10:25 AM

## 2023-03-16 NOTE — Progress Notes (Signed)
Occupational Therapy Treatment Patient Details Name: Jon House MRN: [id number] DOB: [birth year] Today's Date: 03/16/2023   History of present illness 52 y/o male presents to [facility] 03/10/23 w/ L foot wound, odor and purulent drainage. 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 PMHx: DMT2, L foot infection, anemia   OT comments  Pt currently at supervision level for toilet transfers using the RW and 3:1 and for grooming tasks in standing at the sink.  Making good progress with education on use of DME and completion of simple meal prep with use of the RW and wheelchair.  Feel he will continue to benefit from acute care OT until medically ready for discharge.  Recommend acute HHOT eval at home to progress to modified independent level as pt will be alone for some periods of time initially.       If plan is discharge home, recommend the following:  A little help with walking and/or transfers;A little help with bathing/dressing/bathroom;Assist for transportation;Help with stairs or ramp for entrance   Equipment Recommendations  Tub/shower bench;BSC/3in1       Precautions / Restrictions Precautions Precautions: Fall Precaution Comments: L LE wound vac Required Braces or Orthoses: Other Brace Other Brace: L LE limb protecter Restrictions Weight Bearing Restrictions: Yes LLE Weight Bearing: Non weight bearing Other Position/Activity Restrictions: w/ limb protecter       Mobility Bed Mobility                    Transfers Overall transfer level: Needs assistance Equipment used: Rolling walker (2 wheels) Transfers: Sit to/from Stand Sit to Stand: Supervision     Step pivot transfers: Supervision     General transfer comment: Min instructional cueing for hand placement with RW usage during sit to stand and stand to sit transitions.     Balance Overall balance assessment: Needs assistance Sitting-balance support: No upper extremity supported, Feet supported Sitting balance-Leahy Scale:  Good     Standing balance support: Bilateral upper extremity supported, During functional activity, Reliant on assistive device for balance Standing balance-Leahy Scale: Poor Standing balance comment: Pt needs UE support on RW for balance.                           ADL either performed or assessed with clinical judgement   ADL Overall ADL's : Needs assistance/impaired     Grooming: Supervision/safety;Standing;Oral care   Upper Body Bathing: Set up;Sitting           Lower Body Dressing: Supervision/safety;Sit to/from stand   Toilet Transfer: Supervision/safety;Ambulation;BSC/3in1   Toileting- Architect and Hygiene: Supervision/safety;Sit to/from stand       Functional mobility during ADLs: Supervision/safety;Rolling walker (2 wheels) General ADL Comments: Provided education on tub bench use with integration of video and interpreter present.  Also, provided education in person on simple meal prep with use of the RW and wheelchair.  Therapist recommends wheelchair usage if family is not present.  Discussed sequencing for bathing and dressing using tub bench and to sit for all bathing and dry off before exiting the tub on the bench.  Recommend use of a bag on the walker as well to help with transferring items.  Pt voices understanding through interpreter.      Cognition Arousal: Alert Behavior During Therapy: WFL for tasks assessed/performed Overall Cognitive Status: Within Functional Limits for tasks assessed  General Comments: Interpreter utilized              General Comments VSS on RA    Pertinent Vitals/ Pain       Pain Assessment Pain Assessment: Faces Faces Pain Scale: Hurts a little bit Pain Location: left residual limb Pain Descriptors / Indicators: Discomfort Pain Intervention(s): Limited activity within patient's tolerance, Monitored during session, Repositioned         Frequency   Min 1X/week        Progress Toward Goals  OT Goals(current goals can now be found in the care plan section)  Progress towards OT goals: Progressing toward goals  Acute Rehab OT Goals Patient Stated Goal: Pt hopes to go home soon OT Goal Formulation: With patient Time For Goal Achievement: 03/29/23 Potential to Achieve Goals: Good  Plan         AM-PAC OT "6 Clicks" Daily Activity     Outcome Measure   Help from another person eating meals?: None Help from another person taking care of personal grooming?: A Little Help from another person toileting, which includes using toliet, bedpan, or urinal?: A Little Help from another person bathing (including washing, rinsing, drying)?: A Little Help from another person to put on and taking off regular upper body clothing?: A Little Help from another person to put on and taking off regular lower body clothing?: A Little 6 Click Score: 19    End of Session Equipment Utilized During Treatment: Gait belt;Rolling walker (2 wheels);Other (comment)  OT Visit Diagnosis: Unsteadiness on feet (R26.81);Other abnormalities of gait and mobility (R26.89);Pain Pain - Right/Left: Left Pain - part of body: Leg   Activity Tolerance Patient tolerated treatment well   Patient Left in chair;with call bell/phone within reach;with chair alarm set   Nurse Communication Mobility status        Time: 1914-7829 OT Time Calculation (min): 40 min  Charges: OT General Charges $OT Visit: 1 Visit OT Treatments $Self Care/Home Management : 38-52 mins  Perrin Maltese, OTR/L Acute Rehabilitation Services  Office [phone] 03/16/2023

## 2023-03-19 ENCOUNTER — Ambulatory Visit: Payer: No Typology Code available for payment source | Admitting: Orthopedic Surgery

## 2023-03-22 ENCOUNTER — Encounter: Payer: Self-pay | Admitting: Family

## 2023-03-22 ENCOUNTER — Ambulatory Visit (INDEPENDENT_AMBULATORY_CARE_PROVIDER_SITE_OTHER): Payer: Self-pay | Admitting: Family

## 2023-03-22 DIAGNOSIS — T8781 Dehiscence of amputation stump: Secondary | ICD-10-CM

## 2023-03-22 MED ORDER — OXYCODONE-ACETAMINOPHEN 5-325 MG PO TABS: 1.0000 | ORAL_TABLET | Freq: Four times a day (QID) | ORAL | 0 refills | Status: AC | PRN

## 2023-03-22 NOTE — Progress Notes (Signed)
Post-Op Visit Note   Patient: Jon House           Date of Birth: 01/02/1971           MRN: [id number] Visit Date: 03/22/2023 PCP: Patient, No Pcp Per  Chief Complaint:  Chief Complaint  Patient presents with   Left Leg - Routine Post Op    03/14/23 left BKA    HPI:  HPI The patient is a 52 year old gentleman who is seen status post left below-knee amputation Ortho Exam Incision well-approximated staples there is scant bloody drainage no erythema no dehiscence  Visit Diagnoses: No diagnosis found.  Plan: Begin daily dose of cleansing.  Dry dressings.  Shrinker around-the-clock.  Follow-Up Instructions: No follow-ups on file.   Imaging: No results found.  Orders:  No orders of the defined types were placed in this encounter.  No orders of the defined types were placed in this encounter.    PMFS History: Patient Active Problem List   Diagnosis Date Noted   Protein-calorie malnutrition, severe 03/12/2023   Insulin dependent type 2 diabetes mellitus (HCC) 03/10/2023   Foot osteomyelitis, left (HCC) 03/10/2023   Normocytic anemia 03/10/2023   Dehiscence of amputation stump of left lower extremity (HCC) 08/16/2022   Subacute osteomyelitis, right ankle and foot (HCC)    Osteomyelitis of great toe of right foot (HCC)    History of amputation of lesser toe of left foot (HCC) 10/16/2018   Subacute osteomyelitis, left ankle and foot (HCC)    Diabetic infection of left foot (HCC) 10/04/2018   Diabetic infection of right foot (HCC) 10/04/2018   Diabetic polyneuropathy associated with type 2 diabetes mellitus (HCC)    Past Medical History:  Diagnosis Date   Diabetes mellitus without complication (HCC)    Type II   Infection 10/04/2018   LEFT FOOT   Osteomyelitis of great toe of left foot (HCC) 07/29/2018   Subacute osteomyelitis, left ankle and foot (HCC)     Family History  Problem Relation Age of Onset   Diabetes Mother     Past Surgical History:   Procedure Laterality Date   AMPUTATION Left 07/31/2018   Procedure: LEFT GREAT TOE AMPUTATION;  Surgeon: [name], MD;  Location: [facility] OR;  Service: Orthopedics;  Laterality: Left;   AMPUTATION Left 10/09/2018   Procedure: LEFT FOOT 2ND TOE AND POSSIBLE 3RD TOE AMPUTATION;  Surgeon: [name], MD;  Location: [facility] OR;  Service: Orthopedics;  Laterality: Left;   AMPUTATION Right 03/17/2020   Procedure: RIGHT GREAT TOE AMPUTATION THROUGH METATARSAL;  Surgeon: [name], MD;  Location: [facility] OR;  Service: Orthopedics;  Laterality: Right;   AMPUTATION Left 10/22/2020   Procedure: LEFT TRANSMETATARSAL AMPUTATION;  Surgeon: [name], MD;  Location: [facility] OR;  Service: Orthopedics;  Laterality: Left;   AMPUTATION Left 03/14/2023   Procedure: LEFT BELOW KNEE AMPUTATION;  Surgeon: [name], MD;  Location: [facility] OR;  Service: Orthopedics;  Laterality: Left;   APPLICATION OF WOUND VAC Right 03/17/2020   Procedure: APPLICATION OF WOUND VAC;  Surgeon: [name], MD;  Location: [facility] OR;  Service: Orthopedics;  Laterality: Right;   APPLICATION OF WOUND VAC Left 03/14/2023   Procedure: APPLICATION OF WOUND VAC;  Surgeon: [name], MD;  Location: [facility] OR;  Service: Orthopedics;  Laterality: Left;   I & D EXTREMITY Right 01/30/2020   Procedure: DEBRIDEMENT ABSCESS RIGHT FOOT;  Surgeon: [name], MD;  Location: [facility] OR;  Service: Orthopedics;  Laterality: Right;   I & D EXTREMITY Right 04/07/2020   Procedure: PARTIAL TARSAL EXCISION RIGHT FOOT;  Surgeon: [name], MD;  Location: [facility] OR;  Service: Orthopedics;  Laterality: Right;   STUMP REVISION Left 08/16/2022   Procedure: REVISION LEFT TRANSMETATARSAL AMPUTATION;  Surgeon: [name], MD;  Location: [facility] OR;  Service: Orthopedics;  Laterality: Left;   Social History   Occupational History   Not on file  Tobacco Use   Smoking status: Former    Current packs/day: 0.00    Types: Cigarettes    Start date: 73    Quit date: 2007     Years since quitting: 17.9   Smokeless tobacco: Never   Tobacco comments:    03/16/20- quit 12- 15 years ago  Vaping Use   Vaping status: Never Used  Substance and Sexual Activity   Alcohol use: Yes    Alcohol/week: 6.0 - 8.0 standard drinks of alcohol    Types: 6 - 8 Cans of beer per week   Drug use: Never   Sexual activity: Yes

## 2023-04-04 ENCOUNTER — Ambulatory Visit (INDEPENDENT_AMBULATORY_CARE_PROVIDER_SITE_OTHER): Payer: Self-pay | Admitting: Family

## 2023-04-04 ENCOUNTER — Encounter: Payer: Self-pay | Admitting: Family

## 2023-04-04 DIAGNOSIS — Z89512 Acquired absence of left leg below knee: Secondary | ICD-10-CM

## 2023-04-04 DIAGNOSIS — S88112D Complete traumatic amputation at level between knee and ankle, left lower leg, subsequent encounter: Secondary | ICD-10-CM

## 2023-04-04 NOTE — Progress Notes (Signed)
Post-Op Visit Note   Patient: Jon House           Date of Birth: 1970-08-09           MRN: [id number] Visit Date: 04/04/2023 PCP: Patient, No Pcp Per  Chief Complaint:  Chief Complaint  Patient presents with   Left Leg - Routine Post Op    03/14/23 left BKA    HPI:  HPI The patient is a 52 year old gentleman who is seen status post left below-knee amputation.  Staples are in place.  Does report some heaviness and aching pain in his thigh and residual limb with prolonged weightbearing standing, time with the leg splint dependent.  Denies numbness tingling shooting pain no radicular pain. Ortho Exam On examination left residual limb this is healing well about two thirds of the staples harvested today.  There is some puckering of the incision will leave remaining staples in place there is no drainage or erythema  Visit Diagnoses: No diagnosis found.  Plan: Given an order for his prosthesis set up.  He will follow-up in 1 more week for remaining staple removal.  Follow-Up Instructions: No follow-ups on file.   Imaging: No results found.  Orders:  No orders of the defined types were placed in this encounter.  No orders of the defined types were placed in this encounter.    PMFS History: Patient Active Problem List   Diagnosis Date Noted   Protein-calorie malnutrition, severe 03/12/2023   Insulin dependent type 2 diabetes mellitus (HCC) 03/10/2023   Foot osteomyelitis, left (HCC) 03/10/2023   Normocytic anemia 03/10/2023   Dehiscence of amputation stump of left lower extremity (HCC) 08/16/2022   Subacute osteomyelitis, right ankle and foot (HCC)    Osteomyelitis of great toe of right foot (HCC)    History of amputation of lesser toe of left foot (HCC) 10/16/2018   Subacute osteomyelitis, left ankle and foot (HCC)    Diabetic infection of left foot (HCC) 10/04/2018   Diabetic infection of right foot (HCC) 10/04/2018   Diabetic polyneuropathy associated  with type 2 diabetes mellitus (HCC)    Past Medical History:  Diagnosis Date   Diabetes mellitus without complication (HCC)    Type II   Infection 10/04/2018   LEFT FOOT   Osteomyelitis of great toe of left foot (HCC) 07/29/2018   Subacute osteomyelitis, left ankle and foot (HCC)     Family History  Problem Relation Age of Onset   Diabetes Mother     Past Surgical History:  Procedure Laterality Date   AMPUTATION Left 07/31/2018   Procedure: LEFT GREAT TOE AMPUTATION;  Surgeon: [name], MD;  Location: [facility] OR;  Service: Orthopedics;  Laterality: Left;   AMPUTATION Left 10/09/2018   Procedure: LEFT FOOT 2ND TOE AND POSSIBLE 3RD TOE AMPUTATION;  Surgeon: [name], MD;  Location[facility] OR;  Service: Orthopedics;  Laterality: Left;   AMPUTATION Right 03/17/2020   Procedure: RIGHT GREAT TOE AMPUTATION THROUGH METATARSAL;  Surgeon: [name], MD;  Location: [facility] OR;  Service: Orthopedics;  Laterality: Right;   AMPUTATION Left 10/22/2020   Procedure: LEFT TRANSMETATARSAL AMPUTATION;  Surgeon: [name], MD;  Location: [facility] OR;  Service: Orthopedics;  Laterality: Left;   AMPUTATION Left 03/14/2023   Procedure: LEFT BELOW KNEE AMPUTATION;  Surgeon: [name], MD;  Location: [facility] OR;  Service: Orthopedics;  Laterality: Left;   APPLICATION OF WOUND VAC Right 03/17/2020   Procedure: APPLICATION OF WOUND VAC;  Surgeon: Aldean Baker  V, MD;  Location: [facility] OR;  Service: Orthopedics;  Laterality: Right;   APPLICATION OF WOUND VAC Left 03/14/2023   Procedure: APPLICATION OF WOUND VAC;  Surgeon: [name], MD;  Location: [facility] OR;  Service: Orthopedics;  Laterality: Left;   I & D EXTREMITY Right 01/30/2020   Procedure: DEBRIDEMENT ABSCESS RIGHT FOOT;  Surgeon: [name], MD;  Location: [facility] OR;  Service: Orthopedics;  Laterality: Right;   I & D EXTREMITY Right 04/07/2020   Procedure: PARTIAL TARSAL EXCISION RIGHT FOOT;  Surgeon: [name], MD;  Location: [facility] OR;  Service:  Orthopedics;  Laterality: Right;   STUMP REVISION Left 08/16/2022   Procedure: REVISION LEFT TRANSMETATARSAL AMPUTATION;  Surgeon: [name], MD;  Location: [facility] OR;  Service: Orthopedics;  Laterality: Left;   Social History   Occupational History   Not on file  Tobacco Use   Smoking status: Former    Current packs/day: 0.00    Types: Cigarettes    Start date: 36    Quit date: 2007    Years since quitting: 17.9   Smokeless tobacco: Never   Tobacco comments:    03/16/20- quit 12- 15 years ago  Vaping Use   Vaping status: Never Used  Substance and Sexual Activity   Alcohol use: Yes    Alcohol/week: 6.0 - 8.0 standard drinks of alcohol    Types: 6 - 8 Cans of beer per week   Drug use: Never   Sexual activity: Yes

## 2023-04-10 ENCOUNTER — Encounter: Payer: No Typology Code available for payment source | Admitting: Student

## 2023-04-30 ENCOUNTER — Ambulatory Visit (INDEPENDENT_AMBULATORY_CARE_PROVIDER_SITE_OTHER): Payer: Self-pay | Admitting: Orthopedic Surgery

## 2023-04-30 ENCOUNTER — Encounter: Payer: Self-pay | Admitting: Orthopedic Surgery

## 2023-04-30 DIAGNOSIS — S88112D Complete traumatic amputation at level between knee and ankle, left lower leg, subsequent encounter: Secondary | ICD-10-CM

## 2023-04-30 NOTE — Progress Notes (Signed)
Office Visit Note   Patient: Jon House           Date of Birth: 04-20-1971           MRN: [id number] Visit Date: 04/30/2023              Requested by: No referring provider defined for this encounter. PCP: Patient, No Pcp Per  Chief Complaint  Patient presents with   Left Knee - Follow-up    11/13(/2024 Left BKA      HPI: Patient is a 52 year old gentleman who is status post left below-knee amputation approximately 6 weeks out.  Assessment & Plan: Visit Diagnoses:  1. Below-knee amputation of left lower extremity, subsequent encounter [facility])     Plan: The incision is well-healed he has an appointment with [facility] next week to be fit for his prosthesis.  Follow-up after he is ambulating in the prosthesis.  Follow-Up Instructions: Return in about 2 months (around 06/29/2023).   Ortho Exam  Patient is alert, oriented, no adenopathy, well-dressed, normal affect, normal respiratory effort. Examination the residual limb is well-healed he has full extension.  No open wounds or ulcers.  Patient states he has been having some muscle cramps and recommended electrolyte replacement daily.  Imaging: No results found. No images are attached to the encounter.  Labs: Lab Results  Component Value Date   HGBA1C 10.6 (H) 03/14/2023   HGBA1C 10.3 (H) 03/11/2023   HGBA1C 10.2 (A) 07/07/2019   ESRSEDRATE 130 (H) 03/11/2023   ESRSEDRATE 3 07/07/2019   ESRSEDRATE 69 (H) 10/06/2018   CRP 8.8 (H) 03/15/2023   CRP 25.8 (H) 03/11/2023   CRP 13.7 (H) 10/06/2018   REPTSTATUS 03/15/2023 FINAL 03/10/2023   GRAMSTAIN  01/30/2020    RARE WBC PRESENT, PREDOMINANTLY PMN RARE GRAM POSITIVE COCCI    CULT  03/10/2023    NO GROWTH 5 DAYS Performed at [facility], [address], [place], [place] [address]    LABORGA ENTEROBACTER CLOACAE 01/30/2020   LABORGA ENTEROCOCCUS FAECALIS 01/30/2020     Lab Results  Component Value Date   ALBUMIN 3.0 (L) 03/10/2023   ALBUMIN 3.9  10/03/2018   ALBUMIN 4.7 08/05/2018   PREALBUMIN 8 (L) 03/14/2023   PREALBUMIN <5 (L) 03/11/2023   PREALBUMIN 9.6 (L) 10/04/2018    Lab Results  Component Value Date   MG 2.0 03/15/2023   No results found for: "VD25OH"  Lab Results  Component Value Date   PREALBUMIN 8 (L) 03/14/2023   PREALBUMIN <5 (L) 03/11/2023   PREALBUMIN 9.6 (L) 10/04/2018      Latest Ref Rng & Units 03/15/2023    9:56 AM 03/14/2023    5:34 AM 03/13/2023    6:36 AM  CBC EXTENDED  WBC 4.0 - 10.5 K/uL 12.0  6.9  6.1   RBC 4.22 - 5.81 MIL/uL 3.76  3.96  3.61   Hemoglobin 13.0 - 17.0 g/dL 13.2  44.0  10.2   HCT 39.0 - 52.0 % 32.6  34.2  31.1   Platelets 150 - 400 K/uL 430  392  355   NEUT# 1.7 - 7.7 K/uL 10.4     Lymph# 0.7 - 4.0 K/uL 0.8        There is no height or weight on file to calculate BMI.  Orders:  No orders of the defined types were placed in this encounter.  No orders of the defined types were placed in this encounter.    Procedures: No procedures performed  Clinical  Data: No additional findings.  ROS:  All other systems negative, except as noted in the HPI. Review of Systems  Objective: Vital Signs: There were no vitals taken for this visit.  Specialty Comments:  No specialty comments available.  PMFS History: Patient Active Problem List   Diagnosis Date Noted   Protein-calorie malnutrition, severe 03/12/2023   Insulin dependent type 2 diabetes mellitus (HCC) 03/10/2023   Foot osteomyelitis, left (HCC) 03/10/2023   Normocytic anemia 03/10/2023   Dehiscence of amputation stump of left lower extremity (HCC) 08/16/2022   Subacute osteomyelitis, right ankle and foot (HCC)    Osteomyelitis of great toe of right foot (HCC)    History of amputation of lesser toe of left foot (HCC) 10/16/2018   Subacute osteomyelitis, left ankle and foot (HCC)    Diabetic infection of left foot (HCC) 10/04/2018   Diabetic infection of right foot (HCC) 10/04/2018   Diabetic  polyneuropathy associated with type 2 diabetes mellitus (HCC)    Past Medical History:  Diagnosis Date   Diabetes mellitus without complication (HCC)    Type II   Infection 10/04/2018   LEFT FOOT   Osteomyelitis of great toe of left foot (HCC) 07/29/2018   Subacute osteomyelitis, left ankle and foot (HCC)     Family History  Problem Relation Age of Onset   Diabetes Mother     Past Surgical History:  Procedure Laterality Date   AMPUTATION Left 07/31/2018   Procedure: LEFT GREAT TOE AMPUTATION;  Surgeon: [name], MD;  Location: [facility] OR;  Service: Orthopedics;  Laterality: Left;   AMPUTATION Left 10/09/2018   Procedure: LEFT FOOT 2ND TOE AND POSSIBLE 3RD TOE AMPUTATION;  Surgeon: [name], MD;  Location: [facility] OR;  Service: Orthopedics;  Laterality: Left;   AMPUTATION Right 03/17/2020   Procedure: RIGHT GREAT TOE AMPUTATION THROUGH METATARSAL;  Surgeon: [name], MD;  Location: [facility] OR;  Service: Orthopedics;  Laterality: Right;   AMPUTATION Left 10/22/2020   Procedure: LEFT TRANSMETATARSAL AMPUTATION;  Surgeon: [name], MD;  Location: [facility] OR;  Service: Orthopedics;  Laterality: Left;   AMPUTATION Left 03/14/2023   Procedure: LEFT BELOW KNEE AMPUTATION;  Surgeon: [name], MD;  Location: [facility] OR;  Service: Orthopedics;  Laterality: Left;   APPLICATION OF WOUND VAC Right 03/17/2020   Procedure: APPLICATION OF WOUND VAC;  Surgeon: [name], MD;  Location: [facility] OR;  Service: Orthopedics;  Laterality: Right;   APPLICATION OF WOUND VAC Left 03/14/2023   Procedure: APPLICATION OF WOUND VAC;  Surgeon: [name], MD;  Location: [facility] OR;  Service: Orthopedics;  Laterality: Left;   I & D EXTREMITY Right 01/30/2020   Procedure: DEBRIDEMENT ABSCESS RIGHT FOOT;  Surgeon: [name], MD;  Location: [facility] OR;  Service: Orthopedics;  Laterality: Right;   I & D EXTREMITY Right 04/07/2020   Procedure: PARTIAL TARSAL EXCISION RIGHT FOOT;  Surgeon: [name], MD;  Location: [facility]  OR;  Service: Orthopedics;  Laterality: Right;   STUMP REVISION Left 08/16/2022   Procedure: REVISION LEFT TRANSMETATARSAL AMPUTATION;  Surgeon: [name], MD;  Location: [facility] OR;  Service: Orthopedics;  Laterality: Left;   Social History   Occupational History   Not on file  Tobacco Use   Smoking status: Former    Current packs/day: 0.00    Types: Cigarettes    Start date: 22    Quit date: 2007    Years since quitting: 18.0   Smokeless tobacco: Never  Tobacco comments:    03/16/20- quit 12- 15 years ago  Vaping Use   Vaping status: Never Used  Substance and Sexual Activity   Alcohol use: Yes    Alcohol/week: 6.0 - 8.0 standard drinks of alcohol    Types: 6 - 8 Cans of beer per week   Drug use: Never   Sexual activity: Yes

## 2023-07-25 ENCOUNTER — Ambulatory Visit (INDEPENDENT_AMBULATORY_CARE_PROVIDER_SITE_OTHER): Payer: Self-pay | Admitting: Family

## 2023-07-25 ENCOUNTER — Encounter: Payer: Self-pay | Admitting: Family

## 2023-07-25 DIAGNOSIS — S88112D Complete traumatic amputation at level between knee and ankle, left lower leg, subsequent encounter: Secondary | ICD-10-CM

## 2023-07-25 DIAGNOSIS — Z89512 Acquired absence of left leg below knee: Secondary | ICD-10-CM

## 2023-07-25 NOTE — Progress Notes (Signed)
 Post-Op Visit Note   Patient: Jon House           Date of Birth: 1970/05/02           MRN: [id number] Visit Date: 07/25/2023 PCP: Patient, No Pcp Per  Chief Complaint: No chief complaint on file.   HPI:  HPI The patient is a 53 year old gentleman who presents in follow-up he has recently been set up with his left prosthesis he is status post below-knee amputation.  He ate had his prosthesis for about a month he does some have some pain beneath his knee when he must a send a hill with his prosthesis but overall feels he is doing well.  Does have some phantom pains in his distal residual limb at night however he would not like to try any medication for this Ortho Exam On examination left residual limb this is well consolidated well-healed there is no open areas no impending ulceration  Visit Diagnoses: No diagnosis found.  Plan: Follow-up in the office as needed.  He reports his next [employer] adjustment is mid April of this year.  Follow-Up Instructions: No follow-ups on file.   Imaging: No results found.  Orders:  No orders of the defined types were placed in this encounter.  No orders of the defined types were placed in this encounter.    PMFS History: Patient Active Problem List   Diagnosis Date Noted   Protein-calorie malnutrition, severe 03/12/2023   Insulin dependent type 2 diabetes mellitus (HCC) 03/10/2023   Foot osteomyelitis, left (HCC) 03/10/2023   Normocytic anemia 03/10/2023   Dehiscence of amputation stump of left lower extremity (HCC) 08/16/2022   Subacute osteomyelitis, right ankle and foot (HCC)    Osteomyelitis of great toe of right foot (HCC)    History of amputation of lesser toe of left foot (HCC) 10/16/2018   Subacute osteomyelitis, left ankle and foot (HCC)    Diabetic infection of left foot (HCC) 10/04/2018   Diabetic infection of right foot (HCC) 10/04/2018   Diabetic polyneuropathy associated with type 2 diabetes mellitus (HCC)     Past Medical History:  Diagnosis Date   Diabetes mellitus without complication (HCC)    Type II   Infection 10/04/2018   LEFT FOOT   Osteomyelitis of great toe of left foot (HCC) 07/29/2018   Subacute osteomyelitis, left ankle and foot (HCC)     Family History  Problem Relation Age of Onset   Diabetes Mother     Past Surgical History:  Procedure Laterality Date   AMPUTATION Left 07/31/2018   Procedure: LEFT GREAT TOE AMPUTATION;  Surgeon: [name], MD;  Location: [facility] OR;  Service: Orthopedics;  Laterality: Left;   AMPUTATION Left 10/09/2018   Procedure: LEFT FOOT 2ND TOE AND POSSIBLE 3RD TOE AMPUTATION;  Surgeon: [name], MD;  Location: [facility] OR;  Service: Orthopedics;  Laterality: Left;   AMPUTATION Right 03/17/2020   Procedure: RIGHT GREAT TOE AMPUTATION THROUGH METATARSAL;  Surgeon: [name], MD;  Location: [facility] OR;  Service: Orthopedics;  Laterality: Right;   AMPUTATION Left 10/22/2020   Procedure: LEFT TRANSMETATARSAL AMPUTATION;  Surgeon: [name], MD;  Location: [facility] OR;  Service: Orthopedics;  Laterality: Left;   AMPUTATION Left 03/14/2023   Procedure: LEFT BELOW KNEE AMPUTATION;  Surgeon: [name], MD;  Location: [facility] OR;  Service: Orthopedics;  Laterality: Left;   APPLICATION OF WOUND VAC Right 03/17/2020   Procedure: APPLICATION OF WOUND VAC;  Surgeon: [name], MD;  Location: [facility] OR;  Service: Orthopedics;  Laterality: Right;   APPLICATION OF WOUND VAC Left 03/14/2023   Procedure: APPLICATION OF WOUND VAC;  Surgeon: [name], MD;  Location: [facility] OR;  Service: Orthopedics;  Laterality: Left;   I & D EXTREMITY Right 01/30/2020   Procedure: DEBRIDEMENT ABSCESS RIGHT FOOT;  Surgeon: [name], MD;  Location: [facility] OR;  Service: Orthopedics;  Laterality: Right;   I & D EXTREMITY Right 04/07/2020   Procedure: PARTIAL TARSAL EXCISION RIGHT FOOT;  Surgeon: [name], MD;  Location: [facility] OR;  Service: Orthopedics;  Laterality: Right;   STUMP  REVISION Left 08/16/2022   Procedure: REVISION LEFT TRANSMETATARSAL AMPUTATION;  Surgeon: [name], MD;  Location: [facility] OR;  Service: Orthopedics;  Laterality: Left;   Social History   Occupational History   Not on file  Tobacco Use   Smoking status: Former    Current packs/day: 0.00    Types: Cigarettes    Start date: 29    Quit date: 2007    Years since quitting: 18.2   Smokeless tobacco: Never   Tobacco comments:    03/16/20- quit 12- 15 years ago  Vaping Use   Vaping status: Never Used  Substance and Sexual Activity   Alcohol use: Yes    Alcohol/week: 6.0 - 8.0 standard drinks of alcohol    Types: 6 - 8 Cans of beer per week   Drug use: Never   Sexual activity: Yes

## 2024-02-11 ENCOUNTER — Ambulatory Visit (INDEPENDENT_AMBULATORY_CARE_PROVIDER_SITE_OTHER): Payer: Self-pay | Admitting: Orthopedic Surgery

## 2024-02-11 DIAGNOSIS — S88112D Complete traumatic amputation at level between knee and ankle, left lower leg, subsequent encounter: Secondary | ICD-10-CM

## 2024-02-11 DIAGNOSIS — Z89512 Acquired absence of left leg below knee: Secondary | ICD-10-CM

## 2024-02-12 ENCOUNTER — Encounter: Payer: Self-pay | Admitting: Orthopedic Surgery

## 2024-02-12 NOTE — Progress Notes (Signed)
 Office Visit Note   Patient: Jon House           Date of Birth: 06-Oct-1970           MRN: [id number] Visit Date: 02/11/2024              Requested by: No referring provider defined for this encounter. PCP: Patient, No Pcp Per  Chief Complaint  Patient presents with   Left Leg - Follow-up    HX BKA 03/14/2023      HPI: Discussed the use of AI scribe software for clinical note transcription with the patient, who gave verbal consent to proceed.  History of Present Illness Jon House is a 53 year old male who presents with issues related to his prosthetic leg.  He has been experiencing difficulties with his prosthetic leg, which he has had for nine months following surgery eleven months ago. The prosthetic leg is too short, affecting his ability to walk, especially on uneven surfaces or slopes. He can walk on flat ground but struggles with inclines.  The heel of the prosthetic is too low, impacting his stability and walking. He does not feel stable with the foot and ankle of the prosthesis.  He has an upcoming appointment on November 2nd.     Assessment & Plan: Visit Diagnoses:  1. Below-knee amputation of left lower extremity, subsequent encounter     Plan: Assessment and Plan Assessment & Plan Right below-knee amputation with prosthesis fitting and functional issues Decreased residual limb volume causing socket subsidence and instability. Prosthesis too short, affecting ambulation and stability. New socket required due to limb shrinkage. - Prescribed new foot and ankle, socket liner, and supplies. - Advised appointment with prosthetist on November 2nd for new prosthesis fitting.      Follow-Up Instructions: Return if symptoms worsen or fail to improve.   Ortho Exam  Patient is alert, oriented, no adenopathy, well-dressed, normal affect, normal respiratory effort. Physical Exam EXTREMITIES: Decreased volume in the residual limb with subsidence  into the socket. Lack of rotational stability, leg shortening, and difficulty with ambulation.  Patient has an antalgic gait with pelvic tilt with ambulation.  Patient is an existing left transtibial  amputee.  Patient's current comorbidities are not expected to impact the ability to function with the prescribed prosthesis. Patient verbally communicates a strong desire to use a prosthesis. Patient currently requires mobility aids to ambulate without a prosthesis.  Expects not to use mobility aids with a new prosthesis. Patient is expected to resume or reach their K Level within 6 months. Patient was active before the amputation and independent with stairs, uneven terrain, varying cadence, and a community ambulator.  Patient is a K3 level ambulator that spends a lot of time walking around on uneven terrain over obstacles, up and down stairs, and ambulates with a variable cadence.         Imaging: No results found. No images are attached to the encounter.  Labs: Lab Results  Component Value Date   HGBA1C 10.6 (H) 03/14/2023   HGBA1C 10.3 (H) 03/11/2023   HGBA1C 10.2 (A) 07/07/2019   ESRSEDRATE 130 (H) 03/11/2023   ESRSEDRATE 3 07/07/2019   ESRSEDRATE 69 (H) 10/06/2018   CRP 8.8 (H) 03/15/2023   CRP 25.8 (H) 03/11/2023   CRP 13.7 (H) 10/06/2018   REPTSTATUS 03/15/2023 FINAL 03/10/2023   GRAMSTAIN  01/30/2020    RARE WBC PRESENT, PREDOMINANTLY PMN RARE GRAM POSITIVE COCCI    CULT  03/10/2023    NO  GROWTH 5 DAYS Performed at [facility], [address], [place], [place] [address]    LABORGA ENTEROBACTER CLOACAE 01/30/2020   LABORGA ENTEROCOCCUS FAECALIS 01/30/2020     Lab Results  Component Value Date   ALBUMIN 3.0 (L) 03/10/2023   ALBUMIN 3.9 10/03/2018   ALBUMIN 4.7 08/05/2018   PREALBUMIN 8 (L) 03/14/2023   PREALBUMIN <5 (L) 03/11/2023   PREALBUMIN 9.6 (L) 10/04/2018    Lab Results  Component Value Date   MG 2.0 03/15/2023   No results found for:  [facility]  Lab Results  Component Value Date   PREALBUMIN 8 (L) 03/14/2023   PREALBUMIN <5 (L) 03/11/2023   PREALBUMIN 9.6 (L) 10/04/2018      Latest Ref Rng & Units 03/15/2023    9:56 AM 03/14/2023    5:34 AM 03/13/2023    6:36 AM  CBC EXTENDED  WBC 4.0 - 10.5 K/uL 12.0  6.9  6.1   RBC 4.22 - 5.81 MIL/uL 3.76  3.96  3.61   Hemoglobin 13.0 - 17.0 g/dL 89.0  88.7  89.2   HCT 39.0 - 52.0 % 32.6  34.2  31.1   Platelets 150 - 400 K/uL 430  392  355   NEUT# 1.7 - 7.7 K/uL 10.4     Lymph# 0.7 - 4.0 K/uL 0.8        There is no height or weight on file to calculate BMI.  Orders:  No orders of the defined types were placed in this encounter.  No orders of the defined types were placed in this encounter.    Procedures: No procedures performed  Clinical Data: No additional findings.  ROS:  All other systems negative, except as noted in the HPI. Review of Systems  Objective: Vital Signs: There were no vitals taken for this visit.  Specialty Comments:  No specialty comments available.  PMFS History: Patient Active Problem List   Diagnosis Date Noted   Protein-calorie malnutrition, severe 03/12/2023   Insulin  dependent type 2 diabetes mellitus (HCC) 03/10/2023   Foot osteomyelitis, left (HCC) 03/10/2023   Normocytic anemia 03/10/2023   Dehiscence of amputation stump of left lower extremity (HCC) 08/16/2022   Subacute osteomyelitis, right ankle and foot (HCC)    Osteomyelitis of great toe of right foot (HCC)    History of amputation of lesser toe of left foot 10/16/2018   Subacute osteomyelitis, left ankle and foot (HCC)    Diabetic infection of left foot (HCC) 10/04/2018   Diabetic infection of right foot (HCC) 10/04/2018   Diabetic polyneuropathy associated with type 2 diabetes mellitus (HCC)    Past Medical History:  Diagnosis Date   Diabetes mellitus without complication (HCC)    Type II   Infection 10/04/2018   LEFT FOOT   Osteomyelitis of great toe of left  foot (HCC) 07/29/2018   Subacute osteomyelitis, left ankle and foot (HCC)     Family History  Problem Relation Age of Onset   Diabetes Mother     Past Surgical History:  Procedure Laterality Date   AMPUTATION Left 07/31/2018   Procedure: LEFT GREAT TOE AMPUTATION;  Surgeon: Harden Jerona GAILS, MD;  Location: [facility] OR;  Service: Orthopedics;  Laterality: Left;   AMPUTATION Left 10/09/2018   Procedure: LEFT FOOT 2ND TOE AND POSSIBLE 3RD TOE AMPUTATION;  Surgeon: Harden Jerona GAILS, MD;  Location: [facility] OR;  Service: Orthopedics;  Laterality: Left;   AMPUTATION Right 03/17/2020   Procedure: RIGHT GREAT TOE AMPUTATION THROUGH METATARSAL;  Surgeon: Harden,  Jerona GAILS, MD;  Location: [facility] OR;  Service: Orthopedics;  Laterality: Right;   AMPUTATION Left 10/22/2020   Procedure: LEFT TRANSMETATARSAL AMPUTATION;  Surgeon: Harden Jerona GAILS, MD;  Location: [facility] OR;  Service: Orthopedics;  Laterality: Left;   AMPUTATION Left 03/14/2023   Procedure: LEFT BELOW KNEE AMPUTATION;  Surgeon: Harden Jerona GAILS, MD;  Location: [facility] OR;  Service: Orthopedics;  Laterality: Left;   APPLICATION OF WOUND VAC Right 03/17/2020   Procedure: APPLICATION OF WOUND VAC;  Surgeon: Harden Jerona GAILS, MD;  Location: [facility] OR;  Service: Orthopedics;  Laterality: Right;   APPLICATION OF WOUND VAC Left 03/14/2023   Procedure: APPLICATION OF WOUND VAC;  Surgeon: Harden Jerona GAILS, MD;  Location: [facility] OR;  Service: Orthopedics;  Laterality: Left;   I & D EXTREMITY Right 01/30/2020   Procedure: DEBRIDEMENT ABSCESS RIGHT FOOT;  Surgeon: Harden Jerona GAILS, MD;  Location: [facility] OR;  Service: Orthopedics;  Laterality: Right;   I & D EXTREMITY Right 04/07/2020   Procedure: PARTIAL TARSAL EXCISION RIGHT FOOT;  Surgeon: Harden Jerona GAILS, MD;  Location: [facility] OR;  Service: Orthopedics;  Laterality: Right;   STUMP REVISION Left 08/16/2022   Procedure: REVISION LEFT TRANSMETATARSAL AMPUTATION;  Surgeon: Harden Jerona GAILS, MD;  Location: [facility] OR;  Service: Orthopedics;  Laterality: Left;   Social  History   Occupational History   Not on file  Tobacco Use   Smoking status: Former    Current packs/day: 0.00    Types: Cigarettes    Start date: 19    Quit date: 2007    Years since quitting: 18.7   Smokeless tobacco: Never   Tobacco comments:    03/16/20- quit 12- 15 years ago  Vaping Use   Vaping status: Never Used  Substance and Sexual Activity   Alcohol use: Yes    Alcohol/week: 6.0 - 8.0 standard drinks of alcohol    Types: 6 - 8 Cans of beer per week   Drug use: Never   Sexual activity: Yes
